# Patient Record
Sex: Female | Born: 1950
Health system: Southern US, Community
[De-identification: ages and names within clinical notes are randomized; demographics above are authoritative.]

## PROBLEM LIST (undated history)

## (undated) DIAGNOSIS — R32 Unspecified urinary incontinence: Secondary | ICD-10-CM

## (undated) DIAGNOSIS — J45909 Unspecified asthma, uncomplicated: Secondary | ICD-10-CM

## (undated) DIAGNOSIS — E559 Vitamin D deficiency, unspecified: Secondary | ICD-10-CM

## (undated) DIAGNOSIS — K219 Gastro-esophageal reflux disease without esophagitis: Secondary | ICD-10-CM

## (undated) DIAGNOSIS — K5790 Diverticulosis of intestine, part unspecified, without perforation or abscess without bleeding: Secondary | ICD-10-CM

## (undated) DIAGNOSIS — T7840XA Allergy, unspecified, initial encounter: Secondary | ICD-10-CM

## (undated) DIAGNOSIS — G459 Transient cerebral ischemic attack, unspecified: Secondary | ICD-10-CM

## (undated) DIAGNOSIS — I10 Essential (primary) hypertension: Secondary | ICD-10-CM

## (undated) DIAGNOSIS — K760 Fatty (change of) liver, not elsewhere classified: Secondary | ICD-10-CM

## (undated) DIAGNOSIS — I4891 Unspecified atrial fibrillation: Secondary | ICD-10-CM

## (undated) DIAGNOSIS — M1990 Unspecified osteoarthritis, unspecified site: Secondary | ICD-10-CM

## (undated) DIAGNOSIS — E079 Disorder of thyroid, unspecified: Secondary | ICD-10-CM

## (undated) DIAGNOSIS — M5416 Radiculopathy, lumbar region: Secondary | ICD-10-CM

## (undated) HISTORY — PX: RIGHT HEART CATH: SHX6075

## (undated) HISTORY — DX: Transient cerebral ischemic attack, unspecified: G45.9

## (undated) HISTORY — DX: Gastro-esophageal reflux disease without esophagitis: K21.9

## (undated) HISTORY — DX: Fatty (change of) liver, not elsewhere classified: K76.0

## (undated) HISTORY — PX: CHOLECYSTECTOMY: SHX55

## (undated) HISTORY — PX: CARPAL TUNNEL RELEASE: SHX101

## (undated) HISTORY — DX: Disorder of thyroid, unspecified: E07.9

## (undated) HISTORY — PX: ABLATION: SHX5711

## (undated) HISTORY — DX: Unspecified osteoarthritis, unspecified site: M19.90

## (undated) HISTORY — DX: Allergy, unspecified, initial encounter: T78.40XA

## (undated) HISTORY — PX: OTHER SURGICAL HISTORY: SHX169

## (undated) HISTORY — PX: TUBAL LIGATION: SHX77

## (undated) HISTORY — DX: Vitamin D deficiency, unspecified: E55.9

## (undated) HISTORY — DX: Diverticulosis of intestine, part unspecified, without perforation or abscess without bleeding: K57.90

## (undated) HISTORY — DX: Unspecified urinary incontinence: R32

## (undated) HISTORY — DX: Unspecified atrial fibrillation: I48.91

## (undated) HISTORY — PX: VAGINAL HYSTERECTOMY: SUR661

## (undated) HISTORY — DX: Unspecified asthma, uncomplicated: J45.909

## (undated) HISTORY — DX: Radiculopathy, lumbar region: M54.16

---

## 2000-06-30 ENCOUNTER — Emergency Department (HOSPITAL_COMMUNITY): Admission: EM | Admit: 2000-06-30 | Discharge: 2000-06-30 | Payer: Self-pay | Admitting: Emergency Medicine

## 2000-08-29 ENCOUNTER — Encounter (INDEPENDENT_AMBULATORY_CARE_PROVIDER_SITE_OTHER): Payer: Self-pay | Admitting: Family Medicine

## 2000-11-27 ENCOUNTER — Emergency Department (HOSPITAL_COMMUNITY): Admission: EM | Admit: 2000-11-27 | Discharge: 2000-11-27 | Payer: Self-pay | Admitting: *Deleted

## 2001-04-27 ENCOUNTER — Emergency Department (HOSPITAL_COMMUNITY): Admission: EM | Admit: 2001-04-27 | Discharge: 2001-04-27 | Payer: Self-pay

## 2002-03-28 ENCOUNTER — Ambulatory Visit (HOSPITAL_COMMUNITY): Admission: RE | Admit: 2002-03-28 | Discharge: 2002-03-28 | Payer: Self-pay | Admitting: Family Medicine

## 2002-03-28 ENCOUNTER — Encounter: Payer: Self-pay | Admitting: Family Medicine

## 2003-06-06 ENCOUNTER — Ambulatory Visit (HOSPITAL_COMMUNITY): Admission: RE | Admit: 2003-06-06 | Discharge: 2003-06-06 | Payer: Self-pay | Admitting: Family Medicine

## 2003-06-06 ENCOUNTER — Encounter: Payer: Self-pay | Admitting: Family Medicine

## 2003-08-05 ENCOUNTER — Emergency Department (HOSPITAL_COMMUNITY): Admission: EM | Admit: 2003-08-05 | Discharge: 2003-08-05 | Payer: Self-pay | Admitting: Emergency Medicine

## 2003-10-07 ENCOUNTER — Emergency Department (HOSPITAL_COMMUNITY): Admission: EM | Admit: 2003-10-07 | Discharge: 2003-10-07 | Payer: Self-pay | Admitting: Emergency Medicine

## 2003-11-08 ENCOUNTER — Inpatient Hospital Stay (HOSPITAL_COMMUNITY): Admission: EM | Admit: 2003-11-08 | Discharge: 2003-11-11 | Payer: Self-pay | Admitting: Emergency Medicine

## 2003-11-10 ENCOUNTER — Encounter: Payer: Self-pay | Admitting: Internal Medicine

## 2004-01-07 ENCOUNTER — Ambulatory Visit (HOSPITAL_COMMUNITY): Admission: RE | Admit: 2004-01-07 | Discharge: 2004-01-07 | Payer: Self-pay | Admitting: Family Medicine

## 2004-04-29 ENCOUNTER — Ambulatory Visit: Payer: Self-pay | Admitting: Internal Medicine

## 2004-05-20 ENCOUNTER — Ambulatory Visit: Payer: Self-pay | Admitting: Internal Medicine

## 2004-05-21 ENCOUNTER — Ambulatory Visit: Payer: Self-pay | Admitting: *Deleted

## 2004-06-25 ENCOUNTER — Ambulatory Visit: Payer: Self-pay | Admitting: Family Medicine

## 2004-09-04 ENCOUNTER — Ambulatory Visit: Payer: Self-pay | Admitting: Internal Medicine

## 2004-11-12 ENCOUNTER — Ambulatory Visit: Payer: Self-pay | Admitting: Internal Medicine

## 2004-11-18 ENCOUNTER — Ambulatory Visit: Payer: Self-pay | Admitting: Internal Medicine

## 2004-12-09 ENCOUNTER — Ambulatory Visit (HOSPITAL_COMMUNITY): Admission: RE | Admit: 2004-12-09 | Discharge: 2004-12-09 | Payer: Self-pay | Admitting: Family Medicine

## 2004-12-09 ENCOUNTER — Emergency Department (HOSPITAL_COMMUNITY): Admission: EM | Admit: 2004-12-09 | Discharge: 2004-12-09 | Payer: Self-pay | Admitting: Family Medicine

## 2006-03-01 ENCOUNTER — Emergency Department (HOSPITAL_COMMUNITY): Admission: EM | Admit: 2006-03-01 | Discharge: 2006-03-02 | Payer: Self-pay | Admitting: Internal Medicine

## 2006-12-15 ENCOUNTER — Ambulatory Visit: Payer: Self-pay | Admitting: Internal Medicine

## 2006-12-26 ENCOUNTER — Ambulatory Visit: Payer: Self-pay | Admitting: Internal Medicine

## 2007-02-01 ENCOUNTER — Ambulatory Visit: Payer: Self-pay | Admitting: Family Medicine

## 2007-02-08 ENCOUNTER — Emergency Department (HOSPITAL_COMMUNITY): Admission: EM | Admit: 2007-02-08 | Discharge: 2007-02-08 | Payer: Self-pay | Admitting: Emergency Medicine

## 2007-02-09 ENCOUNTER — Ambulatory Visit: Payer: Self-pay | Admitting: Family Medicine

## 2007-02-10 ENCOUNTER — Ambulatory Visit (HOSPITAL_COMMUNITY): Admission: RE | Admit: 2007-02-10 | Discharge: 2007-02-10 | Payer: Self-pay | Admitting: Family Medicine

## 2007-02-24 ENCOUNTER — Encounter: Admission: RE | Admit: 2007-02-24 | Discharge: 2007-02-24 | Payer: Self-pay | Admitting: Family Medicine

## 2007-05-03 ENCOUNTER — Encounter (INDEPENDENT_AMBULATORY_CARE_PROVIDER_SITE_OTHER): Payer: Self-pay | Admitting: *Deleted

## 2007-09-25 ENCOUNTER — Ambulatory Visit: Payer: Self-pay | Admitting: Nurse Practitioner

## 2007-09-25 DIAGNOSIS — I10 Essential (primary) hypertension: Secondary | ICD-10-CM | POA: Insufficient documentation

## 2007-09-25 DIAGNOSIS — IMO0002 Reserved for concepts with insufficient information to code with codable children: Secondary | ICD-10-CM | POA: Insufficient documentation

## 2007-09-25 DIAGNOSIS — R609 Edema, unspecified: Secondary | ICD-10-CM | POA: Insufficient documentation

## 2007-09-25 LAB — CONVERTED CEMR LAB
Albumin: 4.3 g/dL (ref 3.5–5.2)
CO2: 28 meq/L (ref 19–32)
Calcium: 9.3 mg/dL (ref 8.4–10.5)
Creatinine, Ser: 0.75 mg/dL (ref 0.40–1.20)
Glucose, Bld: 96 mg/dL (ref 70–99)
HCT: 38.1 % (ref 36.0–46.0)
Lymphocytes Relative: 28 % (ref 12–46)
Lymphs Abs: 1.8 10*3/uL (ref 0.7–4.0)
Monocytes Absolute: 0.4 10*3/uL (ref 0.1–1.0)
Monocytes Relative: 7 % (ref 3–12)
Neutro Abs: 4 10*3/uL (ref 1.7–7.7)
Neutrophils Relative %: 63 % (ref 43–77)
Platelets: 216 10*3/uL (ref 150–400)
Total Bilirubin: 1.1 mg/dL (ref 0.3–1.2)

## 2007-09-26 ENCOUNTER — Encounter (INDEPENDENT_AMBULATORY_CARE_PROVIDER_SITE_OTHER): Payer: Self-pay | Admitting: Nurse Practitioner

## 2007-10-10 ENCOUNTER — Ambulatory Visit: Payer: Self-pay | Admitting: Nurse Practitioner

## 2007-12-25 ENCOUNTER — Emergency Department (HOSPITAL_COMMUNITY): Admission: EM | Admit: 2007-12-25 | Discharge: 2007-12-25 | Payer: Self-pay | Admitting: Family Medicine

## 2008-04-16 ENCOUNTER — Ambulatory Visit: Payer: Self-pay | Admitting: Family Medicine

## 2008-04-16 DIAGNOSIS — N3946 Mixed incontinence: Secondary | ICD-10-CM | POA: Insufficient documentation

## 2008-04-16 LAB — CONVERTED CEMR LAB
HDL: 53 mg/dL (ref >39)
Ketones, urine, test strip: NEGATIVE
LDL Cholesterol: 74 mg/dL (ref 0–99)
Protein, U semiquant: NEGATIVE
Specific Gravity, Urine: 1.01
TSH: 1.381 microintl units/mL (ref 0.350–4.50)
Urobilinogen, UA: 0.2
VLDL: 21 mg/dL (ref 0–40)

## 2008-04-17 ENCOUNTER — Encounter: Admission: RE | Admit: 2008-04-17 | Discharge: 2008-04-17 | Payer: Self-pay | Admitting: Family Medicine

## 2008-04-17 ENCOUNTER — Encounter (INDEPENDENT_AMBULATORY_CARE_PROVIDER_SITE_OTHER): Payer: Self-pay | Admitting: Nurse Practitioner

## 2008-05-20 ENCOUNTER — Ambulatory Visit: Payer: Self-pay | Admitting: Family Medicine

## 2008-05-27 ENCOUNTER — Telehealth (INDEPENDENT_AMBULATORY_CARE_PROVIDER_SITE_OTHER): Payer: Self-pay | Admitting: Nurse Practitioner

## 2008-09-04 ENCOUNTER — Ambulatory Visit: Payer: Self-pay | Admitting: Family Medicine

## 2008-09-04 ENCOUNTER — Encounter (INDEPENDENT_AMBULATORY_CARE_PROVIDER_SITE_OTHER): Payer: Self-pay | Admitting: Family Medicine

## 2008-09-04 DIAGNOSIS — G5600 Carpal tunnel syndrome, unspecified upper limb: Secondary | ICD-10-CM | POA: Insufficient documentation

## 2008-09-04 LAB — CONVERTED CEMR LAB
Blood in Urine, dipstick: NEGATIVE
Glucose, Urine, Semiquant: NEGATIVE
Urobilinogen, UA: 1
WBC Urine, dipstick: NEGATIVE
pH: 6.5

## 2008-09-05 ENCOUNTER — Encounter (INDEPENDENT_AMBULATORY_CARE_PROVIDER_SITE_OTHER): Payer: Self-pay | Admitting: Family Medicine

## 2008-09-05 DIAGNOSIS — E039 Hypothyroidism, unspecified: Secondary | ICD-10-CM | POA: Insufficient documentation

## 2008-09-05 LAB — CONVERTED CEMR LAB
CO2: 25 meq/L (ref 19–32)
Chloride: 104 meq/L (ref 96–112)
Glucose, Bld: 106 mg/dL — ABNORMAL HIGH (ref 70–99)
Potassium: 4 meq/L (ref 3.5–5.3)
TSH: 2.196 microintl units/mL (ref 0.350–4.50)

## 2008-09-25 ENCOUNTER — Encounter (INDEPENDENT_AMBULATORY_CARE_PROVIDER_SITE_OTHER): Payer: Self-pay | Admitting: Family Medicine

## 2008-10-03 ENCOUNTER — Encounter (INDEPENDENT_AMBULATORY_CARE_PROVIDER_SITE_OTHER): Payer: Self-pay | Admitting: Family Medicine

## 2008-10-08 ENCOUNTER — Encounter (INDEPENDENT_AMBULATORY_CARE_PROVIDER_SITE_OTHER): Payer: Self-pay | Admitting: Family Medicine

## 2008-12-04 ENCOUNTER — Ambulatory Visit: Payer: Self-pay | Admitting: Family Medicine

## 2008-12-04 DIAGNOSIS — H1130 Conjunctival hemorrhage, unspecified eye: Secondary | ICD-10-CM | POA: Insufficient documentation

## 2009-02-05 ENCOUNTER — Emergency Department (HOSPITAL_COMMUNITY): Admission: EM | Admit: 2009-02-05 | Discharge: 2009-02-05 | Payer: Self-pay | Admitting: Family Medicine

## 2009-06-06 ENCOUNTER — Ambulatory Visit: Payer: Self-pay | Admitting: Nurse Practitioner

## 2009-06-06 DIAGNOSIS — J019 Acute sinusitis, unspecified: Secondary | ICD-10-CM | POA: Insufficient documentation

## 2009-07-07 ENCOUNTER — Ambulatory Visit: Payer: Self-pay | Admitting: Physician Assistant

## 2009-07-18 ENCOUNTER — Emergency Department (HOSPITAL_COMMUNITY): Admission: EM | Admit: 2009-07-18 | Discharge: 2009-07-18 | Payer: Self-pay | Admitting: Emergency Medicine

## 2009-07-23 ENCOUNTER — Ambulatory Visit: Payer: Self-pay | Admitting: Physician Assistant

## 2009-07-23 DIAGNOSIS — J45909 Unspecified asthma, uncomplicated: Secondary | ICD-10-CM | POA: Insufficient documentation

## 2009-07-23 DIAGNOSIS — J209 Acute bronchitis, unspecified: Secondary | ICD-10-CM | POA: Insufficient documentation

## 2009-09-23 ENCOUNTER — Ambulatory Visit: Payer: Self-pay | Admitting: Physician Assistant

## 2009-09-23 DIAGNOSIS — R079 Chest pain, unspecified: Secondary | ICD-10-CM | POA: Insufficient documentation

## 2009-09-23 DIAGNOSIS — A088 Other specified intestinal infections: Secondary | ICD-10-CM | POA: Insufficient documentation

## 2009-09-24 LAB — CONVERTED CEMR LAB
AST: 34 units/L (ref 0–37)
Albumin: 4.2 g/dL (ref 3.5–5.2)
Alkaline Phosphatase: 36 units/L — ABNORMAL LOW (ref 39–117)
BUN: 14 mg/dL (ref 6–23)
Basophils Absolute: 0 10*3/uL (ref 0.0–0.1)
Basophils Relative: 0 % (ref 0–1)
Creatinine, Ser: 0.87 mg/dL (ref 0.40–1.20)
Eosinophils Absolute: 0.1 10*3/uL (ref 0.0–0.7)
Eosinophils Relative: 2 % (ref 0–5)
HCT: 40.3 % (ref 36.0–46.0)
Hemoglobin: 13.2 g/dL (ref 12.0–15.0)
MCHC: 32.8 g/dL (ref 30.0–36.0)
MCV: 85.2 fL (ref 78.0–100.0)
Monocytes Relative: 11 % (ref 3–12)
Neutro Abs: 3 10*3/uL (ref 1.7–7.7)
RDW: 13.7 % (ref 11.5–15.5)
TSH: 2.371 microintl units/mL (ref 0.350–4.500)
Total Protein: 6.8 g/dL (ref 6.0–8.3)

## 2009-09-25 ENCOUNTER — Telehealth: Payer: Self-pay | Admitting: Physician Assistant

## 2009-10-01 ENCOUNTER — Ambulatory Visit (HOSPITAL_COMMUNITY): Admission: RE | Admit: 2009-10-01 | Discharge: 2009-10-01 | Payer: Self-pay | Admitting: Internal Medicine

## 2009-10-09 ENCOUNTER — Ambulatory Visit: Payer: Self-pay | Admitting: Physician Assistant

## 2009-10-10 LAB — CONVERTED CEMR LAB
ALT: 44 units/L — ABNORMAL HIGH (ref 0–35)
AST: 26 units/L (ref 0–37)
Albumin: 4.2 g/dL (ref 3.5–5.2)
Alkaline Phosphatase: 39 units/L (ref 39–117)
CO2: 29 meq/L (ref 19–32)
Calcium: 9.3 mg/dL (ref 8.4–10.5)
Sodium: 142 meq/L (ref 135–145)
Total Bilirubin: 1.7 mg/dL — ABNORMAL HIGH (ref 0.3–1.2)

## 2009-10-13 ENCOUNTER — Encounter: Payer: Self-pay | Admitting: Physician Assistant

## 2009-10-16 DIAGNOSIS — Z862 Personal history of diseases of the blood and blood-forming organs and certain disorders involving the immune mechanism: Secondary | ICD-10-CM | POA: Insufficient documentation

## 2009-10-16 DIAGNOSIS — Z8639 Personal history of other endocrine, nutritional and metabolic disease: Secondary | ICD-10-CM

## 2009-10-16 LAB — CONVERTED CEMR LAB: Indirect Bilirubin: 1.4 mg/dL — ABNORMAL HIGH (ref 0.0–0.9)

## 2009-10-17 ENCOUNTER — Ambulatory Visit: Payer: Self-pay | Admitting: Physician Assistant

## 2009-10-20 LAB — CONVERTED CEMR LAB
Eosinophils Relative: 3 % (ref 0–5)
Hemoglobin: 12.9 g/dL (ref 12.0–15.0)
Hep A Total Ab: POSITIVE — AB
Lymphocytes Relative: 28 % (ref 12–46)
MCHC: 33.9 g/dL (ref 30.0–36.0)
MCV: 83.5 fL (ref 78.0–100.0)
Monocytes Absolute: 0.4 10*3/uL (ref 0.1–1.0)
Monocytes Relative: 7 % (ref 3–12)
Neutrophils Relative %: 61 % (ref 43–77)
RBC: 4.55 M/uL (ref 3.87–5.11)
WBC: 5.4 10*3/uL (ref 4.0–10.5)

## 2009-10-21 ENCOUNTER — Ambulatory Visit: Payer: Self-pay | Admitting: Physician Assistant

## 2009-10-21 DIAGNOSIS — K219 Gastro-esophageal reflux disease without esophagitis: Secondary | ICD-10-CM | POA: Insufficient documentation

## 2009-10-21 DIAGNOSIS — J309 Allergic rhinitis, unspecified: Secondary | ICD-10-CM | POA: Insufficient documentation

## 2009-10-21 HISTORY — DX: Gastro-esophageal reflux disease without esophagitis: K21.9

## 2009-10-23 ENCOUNTER — Encounter: Payer: Self-pay | Admitting: Physician Assistant

## 2009-10-28 ENCOUNTER — Telehealth: Payer: Self-pay | Admitting: Physician Assistant

## 2009-10-29 ENCOUNTER — Ambulatory Visit (HOSPITAL_COMMUNITY): Admission: RE | Admit: 2009-10-29 | Discharge: 2009-10-29 | Payer: Self-pay | Admitting: Internal Medicine

## 2009-10-29 DIAGNOSIS — K760 Fatty (change of) liver, not elsewhere classified: Secondary | ICD-10-CM

## 2009-10-29 HISTORY — DX: Fatty (change of) liver, not elsewhere classified: K76.0

## 2009-10-31 DIAGNOSIS — K089 Disorder of teeth and supporting structures, unspecified: Secondary | ICD-10-CM | POA: Insufficient documentation

## 2009-11-05 ENCOUNTER — Telehealth: Payer: Self-pay | Admitting: Physician Assistant

## 2009-11-14 ENCOUNTER — Ambulatory Visit: Payer: Self-pay | Admitting: Physician Assistant

## 2009-11-14 ENCOUNTER — Telehealth: Payer: Self-pay | Admitting: Physician Assistant

## 2009-11-14 LAB — CONVERTED CEMR LAB
Blood Glucose, Fingerstick: 112
Cholesterol: 173 mg/dL (ref 0–200)
HDL: 56 mg/dL (ref >39)
LDL Cholesterol: 96 mg/dL (ref 0–99)
Total CHOL/HDL Ratio: 3.1

## 2009-11-17 ENCOUNTER — Encounter: Payer: Self-pay | Admitting: Physician Assistant

## 2009-12-22 ENCOUNTER — Ambulatory Visit: Payer: Self-pay | Admitting: Physician Assistant

## 2009-12-22 ENCOUNTER — Telehealth: Payer: Self-pay | Admitting: Physician Assistant

## 2009-12-22 LAB — CONVERTED CEMR LAB: KOH Prep: NEGATIVE

## 2009-12-23 ENCOUNTER — Telehealth: Payer: Self-pay | Admitting: Physician Assistant

## 2009-12-24 DIAGNOSIS — E559 Vitamin D deficiency, unspecified: Secondary | ICD-10-CM | POA: Insufficient documentation

## 2009-12-24 HISTORY — DX: Vitamin D deficiency, unspecified: E55.9

## 2009-12-24 LAB — CONVERTED CEMR LAB
ALT: 22 units/L (ref 0–35)
AST: 16 units/L (ref 0–37)
Albumin: 4.2 g/dL (ref 3.5–5.2)
Alkaline Phosphatase: 46 units/L (ref 39–117)
Calcium: 8.9 mg/dL (ref 8.4–10.5)
Chloride: 103 meq/L (ref 96–112)
Creatinine, Ser: 0.82 mg/dL (ref 0.40–1.20)
Potassium: 3.2 meq/L — ABNORMAL LOW (ref 3.5–5.3)
Sodium: 141 meq/L (ref 135–145)
Total Bilirubin: 1.5 mg/dL — ABNORMAL HIGH (ref 0.3–1.2)
Total Protein: 7 g/dL (ref 6.0–8.3)

## 2009-12-26 ENCOUNTER — Encounter: Payer: Self-pay | Admitting: Physician Assistant

## 2010-01-07 ENCOUNTER — Ambulatory Visit: Payer: Self-pay | Admitting: Physician Assistant

## 2010-01-19 LAB — CONVERTED CEMR LAB
CO2: 27 meq/L (ref 19–32)
Glucose, Bld: 100 mg/dL — ABNORMAL HIGH (ref 70–99)

## 2010-02-04 ENCOUNTER — Ambulatory Visit: Payer: Self-pay | Admitting: Physician Assistant

## 2010-02-04 LAB — CONVERTED CEMR LAB: Blood Glucose, AC Bkfst: 127 mg/dL

## 2010-02-06 LAB — CONVERTED CEMR LAB: TSH: 2.033 microintl units/mL (ref 0.350–4.500)

## 2010-02-09 ENCOUNTER — Telehealth: Payer: Self-pay | Admitting: Physician Assistant

## 2010-02-26 ENCOUNTER — Emergency Department (HOSPITAL_COMMUNITY): Admission: EM | Admit: 2010-02-26 | Discharge: 2010-02-26 | Payer: Self-pay | Admitting: Family Medicine

## 2010-03-03 ENCOUNTER — Ambulatory Visit: Payer: Self-pay | Admitting: Physician Assistant

## 2010-03-03 ENCOUNTER — Encounter: Payer: Self-pay | Admitting: Physician Assistant

## 2010-03-04 LAB — CONVERTED CEMR LAB
BUN: 15 mg/dL (ref 6–23)
CO2: 28 meq/L (ref 19–32)
Calcium: 8.7 mg/dL (ref 8.4–10.5)
Chloride: 103 meq/L (ref 96–112)
Potassium: 3.9 meq/L (ref 3.5–5.3)
Sodium: 141 meq/L (ref 135–145)

## 2010-05-13 ENCOUNTER — Telehealth: Payer: Self-pay | Admitting: Physician Assistant

## 2010-05-18 ENCOUNTER — Emergency Department (HOSPITAL_COMMUNITY): Admission: EM | Admit: 2010-05-18 | Discharge: 2010-05-18 | Payer: Self-pay | Admitting: Family Medicine

## 2010-06-09 ENCOUNTER — Telehealth (INDEPENDENT_AMBULATORY_CARE_PROVIDER_SITE_OTHER): Payer: Self-pay | Admitting: Nurse Practitioner

## 2010-09-06 ENCOUNTER — Encounter: Payer: Self-pay | Admitting: Family Medicine

## 2010-09-17 NOTE — Progress Notes (Signed)
Summary: Dental Referral  Phone Note Outgoing Call   Summary of Call: Madison Jefferson Patient would like to see if she can go to [facility] for her tooth. See my OV note from today. Thanks Initial call taken by: [name] PA-C,  November 14, 2009 11:25 AM  Follow-up for Phone Call        I will call and give the Pt the # to [facility] and explain to her the lottery process. Follow-up by: [name],  November 14, 2009 11:59 AM

## 2010-09-17 NOTE — Progress Notes (Signed)
Summary: BP meds causing headache  Phone Note Call from Patient   Summary of Call: The pt wants the provider not to prescribe her the generic medication for bp because is causing her headache and nausea. [facility] [address] [phone]) Alben Spittle PA-c Initial call taken by: Manon Hilding,  February 09, 2010 8:53 AM  Follow-up for Phone Call        Maxide started in May. Left message on answering machine to return call.  Dutch Quint RN  February 11, 2010 3:22 PM  Spoke with pt.- she states that she was given generic meds and thinks that's when her symptoms started -- was told she couldn't get brand names of her meds, but that she's not supposed to get generic meds at all.  Dutch Quint RN  February 12, 2010 9:18 AM     Additional Follow-up for Phone Call Additional follow up Details #1::        I need to know which medicine. Additional Follow-up by: [name] PA-C,  February 12, 2010 2:04 PM    Additional Follow-up for Phone Call Additional follow up Details #2::    d/c maxzide change back to HCTZ check bmet 2 weeks after taking HCTZ again Follow-up by: Brynda Rim,  February 12, 2010 2:12 PM  Additional Follow-up for Phone Call Additional follow up Details #3:: Details for Additional Follow-up Action Taken: Left message on answering machine to return call.  Dutch Quint RN  February 12, 2010 2:28 PM Left message on answering machine to return call.  Dutch Quint RN  February 13, 2010 10:36 AM  Spoke with pt. and advised of new Rx available.  Repeat BMET 03/03/10.  Dutch Quint RN  February 17, 2010 9:21 AM   New/Updated Medications: HYDROCHLOROTHIAZIDE 25 MG TABS (HYDROCHLOROTHIAZIDE) Take 1 tablet by mouth once a day for blood pressure Prescriptions: HYDROCHLOROTHIAZIDE 25 MG TABS (HYDROCHLOROTHIAZIDE) Take 1 tablet by mouth once a day for blood pressure  #30 x 5   Entered and Authorized by:   [name] PA-C   Signed by:   [name] PA-C on 02/12/2010   Method used:   Electronically to      [facility] [phone]* (retail)       [address]       [place], [place]  [address]       Ph: [phone]       Fax: [phone]   RxID:   [id number]

## 2010-09-17 NOTE — Assessment & Plan Note (Signed)
Summary: fu with Alben Spittle Pa//gk   Vital Signs:  Patient profile:   60 year old female Height:      60.25 inches Weight:      184 pounds BMI:     35.77 Temp:     97.9 degrees F oral Pulse rate:   54 / minute Pulse rhythm:   regular Resp:     18 per minute BP sitting:   137 / 85  (left arm) Cuff size:   regular  Vitals Entered By: [name] (November 14, 2009 10:55 AM) CC: pt is here for tooth pain, Hypertension Management Is Patient Diabetic? Yes CBG Result 112  Does patient need assistance? Functional Status Self care Ambulation Normal   Primary Care Provider:  [name] PA-C  CC:  pt is here for tooth pain and Hypertension Management.  History of Present Illness: Here in lab for scheduled visit. Added on to my schedule due to problems with dental pain. Can't get into clinic unless she is on antibx's. She denies fever.  Tooth feels loose.  Has to chew on opposite side at times.  No pain meds now.  Discomfort comes and goes.  No facial swelling or pain.  Has not seen a dentist in many years.  She is not taking anything for it right now.   Hypertension History:      Positive major cardiovascular risk factors include female age 42 years old or older and hypertension.  Negative major cardiovascular risk factors include negative family history for ischemic heart disease and non-tobacco-user status.    Problems Prior to Update: 1)  Dental Pain  (ICD-525.9) 2)  Fatty Liver Disease  (ICD-571.8) 3)  Gerd  (ICD-530.81) 4)  Allergic Rhinitis  (ICD-477.9) 5)  Liver Function Tests, Abnormal, Hx of  (ICD-V12.2) 6)  Gastroenteritis, Viral  (ICD-008.8) 7)  Chest Pain Unspecified  (ICD-786.50) 8)  Acute Bronchitis  (ICD-466.0) 9)  Asthma  (ICD-493.90) 10)  Acute Sinusitis, Unspecified  (ICD-461.9) 11)  Conjunctival Hemorrhage  (ICD-372.72) 12)  Hypothyroidism, Postablation  (ICD-244.8) 13)  Carpal Tunnel Syndrome, Right  (ICD-354.0) 14)  Examination, Routine Medical   (ICD-V70.0) 15)  Mixed Incontinence Urge and Stress  (ICD-788.33) 16)  Back Pain With Radiculopathy  (ICD-729.2) 17)  Leg Edema, Bilateral  (ICD-782.3) 18)  Hypertension, Benign  (ICD-401.1)  Current Medications (verified): 1)  Cozaar 100 Mg Tabs (Losartan Potassium) .... Take One (1) Tablet Each Day   Gene Applied 100mg  12/16/06 2)  Hydrochlorothiazide 25 Mg Tabs (Hydrochlorothiazide) .... Take One (1) Tablet Each Day 3)  Levothroid 88 Mcg Tabs (Levothyroxine Sodium) .... Take One (1) Tablet Each Day 4)  Toprol Xl 100 Mg Xr24h-Tab (Metoprolol Succinate) .... Take One Tablet By Mouth Daily 5)  Detrol La 4 Mg Xr24h-Cap (Tolterodine Tartrate) .... Take 1 Tablet By Mouth Once A Day For Bladder 6)  Tessalon Perles 100 Mg Caps (Benzonatate) .... One Capsule By Mouth Two Times A Day As Needed For Cough 7)  Proventil Hfa 108 (90 Base) Mcg/act Aers (Albuterol Sulfate) .... Take 2 Puffs Q2h As Needed 8)  Pepcid 20 Mg Tabs (Famotidine) .... Take 1 Tablet By Mouth Two Times A Day For Your Stomach 9)  Loratadine 10 Mg Tabs (Loratadine) .... Take 1 Tablet By Mouth Once A Day As Needed For Runny Nose  Allergies (verified): No Known Drug Allergies  Past History:  Past Medical History: Last updated: 10/29/2009 Current Problems:  HYPOTHYROIDISM s/p radioacticve iodine ablation(h/o isolated afib associated with hypothyroidism) BACK PAIN WITH  RADICULOPATHY (ICD-729.2) LEG EDEMA, BILATERAL (ICD-782.3) HYPERTENSION, BENIGN (ICD-401.1) Asthma Heart cath 2005:  minimal insig. plaque; normal LVF abd u/s 10/2009: absent gallbladder, no ductal dilatation; fatty infiltration  Physical Exam  General:  alert, well-developed, and well-nourished.   Head:  normocephalic and atraumatic.   Mouth:  pharynx pink and moist, no erythema, and fair dentition.   back left bottom molar with decay noted and tooth is easily moved back and forth in socket Neck:  supple.   Lungs:  normal breath sounds.   Heart:  normal rate  and regular rhythm.   Neurologic:  alert & oriented X3 and cranial nerves II-XII intact.   Psych:  normally interactive.     Impression & Recommendations:  Problem # 1:  FATTY LIVER DISEASE (ICD-571.8)  FLP checked today A1C 5.5 Fasting sugar a little elevated will need to keep an eye on her sugars (check every 6-12 mos)  Orders: T-Lipid Profile (0011001100) Hgb A1C (83036QW) Fingerstick ([address])  Problem # 2:  DENTAL PAIN (ICD-525.9) she has a loose tooth that is bothering her no need for pain meds or antibxs at this point dental clinic is not taking patients unless they are on antibx and pain meds she should f/u sooner if needed  she can keep calling us to see if the clinic has opened up she wants to check into going to [facility]  Problem # 3:  HYPERTENSION, BENIGN (ICD-401.1) seems controlled  Her updated medication list for this problem includes:    Cozaar 100 Mg Tabs (Losartan potassium) .[name]... Take one (1) tablet each day   gene applied 100mg  12/16/06    Hydrochlorothiazide 25 Mg Tabs (Hydrochlorothiazide) .[name]... Take one (1) tablet each day    Toprol Xl 100 Mg Xr24h-tab (Metoprolol succinate) .[name]... Take one tablet by mouth daily  Problem # 4:  Preventive Health Care (ICD-V70.0) to have CPE in May  Complete Medication List: 1)  Cozaar 100 Mg Tabs (Losartan potassium) .... Take one (1) tablet each day   gene applied 100mg  12/16/06 2)  Hydrochlorothiazide 25 Mg Tabs (Hydrochlorothiazide) .... Take one (1) tablet each day 3)  Levothroid 88 Mcg Tabs (Levothyroxine sodium) .... Take one (1) tablet each day 4)  Toprol Xl 100 Mg Xr24h-tab (Metoprolol succinate) .... Take one tablet by mouth daily 5)  Detrol La 4 Mg Xr24h-cap (Tolterodine tartrate) .... Take 1 tablet by mouth once a day for bladder 6)  Tessalon Perles 100 Mg Caps (Benzonatate) .... One capsule by mouth two times a day as needed for cough 7)  Proventil Hfa 108 (90 Base) Mcg/act Aers (Albuterol sulfate) .... Take  2 puffs q2h as needed 8)  Pepcid 20 Mg Tabs (Famotidine) .... Take 1 tablet by mouth two times a day for your stomach 9)  Loratadine 10 Mg Tabs (Loratadine) .... Take 1 tablet by mouth once a day as needed for runny nose  Hypertension Assessment/Plan:      The patient's hypertensive risk group is category B: At least one risk factor (excluding diabetes) with no target organ damage.  Her calculated 10 year risk of coronary heart disease is 7 %.  Today's blood pressure is 137/85.     Patient Instructions: 1)  We cannot get you into the dental clinic unless you have an infection.  You do not have an infection now.  If the dental clinic calls [place] and opens up appointments, we can get you in.  So, please call us periodically to see if things change.  If  you develop any swelling in your face, increased pain, fever, etc., call for a sooner follow up so I can recheck your tooth. 2)  Follow up in May as scheduled.

## 2010-09-17 NOTE — Progress Notes (Signed)
Summary: Cardiology Referral . . . appt scheduled 5.18.2011  Phone Note Outgoing Call   Summary of Call: Needs cardiology referral for chest pain. Had heart cath with Dr. Algie Coffer in 2005. Referral letter in system.  Initial call taken by: Brynda Rim,  Dec 22, 2009 5:05 PM  Follow-up for Phone Call        Pt has been sched for 12-31-09 @ 4:30pm.Pt aware of appt Follow-up by: [name],  Dec 24, 2009 3:51 PM

## 2010-09-17 NOTE — Progress Notes (Signed)
Summary: DIDNT GET REFILL ON LEVOTHROID  Phone Note Call from Patient Call back at Home Phone [phone]   Reason for Call: Refill Medication Summary of Call: WEAVER PT. MS Corella CAME BY AND SAID SHE WAS TOLD BY [facility] THAT SHE NEEDS A REFILL ON HER LEVOTHROID AND SHE HAS ONE PILL LEFT. SHE GOT ALL HER OTHER MEDS BUT THIS ONE DIDN'T  Initial call taken by: [name],  September 25, 2009 4:24 PM  Follow-up for Phone Call        forward to provider, last filled on 03-13-09 # 30 x 6 Follow-up by: [name],  September 25, 2009 4:32 PM    Prescriptions: LEVOTHROID 88 MCG TABS (LEVOTHYROXINE SODIUM) TAKE ONE (1) TABLET EACH DAY  #30 x 6   Entered and Authorized by:   [name] PA-C   Signed by:   [name] PA-C on 09/25/2009   Method used:   Faxed to ...       [facility] (retail)       [address] [place], [place]  [address]       Ph: [phone]       Fax: [phone]   RxID:   [id number]

## 2010-09-17 NOTE — Letter (Signed)
Summary: *Referral Letter  [facility]  [address] [place], [place] [address]   Phone: [phone]  Fax: [phone]    12/22/2009  Thank you in advance for agreeing to see my patient:  Madison Jefferson [address] [place], [place]  [address]  Phone: [phone]  Reason for Referral: 60 yo female with h/o HTN, FHx of CAD and cath in 2005 with minimal plaque with exertional chest pain at times.  Procedures Requested:   Current Medical Problems: 1)  DENTAL PAIN (ICD-525.9) 2)  FATTY LIVER DISEASE (ICD-571.8) 3)  GERD (ICD-530.81) 4)  ALLERGIC RHINITIS (ICD-477.9) 5)  LIVER FUNCTION TESTS, ABNORMAL, HX OF (ICD-V12.2) 6)  GASTROENTERITIS, VIRAL (ICD-008.8) 7)  CHEST PAIN UNSPECIFIED (ICD-786.50) 8)  ACUTE BRONCHITIS (ICD-466.0) 9)  ASTHMA (ICD-493.90) 10)  ACUTE SINUSITIS, UNSPECIFIED (ICD-461.9) 11)  CONJUNCTIVAL HEMORRHAGE (ICD-372.72) 12)  HYPOTHYROIDISM, POSTABLATION (ICD-244.8) 13)  CARPAL TUNNEL SYNDROME, RIGHT (ICD-354.0) 14)  EXAMINATION, ROUTINE MEDICAL (ICD-V70.0) 15)  MIXED INCONTINENCE URGE AND STRESS (ICD-788.33) 16)  BACK PAIN WITH RADICULOPATHY (ICD-729.2) 17)  LEG EDEMA, BILATERAL (ICD-782.3) 18)  HYPERTENSION, BENIGN (ICD-401.1)   Current Medications: 1)  COZAAR 100 MG TABS (LOSARTAN POTASSIUM) TAKE ONE (1) TABLET EACH DAY   GENE APPLIED 100MG  12/16/06 2)  HYDROCHLOROTHIAZIDE 25 MG TABS (HYDROCHLOROTHIAZIDE) TAKE ONE (1) TABLET EACH DAY 3)  LEVOTHROID 88 MCG TABS (LEVOTHYROXINE SODIUM) TAKE ONE (1) TABLET EACH DAY 4)  TOPROL XL 100 MG XR24H-TAB (METOPROLOL SUCCINATE) take one tablet by mouth daily 5)  DETROL LA 4 MG XR24H-CAP (TOLTERODINE TARTRATE) Take 1 tablet by mouth once a day for bladder 6)  TESSALON PERLES 100 MG CAPS (BENZONATATE) One capsule by mouth two times a day as needed for cough 7)  PROVENTIL HFA 108 (90 BASE) MCG/ACT AERS (ALBUTEROL SULFATE) take 2 puffs q2h as needed 8)  PEPCID 20 MG TABS (FAMOTIDINE) Take 1 tablet by  mouth two times a day for your stomach 9)  LORATADINE 10 MG TABS (LORATADINE) Take 1 tablet by mouth once a day as needed for runny nose   Past Medical History: 1)  Current Problems:  2)  HYPOTHYROIDISM s/p radioacticve iodine ablation(h/o isolated afib associated with hypothyroidism) 3)  BACK PAIN WITH RADICULOPATHY (ICD-729.2) 4)  LEG EDEMA, BILATERAL (ICD-782.3) 5)  HYPERTENSION, BENIGN (ICD-401.1) 6)  Asthma 7)  Heart cath 2005:  minimal insig. plaque; normal LVF 8)  abd u/s 10/2009: absent gallbladder, no ductal dilatation; fatty infiltration   Prior History of Blood Transfusions:   Pertinent Labs:    Thank you again for agreeing to see our patient; please contact us if you have any further questions or need additional information.  Sincerely,  [name] PA-C

## 2010-09-17 NOTE — Assessment & Plan Note (Signed)
Summary: bp///cns   Vital Signs:  Patient profile:   60 year old female Height:      60.25 inches Weight:      180 pounds BMI:     34.99 Temp:     97.3 degrees F oral Pulse rate:   76 / minute Pulse rhythm:   regular Resp:     18 per minute BP sitting:   126 / 82  (left arm) Cuff size:   regular  Vitals Entered By: [name] (September 23, 2009 9:37 AM) CC: bp.... pt would like to get thyroid checked today.. Is Patient Diabetic? No Pain Assessment Patient in pain? no      CBG Result 114  Does patient need assistance? Functional Status Self care Ambulation Normal   CC:  bp.... pt would like to get thyroid checked today...  History of Present Illness: Here for follow up.  Chest pain:  The patient complaint of chest pain for the last month.  She actually notes some chest discomfort with exertion back in the springtime.  This is when it was high pollen count.  She's noticed this before with her allergies.  She used to take Claritin.  Of note, she had a heart catheterization in 2005.  This was in the setting of hyper thyroidism.  She is undergone ablation of her thyroid and is now on thyroid replacement therapy.  According to the notes, it appears that she had atrial fibrillation at that time as well.  She only notes chest discomfort now at night.  It seems to wake her up.  She describes as an aching.  It only occurs with eating certain types of meals.  She does note a lot of belching and water brash type symptoms.  She has tried vinegar herself which seems to have helped.  Belching seems to help.  She has not tried any TUMS or antacids.  She currently denies any exertional chest discomfort.  She denies any radiation to her arm or jaw.  She denies any associated shortness of breath.  She denies orthopnea, paroxysmal nocturnal dyspnea or pedal edema.  She denies syncope.  She does have occasional palpitations.  Asthma History    Initial Asthma Severity Rating:    Age range: 12+  years    Symptoms: 0-2 days/week    Nighttime Awakenings: 0-2/month    Interferes w/ normal activity: no limitations    SABA use (not for EIB): 0-2 days/week    Asthma Severity Assessment: Intermittent   Current Medications (verified): 1)  Cozaar 100 Mg Tabs (Losartan Potassium) .... Take One (1) Tablet Each Day   Gene Applied 100mg  12/16/06 2)  Hydrochlorothiazide 25 Mg Tabs (Hydrochlorothiazide) .... Take One (1) Tablet Each Day 3)  Levothroid 88 Mcg Tabs (Levothyroxine Sodium) .... Take One (1) Tablet Each Day 4)  Toprol Xl 100 Mg Xr24h-Tab (Metoprolol Succinate) .... Take One Tablet By Mouth Daily 5)  Detrol La 4 Mg Xr24h-Cap (Tolterodine Tartrate) .... Take 1 Tablet By Mouth Once A Day For Bladder 6)  Tessalon Perles 100 Mg Caps (Benzonatate) .... One Capsule By Mouth Two Times A Day As Needed For Cough 7)  Proventil Hfa 108 (90 Base) Mcg/act Aers (Albuterol Sulfate) .... Take 2 Puffs Q2h As Needed  Allergies (verified): No Known Drug Allergies  Past History:  Past Surgical History: Last updated: 09/04/2008 Cholecystectomy 1994 Hysterectomy;partial 1983...vaginal bleeding. Tubal ligation 1975 s/p CTS left wrist   Past Medical History: Current Problems:  HYPOTHYROIDISM s/p radioacticve iodine ablation(h/o isolated  afib associated with hypothyroidism) BACK PAIN WITH RADICULOPATHY (ICD-729.2) LEG EDEMA, BILATERAL (ICD-782.3) HYPERTENSION, BENIGN (ICD-401.1) Asthma Heart cath 2005:  minimal insig. plaque; normal LVF  Physical Exam  General:  alert, well-developed, and well-nourished.   Head:  normocephalic and atraumatic.   Neck:  supple and no carotid bruits.   Lungs:  normal breath sounds, no crackles, and no wheezes.   Heart:  normal rate, regular rhythm, and no murmur.   Abdomen:  soft, non-tender, normal bowel sounds, and no hepatomegaly.   Neurologic:  alert & oriented X3 and cranial nerves II-XII intact.   Psych:  normally interactive.     Impression &  Recommendations:  Problem # 1:  HYPERTENSION, BENIGN (ICD-401.1) controlled Continue current meds Check labs Her updated medication list for this problem includes:    Cozaar 100 Mg Tabs (Losartan potassium) .[name]... Take one (1) tablet each day   gene applied 100mg  12/16/06    Hydrochlorothiazide 25 Mg Tabs (Hydrochlorothiazide) .[name]... Take one (1) tablet each day    Toprol Xl 100 Mg Xr24h-tab (Metoprolol succinate) .[name]... Take one tablet by mouth daily  Orders: T-Comprehensive Metabolic Panel ([id number])  Problem # 2:  HYPOTHYROIDISM, POSTABLATION (ICD-244.8) check labs  Her updated medication list for this problem includes:    Levothroid 88 Mcg Tabs (Levothyroxine sodium) .[name]... Take one (1) tablet each day  Orders: T-TSH ([id number])  Problem # 3:  EXAMINATION, ROUTINE MEDICAL (ICD-V70.0)  had pap last year . . . s/p TAH (will be due again in 3-5 years) schedule mammo schedule CPE  Orders: Mammogram (Screening) (Mammo)  Problem # 4:  CHEST PAIN UNSPECIFIED (ICD-786.50)  atypical neg heart cath in 2005 suspect 2/2 GERD add pepcid check ECG f/u in one month  if no improvement,We'll need to consider referral back to cardiology versus other workup She does describe some palpitations.  May need to consider setting her up for a Holter monitor Will assess her response to antacid therapy and her followup in one month before ordering any new tests or referrals  Orders: EKG w/ Interpretation ([address])  Problem # 5:  GASTROENTERITIS, VIRAL (ICD-008.8) resolving clear liquids, BRAT diet, etc. follow up as needed  Complete Medication List: 1)  Cozaar 100 Mg Tabs (Losartan potassium) .... Take one (1) tablet each day   gene applied 100mg  12/16/06 2)  Hydrochlorothiazide 25 Mg Tabs (Hydrochlorothiazide) .... Take one (1) tablet each day 3)  Levothroid 88 Mcg Tabs (Levothyroxine sodium) .... Take one (1) tablet each day 4)  Toprol Xl 100 Mg Xr24h-tab (Metoprolol succinate) ....  Take one tablet by mouth daily 5)  Detrol La 4 Mg Xr24h-cap (Tolterodine tartrate) .... Take 1 tablet by mouth once a day for bladder 6)  Tessalon Perles 100 Mg Caps (Benzonatate) .... One capsule by mouth two times a day as needed for cough 7)  Proventil Hfa 108 (90 Base) Mcg/act Aers (Albuterol sulfate) .... Take 2 puffs q2h as needed 8)  Pepcid 20 Mg Tabs (Famotidine) .... Take 1 tablet by mouth two times a day for your stomach  Other Orders: T-CBC w/Diff ([id number])  Patient Instructions: 1)  Schedule CPE with Jessly Lebeck in 3 months. 2)  Schedule follow up with Naziyah Tieszen in one month for chest pain. 3)  Take pepcid every day, even if you feel like your symptoms are gone. 4)  Avoid foods high in acid(tomatoes, citrus juices,spicy foods).Avoid eating within two hours of lying down or before exercising. Do not over eat: try smaller more frequent meals. Elevate head of  bed twelve inches when sleeping.  5)  The main problem with gastroentereritis is dehydration. Drink plenty of fluids and take solids as you feel better. If you are unable to keep anything down and/or you show signs of dehydration( dry cracked lips, lack of tears, not urinating, very sleepy) , call our office.  Remember BRAT diet (bananas, rice, applesauce, toast).  Eat this at first, then increase as tolerated. Prescriptions: PEPCID 20 MG TABS (FAMOTIDINE) Take 1 tablet by mouth two times a day for your stomach  #60 x 3   Entered and Authorized by:   [name] PA-C   Signed by:   [name] PA-C on 09/23/2009   Method used:   Faxed to ...       [facility] (retail)       [address] [place], [place]  [address]       Ph: [phone]       Fax: [phone]   RxID:   [id number] PROVENTIL HFA 108 (90 BASE) MCG/ACT AERS (ALBUTEROL SULFATE) take 2 puffs q2h as needed  #1 x 5   Entered and Authorized by:   [name] PA-C   Signed by:   [name] PA-C on 09/23/2009    Method used:   Faxed to ...       [facility] (retail)       [address] [place], [place]  [address]       Ph: [phone]       Fax: [phone]   RxID:   [id number]    Cardiac Cath  Procedure date:  11/11/2003  Findings:       LEFT VENTRICULOGRAM:  The left ventriculogram showed normal left ventricular  systolic function with ejection fraction of 65%.   CORONARY ANATOMY:  The left main coronary artery was unremarkable.   Left anterior descending coronary artery:  The left anterior descending  coronary artery was essentially unremarkable and it wrapped around the apex  of the heart.  The diagonal one vessel was unremarkable.  Diagonal two and  three were very small vessels.   Left circumflex coronary artery:  The left circumflex coronary artery was  dominant vessel with apparent 10-15% proximal lesion.  The rest of the  vessel was unremarkable.  Its ramus branch was normal and obtuse marginal  branch was also normal and posterior descending coronary artery was also  normal.   Right coronary artery:  The right coronary artery was small and  unremarkable.   IMPRESSION:  1. Minimal coronary artery disease.  2. Normal left ventricular systolic function.  3. Noncardiac chest pain.   RECOMMENDATIONS:  This patient may undergo noncardiac chest pain evaluation  on outpatient basis.  She will be treated medically with additional  medication for her blood pressure control.                                              Ricki Rodriguez, M.D.  EKG  Procedure date:  09/23/2009  Findings:      NSR HR 62 Normal axis PRWP nonspecific st-tw changes

## 2010-09-17 NOTE — Assessment & Plan Note (Signed)
Summary: 3 MONTH FU COR CPP///KT   Vital Signs:  Patient profile:   60 year old female Height:      60.25 inches Weight:      181 pounds BMI:     35.18 O2 Sat:      99 % on Room air Temp:     98.0 degrees F oral Pulse rate:   60 / minute Pulse rhythm:   regular Resp:     18 per minute BP sitting:   114 / 68  (left arm) Cuff size:   large  Vitals Entered By: [name] (Dec 22, 2009 9:19 AM)  O2 Flow:  Room air CC: CPP, Hypertension Management Is Patient Diabetic? No Pain Assessment Patient in pain? no       Does patient need assistance? Functional Status Self care Ambulation Normal Comments PF  1.  250    2.    270   3.   290   Primary Care Provider:  [name] PA-C  CC:  CPP and Hypertension Management.  History of Present Illness: Here for CPE.  Health Maint: No abnormal paps. No vaginal bleeding.  No dishcarge.  But, has had an odor.  Has noted for a couple years.  Thinks related to thyroid. Mammo is up to date. Td up to date. Colo due in 2012 (done in 2002 and had only fair prep). No calcium. No FHx of breast, ovarian or colon cancer. PHQ9=0 today.  Chest pain:  Had tx her for dyspepsia in the past with some relief.  She now notes other times when she has had chest pain.  She is somewhat vague about hx.  Of note, she was in the mall this past weekend and had chest pain while walking.  She describes substernal tightness.  No radiation.  She denies assoc dyspnea.  No nausea or diaphoresis.  She thought it was because she was hot.  She went downstairs to the bathroom.  It was cooler down there.  She sat on the commode for several minutes.  Resting seemed to make it better.  She also describes episode about a year ago where she was walking in the park and had chest discomfort that caused her to sit down.  She chalked it up to being out of shape and has not done anything that exertional since.  Notes times when she awakens short of breath.  Has to get up and  walk around.  Drinks a glass of water or takes an ASA.   I placed her on Pepcid in February with relief in some of her symptoms.  She has not noticed an increase in symptoms since then.  No PND or orthopnea or edema.  No syncope.    Asthma History    Asthma Control Assessment:    Age range: 12+ years    Symptoms: 0-2 days/week    Nighttime Awakenings: 0-2/month    Interferes w/ normal activity: no limitations    SABA use (not for EIB): 0-2 days/week    Asthma Control Assessment: Well Controlled  Hypertension History:      She complains of chest pain, but denies headache, dyspnea with exertion, and syncope.        Positive major cardiovascular risk factors include female age 3 years old or older and hypertension.  Negative major cardiovascular risk factors include negative family history for ischemic heart disease and non-tobacco-user status.      Problems Prior to Update: 1)  Dental Pain  (ICD-525.9)  2)  Fatty Liver Disease  (ICD-571.8) 3)  Gerd  (ICD-530.81) 4)  Allergic Rhinitis  (ICD-477.9) 5)  Liver Function Tests, Abnormal, Hx of  (ICD-V12.2) 6)  Gastroenteritis, Viral  (ICD-008.8) 7)  Chest Pain Unspecified  (ICD-786.50) 8)  Acute Bronchitis  (ICD-466.0) 9)  Asthma  (ICD-493.90) 10)  Acute Sinusitis, Unspecified  (ICD-461.9) 11)  Conjunctival Hemorrhage  (ICD-372.72) 12)  Hypothyroidism, Postablation  (ICD-244.8) 13)  Carpal Tunnel Syndrome, Right  (ICD-354.0) 14)  Examination, Routine Medical  (ICD-V70.0) 15)  Mixed Incontinence Urge and Stress  (ICD-788.33) 16)  Back Pain With Radiculopathy  (ICD-729.2) 17)  Leg Edema, Bilateral  (ICD-782.3) 18)  Hypertension, Benign  (ICD-401.1)  Current Medications (verified): 1)  Cozaar 100 Mg Tabs (Losartan Potassium) .... Take One (1) Tablet Each Day   Gene Applied 100mg  12/16/06 2)  Hydrochlorothiazide 25 Mg Tabs (Hydrochlorothiazide) .... Take One (1) Tablet Each Day 3)  Levothroid 88 Mcg Tabs (Levothyroxine Sodium) .... Take  One (1) Tablet Each Day 4)  Toprol Xl 100 Mg Xr24h-Tab (Metoprolol Succinate) .... Take One Tablet By Mouth Daily 5)  Detrol La 4 Mg Xr24h-Cap (Tolterodine Tartrate) .... Take 1 Tablet By Mouth Once A Day For Bladder 6)  Tessalon Perles 100 Mg Caps (Benzonatate) .... One Capsule By Mouth Two Times A Day As Needed For Cough 7)  Proventil Hfa 108 (90 Base) Mcg/act Aers (Albuterol Sulfate) .... Take 2 Puffs Q2h As Needed 8)  Pepcid 20 Mg Tabs (Famotidine) .... Take 1 Tablet By Mouth Two Times A Day For Your Stomach 9)  Loratadine 10 Mg Tabs (Loratadine) .... Take 1 Tablet By Mouth Once A Day As Needed For Runny Nose  Allergies (verified): No Known Drug Allergies  Past History:  Past Medical History: Last updated: 10/29/2009 Current Problems:  HYPOTHYROIDISM s/p radioacticve iodine ablation(h/o isolated afib associated with hypothyroidism) BACK PAIN WITH RADICULOPATHY (ICD-729.2) LEG EDEMA, BILATERAL (ICD-782.3) HYPERTENSION, BENIGN (ICD-401.1) Asthma Heart cath 2005:  minimal insig. plaque; normal LVF abd u/s 10/2009: absent gallbladder, no ductal dilatation; fatty infiltration  Past Surgical History: Last updated: 09/04/2008 Cholecystectomy 1994 Hysterectomy;partial 1983...vaginal bleeding. Tubal ligation 1975 s/p CTS left wrist   Family History: Mother died age 43 DM,stroke Father died age 29 DM,HTN,MI,stroke (not sure about 1st MI; died in his 58s) Sister living has DM  Social History: Reviewed history from 09/04/2008 and no changes required. Occupation:unemployed. Divorced Never Smoked Alcohol use-no Drug use-no  Review of Systems      See HPI General:  Denies chills and fever. ENT:  + bleeding gums. CV:  Complains of chest pain or discomfort; denies fainting and shortness of breath with exertion. Resp:  Denies cough. GI:  Denies bloody stools and dark tarry stools. GU:  Denies dysuria. Derm:  Denies rash. Psych:  Denies suicidal thoughts/plans. Endo:  Denies  cold intolerance and heat intolerance.  Physical Exam  General:  alert, well-developed, and well-nourished.   Head:  normocephalic and atraumatic.   Eyes:  pupils equal, pupils round, pupils reactive to light, and no optic disk abnormalities.   Ears:  R ear normal and L ear normal.   Nose:  no external deformity.   Mouth:  pharynx pink and moist, no erythema, and no exudates.   Neck:  supple, no thyromegaly, no JVD, no carotid bruits, and no cervical lymphadenopathy.   Breasts:  skin/areolae normal, no masses, no abnormal thickening, no nipple discharge, no tenderness, and no adenopathy.   Lungs:  normal breath sounds, no crackles, and no wheezes.  Heart:  normal rate, regular rhythm, and no murmur.   Abdomen:  soft, non-tender, normal bowel sounds, and no hepatomegaly.   Rectal:  deferred Genitalia:  deferred. . . s/p TAH Msk:  normal ROM.   Pulses:  R posterior tibial normal, R dorsalis pedis normal, L posterior tibial normal, and L dorsalis pedis normal.   Extremities:  no edema Neurologic:  alert & oriented X3, cranial nerves II-XII intact, strength normal in all extremities, and DTRs symmetrical and normal.   Skin:  turgor normal.   Psych:  normally interactive and good eye contact.     Impression & Recommendations:  Problem # 1:  EXAMINATION, ROUTINE MEDICAL (ICD-V70.0) discussed getting colo done now with fair prep in 2002 she prefers to wait until next year no need for pap (S/p TAH) mammo up to date Td up to date   Orders: T-HIV Antibody  (Reflex) [id number]) T-Urinalysis ([id number]) T-Vitamin D (25-Hydroxy) [phone]) KOH/ WET Mount [phone]) Hemoccult Cards -3 specimans (take home) ([address])  Problem # 2:  DENTAL PAIN (ICD-525.9)  will try to get her in to the dental clinic  Orders: Dental Referral (Dentist)  Problem # 3:  CHEST PAIN UNSPECIFIED (ICD-786.50) still suspect she has more dyspepsia than anything no cardiac eval since 2005 (minimal plaque  at that time at cath) refer to card . . . for possible stress test take ASA once daily cont pepcid  Orders: Cardiology Referral (Cardiology)  Problem # 4:  FATTY LIVER DISEASE (ICD-571.8) f/u on LFTS  Orders: T-Comprehensive Metabolic Panel ([id number])  Problem # 5:  GERD (ICD-530.81) refill pepcid  Her updated medication list for this problem includes:    Pepcid 20 Mg Tabs (Famotidine) .[name]... Take 1 tablet by mouth two times a day for your stomach  Problem # 6:  ASTHMA (ICD-493.90) controlled  Her updated medication list for this problem includes:    Proventil Hfa 108 (90 Base) Mcg/act Aers (Albuterol sulfate) .[name]... Take 2 puffs q2h as needed  Problem # 7:  HYPOTHYROIDISM, POSTABLATION (ICD-244.8) TFTs optimal in Feb 2011  Her updated medication list for this problem includes:    Levothroid 88 Mcg Tabs (Levothyroxine sodium) .[name]... Take one (1) tablet each day  Problem # 8:  HYPERTENSION, BENIGN (ICD-401.1)  controlled  Her updated medication list for this problem includes:    Cozaar 100 Mg Tabs (Losartan potassium) .[name]... Take one (1) tablet each day   gene applied 100mg  12/16/06    Hydrochlorothiazide 25 Mg Tabs (Hydrochlorothiazide) .[name]... Take one (1) tablet each day    Toprol Xl 100 Mg Xr24h-tab (Metoprolol succinate) .[name]... Take one tablet by mouth daily  Orders: T-Comprehensive Metabolic Panel ([id number]) T-Urinalysis ([id number])  Complete Medication List: 1)  Cozaar 100 Mg Tabs (Losartan potassium) .... Take one (1) tablet each day   gene applied 100mg  12/16/06 2)  Hydrochlorothiazide 25 Mg Tabs (Hydrochlorothiazide) .... Take one (1) tablet each day 3)  Levothroid 88 Mcg Tabs (Levothyroxine sodium) .... Take one (1) tablet each day 4)  Toprol Xl 100 Mg Xr24h-tab (Metoprolol succinate) .... Take one tablet by mouth daily 5)  Detrol La 4 Mg Xr24h-cap (Tolterodine tartrate) .... Take 1 tablet by mouth once a day for bladder 6)  Tessalon Perles 100 Mg Caps  (Benzonatate) .... One capsule by mouth two times a day as needed for cough 7)  Proventil Hfa 108 (90 Base) Mcg/act Aers (Albuterol sulfate) .... Take 2 puffs q2h as needed 8)  Pepcid 20 Mg Tabs (Famotidine) .... Take 1 tablet  by mouth two times a day for your stomach 9)  Loratadine 10 Mg Tabs (Loratadine) .... Take 1 tablet by mouth once a day as needed for runny nose 10)  Aspirin 81 Mg Tabs (Aspirin) .... Take 1 tablet by mouth once a day  Hypertension Assessment/Plan:      The patient's hypertensive risk group is category B: At least one risk factor (excluding diabetes) with no target organ damage.  Her calculated 10 year risk of coronary heart disease is 4 %.  Today's blood pressure is 114/68.    Patient Instructions: 1)  Take Aspirin 81 mg once daily. 2)  We will try to get you into the dental clinic. 3)  Someone will call you about the referral to the heart doctor. 4)  Please schedule a follow-up appointment in 3 months with Mory Herrman for blood pressure.  Prescriptions: PEPCID 20 MG TABS (FAMOTIDINE) Take 1 tablet by mouth two times a day for your stomach  #60 x 5   Entered and Authorized by:   [name] PA-C   Signed by:   [name] PA-C on 12/22/2009   Method used:   Faxed to ...       [facility] (retail)       [address] [place], [place]  [address]       Ph: [phone]       Fax: [phone]   RxID:   [id number]   Laboratory Results  Date/Time Received: Dec 22, 2009 11:30 AM   [employer] Source: vaginal WBC/hpf: 1-5 Bacteria/hpf: rare Clue cells/hpf: none  Negative whiff Yeast/hpf: none Wet Mount KOH: Negative Trichomonas/hpf: none  Other Tests  Rapid HIV: negative    Appended Document: 3 MONTH FU COR CPP///KT/hemmoccult results     Allergies: No Known Drug Allergies   Complete Medication List: 1)  Cozaar 100 Mg Tabs (Losartan potassium) .... Take one (1) tablet each day   gene applied 100mg   12/16/06 2)  Levothyroxine Sodium 88 Mcg Tabs (Levothyroxine sodium) .... Take 1 tablet by mouth once a day 3)  Toprol Xl 100 Mg Xr24h-tab (Metoprolol succinate) .... Take one tablet by mouth daily 4)  Detrol La 4 Mg Xr24h-cap (Tolterodine tartrate) .... Take 1 tablet by mouth once a day for bladder 5)  Tessalon Perles 100 Mg Caps (Benzonatate) .... One capsule by mouth two times a day as needed for cough 6)  Proventil Hfa 108 (90 Base) Mcg/act Aers (Albuterol sulfate) .... Take 2 puffs q2h as needed 7)  Pepcid 20 Mg Tabs (Famotidine) .... Take 1 tablet by mouth two times a day for your stomach 8)  Loratadine 10 Mg Tabs (Loratadine) .... Take 1 tablet by mouth once a day as needed for runny nose 9)  Aspirin 81 Mg Tabs (Aspirin) .... Take 1 tablet by mouth once a day 10)  Maxzide-25 37.5-25 Mg Tabs (Triamterene-hctz) .... Take 1 tablet by mouth once a day for blood pressure (pharmacy note that hctz has been changed to maxzide) 11)  Ergocalciferol 50000 Unit Caps (Ergocalciferol) .... Take one cap once per week for 12 weeks   Laboratory Results  Date/Time Received: Dec 26, 2009 4:08 PM   Stool - Occult Blood Hemmoccult #1: negative Date: 12/26/2009 Hemoccult #2: negative Date: 12/26/2009 Hemoccult #3: negative Date: 12/26/2009

## 2010-09-17 NOTE — Letter (Signed)
Summary: NUTRITION SUMMARY  NUTRITION SUMMARY   Imported By: Arta Bruce 03/30/2010 14:59:00  _____________________________________________________________________  External Attachment:    Type:   Image     Comment:   External Document

## 2010-09-17 NOTE — Letter (Signed)
Summary: TEST ORDER FORM//ULTRASOUND/APPT DATE & TIME  TEST ORDER FORM//ULTRASOUND/APPT DATE & TIME   Imported By: Arta Bruce 12/23/2009 15:03:08  _____________________________________________________________________  External Attachment:    Type:   Image     Comment:   External Document

## 2010-09-17 NOTE — Progress Notes (Signed)
Summary: Dental referrral  Update  Phone Note From Other Clinic Call back at Home Phone [phone]   Reason for Call: Acute Illness Summary of Call: Madison Jefferson pt. MS Harpole STOPPED BY AND WANTS TO KOW IF SHE CAN GET A DENTAL REFERRAL. SHE HAS A TOOTH ON THE BACK BOTTOM RIGHT THAT THE FILLING HAS FELL OUT AND THE WISDOM TOOTHE IS PUSHING UP ON IT AND SHE IS HAVING A LOT OF PAIN. Initial call taken by: [name],  October 28, 2009 8:26 AM Caller: Referral Coordinator Summary of Call: Pt can not be referred to the Adult Dental Clinic if she is not on Pain Meds & Antibotics.They WILL NOT accept the referral if those two requirements are not met.If Pt still wants to go to the Dentist she would need to shop around because we have no other resources for dental. Initial call taken by: [name],  November 04, 2009 9:06 AM  Follow-up for Phone Call        Left message on answering machine for pt to call back.Marland KitchenMarland KitchenArmenia Jefferson  October 29, 2009 4:19 PM   spoke with pt and she says she can not eat on the right side and she says if anything gets in the tooth it hurts.. pt says her tooth is loose and very tender....  pt is aware of the dental referral has a hold on waiting list so pt says she will go anywhere...  Madison, Please notify patient. If she is having more pain or swelling or fever, set up appt with me. Follow-up by: [name] PA-C,  November 04, 2009 11:48 AM  Additional Follow-up for Phone Call Additional follow up Details #1::        Is she taking anything for pain? If so, put this on referral and see if it will get her in to dental clinic any sooner. Additional Follow-up by: [name] PA-C,  October 30, 2009 12:43 PM  New Problems: DENTAL PAIN (ICD-525.9)   Additional Follow-up for Phone Call Additional follow up Details #2::    spoke with pt and she says she is not in any pain... she has been taking aspirin and just not eating on that side..Madison Jefferson  October 31, 2009 11:40  AM   Additional Follow-up for Phone Call Additional follow up Details #3:: Details for Additional Follow-up Action Taken: [facility]. Referral in system.  ok will send debra phone note.. Madison Jefferson  November 05, 2009 10:53 AM  Additional Follow-up by: [name] PA-C,  October 31, 2009 1:24 PM  New Problems: DENTAL PAIN (ICD-525.9)    Impression & Recommendations:  Problem # 1:  DENTAL PAIN (ICD-525.9)  Orders: Dental Referral (Dentist)  Complete Medication List: 1)  Cozaar 100 Mg Tabs (Losartan potassium) .... Take one (1) tablet each day   gene applied 100mg  12/16/06 2)  Hydrochlorothiazide 25 Mg Tabs (Hydrochlorothiazide) .... Take one (1) tablet each day 3)  Levothroid 88 Mcg Tabs (Levothyroxine sodium) .... Take one (1) tablet each day 4)  Toprol Xl 100 Mg Xr24h-tab (Metoprolol succinate) .... Take one tablet by mouth daily 5)  Detrol La 4 Mg Xr24h-cap (Tolterodine tartrate) .... Take 1 tablet by mouth once a day for bladder 6)  Tessalon Perles 100 Mg Caps (Benzonatate) .... One capsule by mouth two times a day as needed for cough 7)  Proventil Hfa 108 (90 Base) Mcg/act Aers (Albuterol sulfate) .... Take 2 puffs q2h as needed 8)  Pepcid 20 Mg Tabs (Famotidine) .... Take 1 tablet by  mouth two times a day for your stomach 9)  Loratadine 10 Mg Tabs (Loratadine) .... Take 1 tablet by mouth once a day as needed for runny nose

## 2010-09-17 NOTE — Assessment & Plan Note (Signed)
Summary: 1 MONTH FU ON CHEST PAINS//KT   Vital Signs:  Patient profile:   60 year old female Height:      60.25 inches Weight:      185 pounds BMI:     35.96 Temp:     97.9 degrees F oral Pulse rate:   70 / minute Pulse rhythm:   regular Resp:     18 per minute BP sitting:   158 / 80  (left arm) Cuff size:   regular  Vitals Entered By: [name] (October 21, 2009 10:10 AM)  Serial Vital Signs/Assessments:  Time      Position  BP       Pulse  Resp  Temp     By 10:40 AM            138/80                         [name] PA-C  CC: one month f/u on chest pains.... pt says she is doing a lot better....  Is Patient Diabetic? No Pain Assessment Patient in pain? no       Does patient need assistance? Functional Status Self care Ambulation Normal   CC:  one month f/u on chest pains.... pt says she is doing a lot better.... .  History of Present Illness: Here for f/u on chest pain.  Chest pain:  Started on pepcid last visit.  Has not had any CP since starting med.  No dysphagia.  NO melena or hematochezia.  No water brash.  No hematemesis.  Elevated bilirubin:  Probably Gilbert's.  All lab work has been ok.  Ultrasound is still pending.  URI:  Recent.  No fevers.  Feels like she is getting better.  Slight cough.  Nonproductive.  Still having a lot of nasal drainage.  Current Medications (verified): 1)  Cozaar 100 Mg Tabs (Losartan Potassium) .... Take One (1) Tablet Each Day   Gene Applied 100mg  12/16/06 2)  Hydrochlorothiazide 25 Mg Tabs (Hydrochlorothiazide) .... Take One (1) Tablet Each Day 3)  Levothroid 88 Mcg Tabs (Levothyroxine Sodium) .... Take One (1) Tablet Each Day 4)  Toprol Xl 100 Mg Xr24h-Tab (Metoprolol Succinate) .... Take One Tablet By Mouth Daily 5)  Detrol La 4 Mg Xr24h-Cap (Tolterodine Tartrate) .... Take 1 Tablet By Mouth Once A Day For Bladder 6)  Tessalon Perles 100 Mg Caps (Benzonatate) .... One Capsule By Mouth Two Times A Day As Needed For  Cough 7)  Proventil Hfa 108 (90 Base) Mcg/act Aers (Albuterol Sulfate) .... Take 2 Puffs Q2h As Needed 8)  Pepcid 20 Mg Tabs (Famotidine) .... Take 1 Tablet By Mouth Two Times A Day For Your Stomach  Allergies (verified): No Known Drug Allergies  Physical Exam  General:  alert, well-developed, and well-nourished.   Head:  normocephalic and atraumatic.   Eyes:  no injection.   Ears:  R ear normal and L ear normal.   Nose:  no external erythema and no nasal discharge.   Mouth:  pharynx pink and moist.   Neck:  supple and no cervical lymphadenopathy.   Lungs:  normal breath sounds, no crackles, and no wheezes.   Heart:  normal rate and regular rhythm.   Abdomen:  soft, non-tender, normal bowel sounds, and no hepatomegaly.   Neurologic:  alert & oriented X3 and cranial nerves II-XII intact.   Psych:  normally interactive.     Impression & Recommendations:  Problem #  1:  ALLERGIC RHINITIS (ICD-477.9)  trial of claritin as needed  Her updated medication list for this problem includes:    Loratadine 10 Mg Tabs (Loratadine) .[name]... Take 1 tablet by mouth once a day as needed for runny nose  Problem # 2:  CHEST PAIN UNSPECIFIED (ICD-786.50) related to GERD resolved on H2RA continue pepcid  Problem # 3:  HYPERTENSION, BENIGN (ICD-401.1) repeat bp better pt just took meds  Her updated medication list for this problem includes:    Cozaar 100 Mg Tabs (Losartan potassium) .[name]... Take one (1) tablet each day   gene applied 100mg  12/16/06    Hydrochlorothiazide 25 Mg Tabs (Hydrochlorothiazide) .[name]... Take one (1) tablet each day    Toprol Xl 100 Mg Xr24h-tab (Metoprolol succinate) .[name]... Take one tablet by mouth daily  Complete Medication List: 1)  Cozaar 100 Mg Tabs (Losartan potassium) .... Take one (1) tablet each day   gene applied 100mg  12/16/06 2)  Hydrochlorothiazide 25 Mg Tabs (Hydrochlorothiazide) .... Take one (1) tablet each day 3)  Levothroid 88 Mcg Tabs (Levothyroxine sodium)  .... Take one (1) tablet each day 4)  Toprol Xl 100 Mg Xr24h-tab (Metoprolol succinate) .... Take one tablet by mouth daily 5)  Detrol La 4 Mg Xr24h-cap (Tolterodine tartrate) .... Take 1 tablet by mouth once a day for bladder 6)  Tessalon Perles 100 Mg Caps (Benzonatate) .... One capsule by mouth two times a day as needed for cough 7)  Proventil Hfa 108 (90 Base) Mcg/act Aers (Albuterol sulfate) .... Take 2 puffs q2h as needed 8)  Pepcid 20 Mg Tabs (Famotidine) .... Take 1 tablet by mouth two times a day for your stomach 9)  Loratadine 10 Mg Tabs (Loratadine) .... Take 1 tablet by mouth once a day as needed for runny nose  Patient Instructions: 1)  Follow up in May as scheduled for CPE with Vontrell Pullman. 2)  Claritin was sent to the [facility]. Pharmacy as well as your Losartan, Metoprolol, HCTZ. Prescriptions: TOPROL XL 100 MG XR24H-TAB (METOPROLOL SUCCINATE) take one tablet by mouth daily  #30 x 6   Entered and Authorized by:   [name] PA-C   Signed by:   [name] PA-C on 10/21/2009   Method used:   Faxed to ...       [facility] - [facility] (retail)       [address] [place], [place]  [address]       Ph: [phone]       Fax: [phone]   RxID:   [id number] COZAAR 100 MG TABS (LOSARTAN POTASSIUM) TAKE ONE (1) TABLET EACH DAY   GENE APPLIED 100MG  12/16/06  #30 x 6   Entered and Authorized by:   [name] PA-C   Signed by:   [name] PA-C on 10/21/2009   Method used:   Faxed to ...       [facility] - Pharmac (retail)       [address] [place], [place]  [address]       Ph: [phone]       Fax: [phone]   RxID:   [id number] HYDROCHLOROTHIAZIDE 25 MG TABS (HYDROCHLOROTHIAZIDE) TAKE ONE (1) TABLET EACH DAY  #30 x 6   Entered and Authorized by:   [name] PA-C   Signed by:   [name] PA-C on 10/21/2009   Method used:   Faxed to .Marland KitchenMarland Kitchen  [facility] -  Pharmac (retail)       [address] [place], [place]  [address]       Ph: [phone]       Fax: [phone]   RxID:   [id number] LORATADINE 10 MG TABS (LORATADINE) Take 1 tablet by mouth once a day as needed for runny nose  #30 x 2   Entered and Authorized by:   [name] PA-C   Signed by:   [name] PA-C on 10/21/2009   Method used:   Faxed to ...       [facility] - [facility] (retail)       [address] [place], [place]  [address]       Ph: [phone]       Fax: [phone]   RxID:   [id number]

## 2010-09-17 NOTE — Progress Notes (Signed)
Summary: Office Visit//DEPRRESSION SCREENING  Office Visit//DEPRRESSION SCREENING   Imported By: Arta Bruce 02/17/2010 15:21:06  _____________________________________________________________________  External Attachment:    Type:   Image     Comment:   External Document

## 2010-09-17 NOTE — Progress Notes (Signed)
Summary: Query:  Refill Losartan?  Phone Note Outgoing Call   Summary of Call: Do you want to refill her Losartan?  Last seen 12/2009. Initial call taken by: Dutch Quint RN,  June 09, 2010 2:06 PM  Follow-up for Phone Call        ok to refill Follow-up by: Lehman Prom FNP,  June 10, 2010 8:28 AM  Additional Follow-up for Phone Call Additional follow up Details #1::        Rx refilled.  Dutch Quint RN  June 10, 2010 11:36 AM     Prescriptions: COZAAR 100 MG TABS (LOSARTAN POTASSIUM) TAKE ONE (1) TABLET EACH DAY   GENE APPLIED 100MG  12/16/06  #30 x 3   Entered by:   Dutch Quint RN   Authorized by:   Lehman Prom FNP   Signed by:   Dutch Quint RN on 06/10/2010   Method used:   Electronically to        [facility] [phone]* (retail)       [address]       [place], [place]  [address]       Ph: [phone]       Fax: [phone]   RxID:   [id number]

## 2010-09-17 NOTE — Letter (Signed)
Summary: *HSN Results Follow up  [facility]  [address] [place], [place] [address]   Phone: [phone]  Fax: [phone]      11/17/2009   SHETARA LAUNER [address] [place], [place]  [address]   Dear  Ms. Antionetta Mayabb,                            ____S.Drinkard,FNP   ____D. Gore,FNP       ____B. McPherson,MD   ____V. Rankins,MD    ____E. Mulberry,MD    ____N. Daphine Deutscher, FNP  ____D. Reche [place], MD    ____K. Philipp Deputy, MD    __x__S.Alben Spittle, PA-C   This letter is to inform you that your recent test(s):  _______Pap Smear    ____x___Lab Test     _______X-ray    ____x___ is within acceptable limits  _______ requires a medication change  _______ requires a follow-up lab visit  _______ requires a follow-up visit with your provider   Comments: Cholesterol numbers look good.       _________________________________________________________ If you have any questions, please contact our office                     Sincerely,  [name] PA-C [facility]

## 2010-09-17 NOTE — Progress Notes (Signed)
  Phone Note Outgoing Call   Summary of Call: Patient needs appt with lab in 4-6 weeks for TSH. Initial call taken by: Brynda Rim,  Dec 23, 2009 5:36 PM  Follow-up for Phone Call        Left message on answering machine for pt to call back.Marland KitchenMarland KitchenMarland KitchenArmenia Shannon  Dec 24, 2009 8:41 AM  pt is aware Follow-up by: [name],  Dec 24, 2009 11:19 AM    New/Updated Medications: LEVOTHYROXINE SODIUM 88 MCG TABS (LEVOTHYROXINE SODIUM) Take 1 tablet by mouth once a day

## 2010-09-17 NOTE — Letter (Signed)
Summary: *HSN Results Follow up  [facility]  [address] [place], [place] [address]   Phone: [phone]  Fax: [phone]      12/26/2009   Caly WYOMA GENSON [address] [place], [place]  [address]   Dear  Ms. Karrissa Barretta,                            ____S.Drinkard,FNP   ____D. Gore,FNP       ____B. McPherson,MD   ____V. Rankins,MD    ____E. Mulberry,MD    ____N. Daphine Deutscher, FNP  ____D. Reche [place], MD    ____K. Philipp Deputy, MD    __x__S. Alben Spittle, PA-C     This letter is to inform you that your recent test(s):  _______Pap Smear    _______Lab Test     _______X-ray    _______ is within acceptable limits  _______ requires a medication change  _______ requires a follow-up lab visit  _______ requires a follow-up visit with your provider   Comments: Stool cards negative for blood.       _________________________________________________________ If you have any questions, please contact our office                     Sincerely,  [name] PA-C [facility]

## 2010-09-17 NOTE — Letter (Signed)
Summary: DENTAL REFERRAL  DENTAL REFERRAL   Imported By: Arta Bruce 12/23/2009 09:34:02  _____________________________________________________________________  External Attachment:    Type:   Image     Comment:   External Document

## 2010-09-17 NOTE — Progress Notes (Signed)
Summary: Dental update  Phone Note Call from Patient   Summary of Call: Madison Jefferson this pt has a dental referral in the system... pt has been taking aspirin and can not chew but on one side Initial call taken by: [name],  November 05, 2009 10:58 AM  Follow-up for Phone Call        Pt  needs to be on pain meds & antibotic in order to be referred.Is this the case? Follow-up by: [name],  November 05, 2009 1:01 PM  Additional Follow-up for Phone Call Additional follow up Details #1::        No. See 3/15 phone note. [place],  If she is having more pain or facial swelling, set up appt with me to see her. Notify her about the dental situation. Additional Follow-up by: [name] PA-C,  November 05, 2009 4:30 PM    Additional Follow-up for Phone Call Additional follow up Details #2::    pt is aware and has lab appt on march 31 Follow-up by: [name],  November 06, 2009 2:41 PM   Appended Document: Dental update If the Pt is not on an Antibotic and Pain meds the referral will not be accepted.Is the Pt on BOTH?   the pt is not on both... so the pt can not be referred...[name] pt is coming in for a lab appt on March 31... [name]  November 10, 2009 11:21 AM

## 2010-09-17 NOTE — Progress Notes (Signed)
Summary: Query:  Refill levothyroxine?  Phone Note Refill Request Message from:  Patient  Refills Requested: Medication #1:  LEVOTHYROXINE SODIUM 88 MCG TABS Take 1 tablet by mouth once a day please send rx to [facility] [address]  Initial call taken by: Oscar La,  May 13, 2010 2:02 PM  Follow-up for Phone Call        Do you want to refill her levothyroxine?  Last seen 12/22/09. Follow-up by: Dutch Quint RN,  May 13, 2010 3:40 PM  Additional Follow-up for Phone Call Additional follow up Details #1::        Rx noted, pt. notified.  Dutch Quint RN  May 14, 2010 9:09 AM     Prescriptions: LEVOTHYROXINE SODIUM 88 MCG TABS (LEVOTHYROXINE SODIUM) Take 1 tablet by mouth once a day  #30 x 11   Entered and Authorized by:   [name] PA-C   Signed by:   [name] PA-C on 05/13/2010   Method used:   Electronically to        [facility] [phone]* (retail)       [address]       [place], [place]  [address]       Ph: [phone]       Fax: [phone]   RxID:   [id number]

## 2010-10-20 ENCOUNTER — Telehealth (INDEPENDENT_AMBULATORY_CARE_PROVIDER_SITE_OTHER): Payer: Self-pay | Admitting: Nurse Practitioner

## 2010-10-27 NOTE — Progress Notes (Signed)
Summary: Query:  Refill losartan?  Phone Note Outgoing Call   Summary of Call: Last seen 12/2009.  No f/u scheduled.  Refill losartan per protocol? Initial call taken by: Dutch Quint RN,  October 20, 2010 5:40 PM  Follow-up for Phone Call        yes, ok to fill notify pt that she needs to schedule an appt in may 2012 for routine f/u Follow-up by: Lehman Prom FNP,  October 21, 2010 8:36 AM  Additional Follow-up for Phone Call Additional follow up Details #1::        Noted.  Refill completed.  Pt. notified, appt. scheduled 12/14/10. Additional Follow-up by: Dutch Quint RN,  October 21, 2010 9:19 AM

## 2010-12-21 ENCOUNTER — Other Ambulatory Visit: Payer: Self-pay | Admitting: Physician Assistant

## 2011-01-01 NOTE — Discharge Summary (Signed)
NAME:  Madison Jefferson, Madison Jefferson                            ACCOUNT NO.:  [id number]   MEDICAL RECORD NO.:  [id number]                   PATIENT TYPE:  INP   LOCATION:  3734                                 FACILITY:  [facility]   PHYSICIAN:  Ricki Rodriguez, M.D.               DATE OF BIRTH:  May 28, 1951   DATE OF ADMISSION:  11/10/2003  DATE OF DISCHARGE:  11/11/2003                                 DISCHARGE SUMMARY   The patient was transferred from [facility].   PRINCIPAL/FINAL DIAGNOSIS:  1. Coronary atherosclerosis of native coronary vessels.  2. Chest pain.  3. Hypertension.   DISCHARGE MEDICATIONS:  1. 30 mg 1 daily.  2. Lisinopril-HCTZ 20/25 one daily.  3. Cozaar 100 mg one daily.  4. Toprol XL 100 mg one daily.  5. Methimazole 10 mg 1.5 tablets daily.   DISCHARGE ACTIVITIES:  No lifting, pulling or pushing x2 days.   DIET:  Low fat, low salt diet.   WOUND CARE INSTRUCTIONS:  The patient is to notify of pain, swelling or  discharge.   FOLLOW UP:  Dr. Algie Coffer in 2 weeks.  The patient to call for an appointment,  and call Dr. Tresa Endo in 1 month.   HISTORY:  This is a 60 year old African-American female who was a  consultation for retrosternal chest heaviness increased with activity,  occasionally sharp pain lasting for a few seconds.  The patient reported  switching medication from Cozaar to Avapro giving her the chest pain.  She  has a long-standing history of hypertension for 29 years and has a positive  family history of premature coronary artery disease in her father who died  with an MI at age 20.   PHYSICAL EXAMINATION:  VITAL SIGNS:  Temperature 98. pulse 77, respiration  16, blood pressure 174/81.  Height 5 feet, 1 inch.  Weight 148 pounds.  GENERAL:  The patient was alert and oriented x3.  HEENT:  Head was normocephalic, atraumatic.  Eyes brown.  Pupils are equal,  round and reactive to light.  Extraocular movements are intact. Nose and  throat unremarkable.  Mucous membranes pink and moist.  NECK:  Supple, full range of motion.  LUNGS:  Clear with mild right sided tenderness.  HEART:  Normal S1, S2.  ABDOMEN:  Soft.  EXTREMITIES:  No clubbing, cyanosis or edema.  CNS:  Bilaterally equal grips.   LABORATORY DATA:  Normal hemoglobin, hematocrit, WBC count and platelet  count.  Normal electrolytes, BUN, creatinine and a potassium of 3.2.  CK-MB  normal.  EKG:  Normal sinus rhythm with atrial enlargement and possible  anterior infarction.   Cardiac catheterization showed minimal coronary artery disease in left  circumflex coronary artery, otherwise unremarkable with a normal left  ventricular systolic function.  Echocardiogram showed overall normal left  systolic function.  Chest x-ray did not reveal any evidence of acute  disease.  Thyroid function  test was suggestive of mild  hyperthyroidism.  Serum aldosterone was normal.   HOSPITAL COURSE:  The patient was admitted to a telemetry unit where  myocardial infarction was ruled out.  She had normal ultrasound of the  heart.  The patient was given the option to try medical therapy and go for a  cardiac catheterization on inpatient versus outpatient basis.  Due to strong  family history of coronary artery disease and typical chest pain the patient  agreed to a cardiac catheterization which showed minimal coronary artery  disease hence the patient was discharged home in satisfactory condition with  a non cardiac chest pain evaluation on an inpatient basis.                                                Ricki Rodriguez, M.D.    ASK/MEDQ  D:  12/25/2003  T:  12/26/2003  Job:  [id number]

## 2011-01-01 NOTE — Cardiovascular Report (Signed)
NAME:  Madison Jefferson, Madison Jefferson                            ACCOUNT NO.:  [id number]   MEDICAL RECORD NO.:  [id number]                   PATIENT TYPE:  INP   LOCATION:  3734                                 FACILITY:  [facility]   PHYSICIAN:  Ricki Rodriguez, M.D.               DATE OF BIRTH:  [birth year]   DATE OF PROCEDURE:  11/11/2003  DATE OF DISCHARGE:                              CARDIAC CATHETERIZATION   PROCEDURE:  1. Left heart catheterization.  2. Selective coronary angiography.  3. Left ventricular function study.   INDICATIONS:  This 60 year old black female had typical chest pain like  someone sitting on her along with abnormal EKG and family history of  coronary artery disease.   APPROACH:  Right femoral artery using 5 French sheath and catheters.   COMPLICATIONS:  None.   HEMODYNAMIC DATA:  The left ventricular pressure was 160/13/30 and aortic  pressure was 161/75/112.   LEFT VENTRICULOGRAM:  The left ventriculogram showed normal left ventricular  systolic function with ejection fraction of 65%.   CORONARY ANATOMY:  The left main coronary artery was unremarkable.   Left anterior descending coronary artery:  The left anterior descending  coronary artery was essentially unremarkable and it wrapped around the apex  of the heart.  The diagonal one vessel was unremarkable.  Diagonal two and  three were very small vessels.   Left circumflex coronary artery:  The left circumflex coronary artery was  dominant vessel with apparent 10-15% proximal lesion.  The rest of the  vessel was unremarkable.  Its ramus branch was normal and obtuse marginal  branch was also normal and posterior descending coronary artery was also  normal.   Right coronary artery:  The right coronary artery was small and  unremarkable.   IMPRESSION:  1. Minimal coronary artery disease.  2. Normal left ventricular systolic function.  3. Noncardiac chest pain.   RECOMMENDATIONS:  This patient may undergo  noncardiac chest pain evaluation  on outpatient basis.  She will be treated medically with additional  medication for her blood pressure control.                                              Ricki Rodriguez, M.D.   ASK/MEDQ  D:  11/11/2003  T:  11/11/2003  Job:  [id number]

## 2011-01-01 NOTE — H&P (Signed)
NAME:  Madison Jefferson, Madison Jefferson                            ACCOUNT NO.:  [id number]   MEDICAL RECORD NO.:  [id number]                   PATIENT TYPE:  INP   LOCATION:  3734                                 FACILITY:  [facility]   PHYSICIAN:  Melissa L. Ladona Ridgel, MD               DATE OF BIRTH:  [birth year]   DATE OF ADMISSION:  11/10/2003  DATE OF DISCHARGE:  11/11/2003                                HISTORY & PHYSICAL   PRIMARY CARE PHYSICIAN:  Dr. Tresa Endo at [facility].   CHIEF COMPLAINT:  Chest pain.   HISTORY OF PRESENT ILLNESS:  This is a 60 year old female with known  hypertension, thyroid disease which is hyper in nature.  Two nights prior to  admission, the patient developed chest pain while lying in bed.  It was 8 to  9 out of 10 located in the midsternum with the sensation of someone stepping  on her chest.  There was no radiation, no diaphoresis, no shortness of  breath.  The patient has been having chest pain associated with shortness of  breath on and off for some time.  She states that sitting up helps and  drinking water helps.  The pain was off and on up until the time she arrived  in the ED.  When the pain was not relieved at home, she came to the  emergency room and she did find some relief with a nitroglycerin drip.  She  denies nausea, vomiting, reflux, fever, chills, or diarrhea.   PAST MEDICAL HISTORY:  She had a stress test about 10 years prior with Dr.  Shana Chute.  She has had back problems and she is seeing Dr. Alto Denver for those.  She has hypertension and an overactive thyroid.   PAST SURGICAL HISTORY:  She has had a hysterectomy, carpal tunnel.   SOCIAL HISTORY:  She denies tobacco, ethanol, or illicit drug use.  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   ALLERGIES:  No known drug allergies.   MEDICATIONS:  1. Lisinopril/hydrochlorothiazide combination 10/25 mg daily.  2. Methimazole 1.5 tablets of a  10 mg tablet daily.  3. Toprol-XL 100 mg daily.  4. Cozaar 100 mg daily.   FAMILY HISTORY:  Mom is living with diabetes.  Dad is deceased secondary to  an MI at 54.  She has 3 kids and is single.   REVIEW OF SYSTEMS:  As per the HPI.  She has had some weight gain secondary  to planned decreased p.o. intake.  All other review of systems are negative.   In the emergency room, the patient received aspirin, nitroglycerin  sublingually multiple times, potassium repletion, Toprol 25 mg p.o., and  placement of nitroglycerin drip.   PHYSICAL EXAMINATION:  VITAL SIGNS:  Temperature is 98.6, blood pressure  132/61, pulse 67, respiratory rate of 12.  Saturation 100%.  GENERAL:  She is in no acute distress.  HEENT:  Her pupils equal and round, reactive to light.  Extraocular muscles  are intact.  Mucous membranes are moist.  NECK:  Supple.  There is no JVD or lymph nodes.  CHEST:  Clear to auscultation.  CARDIOVASCULAR:  Regular rate and rhythm.  Positive S1 & S2.  No S3 or S4.  ABDOMEN:  Soft, nontender, nondistended, with positive bowel sounds.  EXTREMITIES:  Show no edema.  NEUROLOGICAL:  She is nonfocal.   LABORATORY DATA ON ADMISSION:  A white count of 7.4, hemoglobin of 14,  hematocrit of 41.5, platelets 360,000.  Sodium 137 with a potassium of 3.2  which was repleted, chloride is 104, CO2 27, BUN 2, creatinine 0.6 and  glucose 126.  Her first and second set of enzymes were negative.  Her EKG  showed normal sinus rhythm, no acute EKG changes but there is a question of  possible lateral ischemia.   ASSESSMENT:  This is a 60 year old female with uncontrolled hypertension,  chest pain which is controlled with a nitroglycerin drip.   PLAN:  1. Cardiovascular--we will resume her Lopressor, ACE, hydrochlorothiazide     and hold her Cozaar.  We will continue her aspirin and follow up her     cardiac enzymes as well as check a TSH, replete her potassium p.r.n. and     consult Dr. Magda Kiel  service for a possible stress test versus cath.  2. Pulmonary--there is no active disease or symptoms.  3. GI--Protonix 40 mg daily.  4. GU--there are no current complaints.  5. Endocrine--we will check her TSH and continue her methimazole.  6. We will consult Dr. Magda Kiel office as stated.  7. She will be started on Lovenox for DVT prophylaxis which can be increased     to ACS protocol should she redevelop chest discomfort.                                               Melissa L. Ladona Ridgel, MD    MLT/MEDQ  D:  11/11/2003  T:  11/11/2003  Job:  [id number]

## 2011-02-16 ENCOUNTER — Other Ambulatory Visit (HOSPITAL_COMMUNITY): Payer: Self-pay | Admitting: Family Medicine

## 2011-02-16 DIAGNOSIS — Z1231 Encounter for screening mammogram for malignant neoplasm of breast: Secondary | ICD-10-CM

## 2011-02-23 ENCOUNTER — Ambulatory Visit (HOSPITAL_COMMUNITY)
Admission: RE | Admit: 2011-02-23 | Discharge: 2011-02-23 | Disposition: A | Discharge status: Home / Self Care | Payer: Self-pay | Source: Ambulatory Visit | Attending: Family Medicine | Admitting: Family Medicine

## 2011-02-23 DIAGNOSIS — Z1231 Encounter for screening mammogram for malignant neoplasm of breast: Secondary | ICD-10-CM | POA: Insufficient documentation

## 2011-03-18 ENCOUNTER — Inpatient Hospital Stay (INDEPENDENT_AMBULATORY_CARE_PROVIDER_SITE_OTHER)
Admission: RE | Admit: 2011-03-18 | Discharge: 2011-03-18 | Disposition: A | Discharge status: Home / Self Care | Payer: Self-pay | Source: Ambulatory Visit | Attending: Family Medicine | Admitting: Family Medicine

## 2011-03-18 DIAGNOSIS — M79609 Pain in unspecified limb: Secondary | ICD-10-CM

## 2011-03-18 DIAGNOSIS — R05 Cough: Secondary | ICD-10-CM

## 2011-05-20 ENCOUNTER — Inpatient Hospital Stay (INDEPENDENT_AMBULATORY_CARE_PROVIDER_SITE_OTHER)
Admission: RE | Admit: 2011-05-20 | Discharge: 2011-05-20 | Disposition: A | Discharge status: Home / Self Care | Payer: Self-pay | Source: Ambulatory Visit | Attending: Family Medicine | Admitting: Family Medicine

## 2011-05-20 DIAGNOSIS — T148XXA Other injury of unspecified body region, initial encounter: Secondary | ICD-10-CM

## 2011-05-20 DIAGNOSIS — W19XXXA Unspecified fall, initial encounter: Secondary | ICD-10-CM

## 2011-06-02 LAB — CBC
Hemoglobin: 14.1
RBC: 5.01
WBC: 7.1

## 2011-06-02 LAB — I-STAT 8, (EC8 V) (CONVERTED LAB)
Acid-Base Excess: 5 — ABNORMAL HIGH
Bicarbonate: 31.8 — ABNORMAL HIGH
Operator id: 282201
Sodium: 138
TCO2: 33
pCO2, Ven: 51.8 — ABNORMAL HIGH

## 2011-06-02 LAB — URINE MICROSCOPIC-ADD ON

## 2011-06-02 LAB — DIFFERENTIAL
Basophils Relative: 0
Eosinophils Absolute: 0.1
Neutrophils Relative %: 62

## 2011-06-02 LAB — POCT I-STAT CREATININE
Creatinine, Ser: 0.9
Operator id: 282201

## 2011-06-02 LAB — URINALYSIS, ROUTINE W REFLEX MICROSCOPIC
Bilirubin Urine: NEGATIVE
Hgb urine dipstick: NEGATIVE
Ketones, ur: NEGATIVE
Specific Gravity, Urine: 1.012
Urobilinogen, UA: 2 — ABNORMAL HIGH
pH: 7.5

## 2011-12-26 ENCOUNTER — Emergency Department (INDEPENDENT_AMBULATORY_CARE_PROVIDER_SITE_OTHER): Payer: Self-pay

## 2011-12-26 ENCOUNTER — Encounter (HOSPITAL_COMMUNITY): Payer: Self-pay

## 2011-12-26 ENCOUNTER — Emergency Department (HOSPITAL_COMMUNITY)
Admission: EM | Admit: 2011-12-26 | Discharge: 2011-12-26 | Disposition: A | Discharge status: Home / Self Care | Payer: Self-pay | Source: Home / Self Care | Attending: Family Medicine | Admitting: Family Medicine

## 2011-12-26 DIAGNOSIS — S20229A Contusion of unspecified back wall of thorax, initial encounter: Secondary | ICD-10-CM

## 2011-12-26 DIAGNOSIS — W108XXA Fall (on) (from) other stairs and steps, initial encounter: Secondary | ICD-10-CM

## 2011-12-26 HISTORY — DX: Essential (primary) hypertension: I10

## 2011-12-26 MED ORDER — NAPROXEN 375 MG PO TABS: 375.0000 mg | ORAL_TABLET | Freq: Two times a day (BID) | ORAL | Status: DC

## 2011-12-26 NOTE — ED Provider Notes (Signed)
History     CSN: [id number]  Arrival date & time 12/26/11  1001   None     Chief Complaint  Patient presents with  . Fall    (Consider location/radiation/quality/duration/timing/severity/associated sxs/prior treatment) HPI Comments: Pt was walking down stairs in the dark last night around midnight, stepped on a shoe that was on the stairs, slipped and fell backward, landing on lower back on stairs and sliding down stairs.  Also c/o pain to knees and wrists/forearms, but pain mostly in her lower back. Denies hitting head, denies LOC.  Feels a little tingling in feet.  Denies numbness or weakness  Patient is a 61 y.o. female presenting with fall. The history is provided by the patient.  Fall The accident occurred 12 to 24 hours ago. The fall occurred while walking (while walking down stairs). There was no blood loss. Point of impact: lower back, knees and wrist/forearms. Pain location: lower back, knees, forearm/wrist. The pain is at a severity of 6/10. The pain is moderate. She was ambulatory at the scene. Associated symptoms include tingling. Pertinent negatives include no numbness, no vomiting, no headaches and no loss of consciousness. She has tried nothing for the symptoms.    Past Medical History  Diagnosis Date  . Hypertension     Past Surgical History  Procedure Date  . Vaginal hysterectomy   . Cholecystectomy     History reviewed. No pertinent family history.  History  Substance Use Topics  . Smoking status: Never Smoker   . Smokeless tobacco: Not on file  . Alcohol Use: No    OB History    Grav Para Term Preterm Abortions TAB SAB Ect Mult Living                  Review of Systems  Gastrointestinal: Negative for vomiting.  Musculoskeletal: Positive for back pain.       Knee and wrist/forearm pain  Skin: Positive for color change. Negative for wound.  Neurological: Positive for tingling. Negative for dizziness, loss of consciousness, weakness, numbness and  headaches.  Hematological: Does not bruise/bleed easily.    Allergies  Review of patient's allergies indicates no known allergies.  Home Medications   Current Outpatient Rx  Name Route Sig Dispense Refill  . ALBUTEROL SULFATE HFA 108 (90 BASE) MCG/ACT IN AERS Inhalation Inhale 2 puffs into the lungs every 6 (six) hours as needed.    [name] HYDROCHLOROTHIAZIDE 25 MG PO TABS Oral Take 25 mg by mouth daily.    [name] LEVOTHYROXINE SODIUM 100 MCG PO TABS Oral Take 100 mcg by mouth daily.    [name] LORATADINE 10 MG PO TABS Oral Take 10 mg by mouth daily.    [name] LOSARTAN POTASSIUM 100 MG PO TABS Oral Take 100 mg by mouth daily.    [name] METOPROLOL SUCCINATE ER 100 MG PO TB24 Oral Take 100 mg by mouth daily. Take with or immediately following a meal.    . NAPROXEN 375 MG PO TABS Oral Take 1 tablet (375 mg total) by mouth 2 (two) times daily. 20 tablet 0    BP 160/80  Pulse 61  Temp(Src) 98.6 F (37 C) (Oral)  Resp 16  SpO2 97%  Physical Exam  Constitutional: She is oriented to person, place, and time. She appears well-developed and well-nourished. No distress.  HENT:  Head: Normocephalic and atraumatic.  Pulmonary/Chest: Effort normal.  Musculoskeletal:       Right wrist: She exhibits normal range of motion.  Left wrist: She exhibits normal range of motion.       Right hip: She exhibits normal range of motion.       Left hip: She exhibits normal range of motion.       Right knee: She exhibits normal range of motion, no swelling, no effusion, no deformity and no erythema. no tenderness found.       Left knee: She exhibits normal range of motion, no swelling, no effusion, no deformity and no erythema. no tenderness found.       Lumbar back: She exhibits tenderness and bony tenderness. She exhibits no swelling, no edema and no deformity.  Neurological: She is alert and oriented to person, place, and time. No sensory deficit. Gait normal.       Sensation in B feet grossly intact    ED Course    Procedures (including critical care time)  Labs Reviewed - No data to display Dg Lumbar Spine Complete  12/26/2011  *RADIOLOGY REPORT*  Clinical Data: Fall with low back pain.  LUMBAR SPINE - COMPLETE 4+ VIEW  Comparison: MRI of the lumbar spine dated 01/07/2004  Findings: No acute fracture or subluxation identified. Moderate degree of facet hypertrophy noted at L4-5 and L5-S1.  Disc space heights are well preserved without significant narrowing.  No bony lesions are identified.  IMPRESSION: No acute fracture.  Facet hypertrophy at L4-5 and L5-S1.  Original Report Authenticated By: Reola Calkins, M.D.     1. Contusion of back   2. Fall down stairs       MDM  No acute findings from fall on xray.  Discussed xray finding with pt; pt does report frequent back pain and sometimes tingling/numbness in LE, radiating pain.  Pt to talk with pcp about this and report if sx worsen.  Reviewed fall prevention.         Cathlyn Parsons, NP 12/26/11 1221

## 2011-12-26 NOTE — ED Provider Notes (Signed)
Medical screening examination/treatment/procedure(s) were performed by resident physician or non-physician practitioner and as supervising physician I was immediately available for consultation/collaboration.   Barkley Bruns MD.    Linna Hoff, MD 12/26/11 1726

## 2011-12-26 NOTE — Discharge Instructions (Signed)
If you are able, soaking in warm bath with epsom salt in it at least once a day.  This will help relieve the muscle aches and injury from your fall.  If your back pain continues to bother you or you feel it is getting worse, talk with your doctor about it.   Fall Prevention  Falls cause injuries and can affect all age groups. It is possible to prevent falls.  HOME CARE  Wear shoes with non-slip soles (not slippers).   Have your home and outside area well lit.   Use night lights throughout your home.   Remove clutter.   Clean up floor spills.   Remove throw rugs or fasten them to the floor with carpet tape.   Do not place electrical cords across pathways.   Put grab bars by your tub, shower, and toilet. Do not use towel bars as grab bars.   Put handrails on both sides of the stairway.   Do not climb on stools or stepladders.   Do not wax your floors.   Repair uneven or unsafe sidewalks, walkways, or stairs.   Keep items you use a lot within reach.   Be aware of pets.   Change positions slowly.   Sit up on the side of your bed for a few minutes before standing. This prevents dizziness.   Keep a bedside commode by your bed at night.   If you have a cane or walker, be sure to use it whenever you are walking.   Have your eyes and hearing checked every year.  Ask your doctor what other things you can do to prevent falls. GET HELP RIGHT AWAY IF:  You feel dizzy, weak, or unsteady on your feet.   You feel confused.   You fall and hurt yourself.  Document Released: 05/29/2009 Document Revised: 07/22/2011 Document Reviewed: 05/29/2009 [facility] Patient Information 2012 [place], [place].

## 2011-12-26 NOTE — ED Notes (Signed)
Pt fell last pm when going down stairs, she has bilateral knee soreness and low back pain.

## 2012-01-27 ENCOUNTER — Encounter (HOSPITAL_COMMUNITY): Payer: Self-pay | Admitting: Respiratory Therapy

## 2012-01-28 ENCOUNTER — Encounter (HOSPITAL_COMMUNITY)
Admission: AD | Disposition: A | Discharge status: Home / Self Care | Payer: Self-pay | Source: Ambulatory Visit | Attending: Cardiovascular Disease

## 2012-01-28 ENCOUNTER — Ambulatory Visit (HOSPITAL_COMMUNITY)
Admission: AD | Admit: 2012-01-28 | Discharge: 2012-01-28 | Disposition: A | Discharge status: Home / Self Care | Payer: Self-pay | Source: Ambulatory Visit | Attending: Cardiovascular Disease | Admitting: Cardiovascular Disease

## 2012-01-28 DIAGNOSIS — R079 Chest pain, unspecified: Secondary | ICD-10-CM | POA: Insufficient documentation

## 2012-01-28 DIAGNOSIS — I10 Essential (primary) hypertension: Secondary | ICD-10-CM | POA: Insufficient documentation

## 2012-01-28 DIAGNOSIS — R9439 Abnormal result of other cardiovascular function study: Secondary | ICD-10-CM | POA: Insufficient documentation

## 2012-01-28 DIAGNOSIS — R0602 Shortness of breath: Secondary | ICD-10-CM | POA: Insufficient documentation

## 2012-01-28 HISTORY — PX: LEFT HEART CATHETERIZATION WITH CORONARY ANGIOGRAM: SHX5451

## 2012-01-28 SURGERY — LEFT HEART CATHETERIZATION WITH CORONARY ANGIOGRAM
Anesthesia: LOCAL

## 2012-01-28 MED ORDER — SODIUM CHLORIDE 0.9 % IJ SOLN: 3.0000 mL | INTRAMUSCULAR | Status: DC | PRN

## 2012-01-28 MED ORDER — SODIUM CHLORIDE 0.9 % IV SOLN: 1.0000 mL/kg/h | INTRAVENOUS | Status: DC

## 2012-01-28 MED ORDER — DIAZEPAM 5 MG PO TABS: 5.0000 mg | ORAL_TABLET | ORAL | Status: DC | PRN

## 2012-01-28 MED ORDER — SODIUM CHLORIDE 0.9 % IV SOLN: 250.0000 mL | INTRAVENOUS | Status: DC | PRN

## 2012-01-28 MED ORDER — ACETAMINOPHEN 325 MG PO TABS
ORAL_TABLET | ORAL | Status: AC
  Administered 2012-01-28: 650 mg via ORAL
  Filled 2012-01-28: qty 2

## 2012-01-28 MED ORDER — NITROGLYCERIN 0.2 MG/ML ON CALL CATH LAB
INTRAVENOUS | Status: AC
  Filled 2012-01-28: qty 1

## 2012-01-28 MED ORDER — LIDOCAINE HCL (PF) 1 % IJ SOLN
INTRAMUSCULAR | Status: AC
  Filled 2012-01-28: qty 30

## 2012-01-28 MED ORDER — SODIUM CHLORIDE 0.9 % IJ SOLN: 3.0000 mL | Freq: Two times a day (BID) | INTRAMUSCULAR | Status: DC

## 2012-01-28 MED ORDER — METHYLPREDNISOLONE SODIUM SUCC 125 MG IJ SOLR
INTRAMUSCULAR | Status: AC
  Filled 2012-01-28: qty 2

## 2012-01-28 MED ORDER — DIPHENHYDRAMINE HCL 50 MG/ML IJ SOLN
INTRAMUSCULAR | Status: AC
  Filled 2012-01-28: qty 1

## 2012-01-28 MED ORDER — OXYCODONE-ACETAMINOPHEN 5-325 MG PO TABS: 1.0000 | ORAL_TABLET | ORAL | Status: DC | PRN

## 2012-01-28 MED ORDER — SODIUM CHLORIDE 0.9 % IV SOLN
INTRAVENOUS | Status: DC
  Administered 2012-01-28: 07:00:00 via INTRAVENOUS

## 2012-01-28 MED ORDER — DIAZEPAM 5 MG PO TABS
5.0000 mg | ORAL_TABLET | ORAL | Status: AC
  Administered 2012-01-28: 5 mg via ORAL
  Filled 2012-01-28: qty 1

## 2012-01-28 MED ORDER — ASPIRIN 81 MG PO CHEW
324.0000 mg | CHEWABLE_TABLET | ORAL | Status: AC
  Administered 2012-01-28: 324 mg via ORAL
  Filled 2012-01-28: qty 4

## 2012-01-28 MED ORDER — ACETAMINOPHEN 325 MG PO TABS
650.0000 mg | ORAL_TABLET | ORAL | Status: DC | PRN
  Administered 2012-01-28: 650 mg via ORAL

## 2012-01-28 MED ORDER — ONDANSETRON HCL 4 MG/2ML IJ SOLN: 4.0000 mg | Freq: Four times a day (QID) | INTRAMUSCULAR | Status: DC | PRN

## 2012-01-28 MED ORDER — ONDANSETRON HCL 4 MG/2ML IJ SOLN
INTRAMUSCULAR | Status: AC
  Filled 2012-01-28: qty 2

## 2012-01-28 MED ORDER — FENTANYL CITRATE 0.05 MG/ML IJ SOLN
INTRAMUSCULAR | Status: AC
  Filled 2012-01-28: qty 2

## 2012-01-28 MED ORDER — HEPARIN (PORCINE) IN NACL 2-0.9 UNIT/ML-% IJ SOLN
INTRAMUSCULAR | Status: AC
  Filled 2012-01-28: qty 2000

## 2012-01-28 MED ORDER — MIDAZOLAM HCL 2 MG/2ML IJ SOLN
INTRAMUSCULAR | Status: AC
  Filled 2012-01-28: qty 2

## 2012-01-28 NOTE — CV Procedure (Signed)
PROCEDURE:  Left heart catheterization with selective coronary angiography, left ventriculogram.  CLINICAL HISTORY:  This is a 61 years old black female with shortness of breath and EKG changes of ischemia on stress test..  The risks, benefits, and details of the procedure were explained to the patient.  The patient verbalized understanding and wanted to proceed.  Informed written consent was obtained.  PROCEDURE TECHNIQUE:  The patient was approached from the right femoral artery using a 5 French short sheath.  Left coronary angiography was done using a Judkins L4 guide catheter.  Right coronary angiography was done using a Judkins R4 guide catheter.  Left ventriculography was done using a pigtail catheter.    CONTRAST:  Total of 35 cc.  COMPLICATIONS:  None.  At the end of the procedure a manual device was used for hemostasis.    HEMODYNAMICS:  Aortic pressure was 144/77 ; LV pressure was 147/9; LVEDP 17.  There was no gradient between the left ventricle and aorta.  Fluid bolus was used for initial low bp of 71/46.  ANGIOGRAM/CORONARY ARTERIOGRAM:   The left main coronary artery is normal.  The left anterior descending artery and diagonal artery are unremarkable.  The left circumflex artery is dominant and unremarkable. Ramus and OM branch are also unremarkable.  The right coronary artery is small and unremarkable.  LEFT VENTRICULOGRAM:  Left ventricular angiogram was done in the 30 RAO projection and revealed normal left ventricular wall motion and systolic function with an estimated ejection fraction of 70%.  LVEDP was 17 mmHg.  IMPRESSION OF HEART CATHETERIZATION:   1. Normal left main coronary artery. 2. Normal left anterior descending artery and its branches. 3. Normal left circumflex artery and its branches. 4. Normal right coronary artery. 5. Normal left ventricular systolic function.  LVEDP 17 mmHg.  Ejection fraction 70 %.  RECOMMENDATION:   Medical treatment.[name]

## 2012-01-28 NOTE — Discharge Instructions (Signed)

## 2012-01-28 NOTE — H&P (Signed)
AVALIE OCONNOR is an 61 y.o. female.   Chief Complaint: Occasional chest pain.  HPI: 51 years ols black female with occasional chest pain had abnormal stress test with inferior ischemia  Past Medical History  Diagnosis Date  . Hypertension       Past Surgical History  Procedure Date  . Vaginal hysterectomy   . Cholecystectomy     No family history on file. Social History:  reports that she has never smoked. She does not have any smokeless tobacco history on file. She reports that she does not drink alcohol or use illicit drugs.  Allergies:  Allergies  Allergen Reactions  . Oysters (Shellfish Allergy) Rash    Medications Prior to Admission  Medication Sig Dispense Refill  . aspirin 325 MG tablet Take 325 mg by mouth daily.      . hydrochlorothiazide (HYDRODIURIL) 25 MG tablet Take 25 mg by mouth daily.      [name] levothyroxine (SYNTHROID, LEVOTHROID) 100 MCG tablet Take 100 mcg by mouth daily.      [name] loratadine (CLARITIN) 10 MG tablet Take 10 mg by mouth daily.      [name] losartan (COZAAR) 100 MG tablet Take 100 mg by mouth daily.      . metoprolol succinate (TOPROL-XL) 100 MG 24 hr tablet Take 100 mg by mouth daily. Take with or immediately following a meal.      . naproxen (NAPROSYN) 375 MG tablet Take 1 tablet (375 mg total) by mouth 2 (two) times daily.  20 tablet  0  . Vitamin D, Ergocalciferol, (DRISDOL) 50000 UNITS CAPS Take 50,000 Units by mouth every 7 (seven) days. Take on tuesday      . albuterol (PROVENTIL HFA;VENTOLIN HFA) 108 (90 BASE) MCG/ACT inhaler Inhale 2 puffs into the lungs every 6 (six) hours as needed.        H/H-12.1/36.7, WBC-4.9, Plt-197. Na/K-141/3.8, BUN/Cr-12/0.93.  EKG-NSR, LAE.  @ROS @ + Weight gain, Chest pain, Shortness of breath, palpitations, mini-stroke, Joint pains. No hepatitis, kidney stone, seizures or psych admissions.  Blood pressure 177/80, pulse 56, temperature 97.8 F (36.6 C), temperature source Oral, resp. rate 18, height 5' (1.524 m),  weight 84.823 kg (187 lb), SpO2 97.00%. General appearance: alert, cooperative and appears stated age Head: Normocephalic, without obvious abnormality, atraumatic Eyes: Brown eyes, conjunctivae/corneas clear. PERRL, EOM's intact. Wears glasses. Neck: no adenopathy, no carotid bruit, no JVD, supple, symmetrical, trachea midline and thyroid not enlarged, symmetric. Resp: clear to auscultation bilaterally Cardio: regular rate and rhythm, S1, S2 normal, no murmur, click, rub or gallop Extremities: extremities normal, atraumatic, no cyanosis or edema Neurology: AxOx3. Moves all 4 ext.  Assessment/Plan Chest pain. Inferior wall ischemia/Stress test.  Patient understood risk, benefits and alternatives. C. Cath today.  Jailine Lieder S 01/28/2012, 6:43 AM

## 2012-07-03 ENCOUNTER — Encounter: Payer: Self-pay | Admitting: Internal Medicine

## 2012-07-03 ENCOUNTER — Ambulatory Visit (INDEPENDENT_AMBULATORY_CARE_PROVIDER_SITE_OTHER): Payer: Self-pay | Admitting: Internal Medicine

## 2012-07-03 VITALS — BP 192/80 | HR 59 | Temp 97.8°F | Ht 61.0 in | Wt 197.8 lb

## 2012-07-03 DIAGNOSIS — E559 Vitamin D deficiency, unspecified: Secondary | ICD-10-CM

## 2012-07-03 DIAGNOSIS — G5600 Carpal tunnel syndrome, unspecified upper limb: Secondary | ICD-10-CM

## 2012-07-03 DIAGNOSIS — K219 Gastro-esophageal reflux disease without esophagitis: Secondary | ICD-10-CM

## 2012-07-03 DIAGNOSIS — E039 Hypothyroidism, unspecified: Secondary | ICD-10-CM

## 2012-07-03 DIAGNOSIS — J309 Allergic rhinitis, unspecified: Secondary | ICD-10-CM

## 2012-07-03 DIAGNOSIS — R079 Chest pain, unspecified: Secondary | ICD-10-CM

## 2012-07-03 DIAGNOSIS — I10 Essential (primary) hypertension: Secondary | ICD-10-CM

## 2012-07-03 DIAGNOSIS — Z1211 Encounter for screening for malignant neoplasm of colon: Secondary | ICD-10-CM | POA: Insufficient documentation

## 2012-07-03 DIAGNOSIS — Z23 Encounter for immunization: Secondary | ICD-10-CM

## 2012-07-03 DIAGNOSIS — E890 Postprocedural hypothyroidism: Secondary | ICD-10-CM

## 2012-07-03 DIAGNOSIS — E038 Other specified hypothyroidism: Secondary | ICD-10-CM

## 2012-07-03 DIAGNOSIS — J45909 Unspecified asthma, uncomplicated: Secondary | ICD-10-CM

## 2012-07-03 DIAGNOSIS — Z0000 Encounter for general adult medical examination without abnormal findings: Secondary | ICD-10-CM

## 2012-07-03 LAB — GLUCOSE, CAPILLARY: Glucose-Capillary: 103 mg/dL — ABNORMAL HIGH (ref 70–99)

## 2012-07-03 MED ORDER — LOSARTAN POTASSIUM 100 MG PO TABS: 100.0000 mg | ORAL_TABLET | Freq: Every day | ORAL | Status: DC

## 2012-07-03 MED ORDER — VITAMIN D (ERGOCALCIFEROL) 1.25 MG (50000 UNIT) PO CAPS: 50000.0000 [IU] | ORAL_CAPSULE | ORAL | Status: DC

## 2012-07-03 MED ORDER — HYDROCHLOROTHIAZIDE 25 MG PO TABS: 25.0000 mg | ORAL_TABLET | Freq: Every day | ORAL | Status: DC

## 2012-07-03 MED ORDER — LORATADINE 10 MG PO TABS: 10.0000 mg | ORAL_TABLET | Freq: Every day | ORAL | Status: DC

## 2012-07-03 MED ORDER — ALBUTEROL SULFATE HFA 108 (90 BASE) MCG/ACT IN AERS: 2.0000 | INHALATION_SPRAY | Freq: Four times a day (QID) | RESPIRATORY_TRACT | Status: DC | PRN

## 2012-07-03 MED ORDER — METOPROLOL SUCCINATE ER 100 MG PO TB24: 100.0000 mg | ORAL_TABLET | Freq: Every day | ORAL | Status: DC

## 2012-07-03 MED ORDER — NITROGLYCERIN 0.4 MG SL SUBL: 0.4000 mg | SUBLINGUAL_TABLET | SUBLINGUAL | Status: DC | PRN

## 2012-07-03 MED ORDER — ASPIRIN 325 MG PO TABS: 325.0000 mg | ORAL_TABLET | Freq: Every day | ORAL | Status: DC

## 2012-07-03 MED ORDER — LEVOTHYROXINE SODIUM 100 MCG PO TABS: 100.0000 ug | ORAL_TABLET | Freq: Every day | ORAL | Status: DC

## 2012-07-03 NOTE — Assessment & Plan Note (Signed)
Rx refill albuterol inhaler

## 2012-07-03 NOTE — Assessment & Plan Note (Signed)
S/p surgery not problematic

## 2012-07-03 NOTE — Assessment & Plan Note (Addendum)
Uncontrolled BP today 151/71 with elevations of sbp upon repeat 170s-190s Patient is asymptomatic, no neurological deficits Cont Cozaar 100 mg qd, HCTZ 25 mg daily, Toprol XL 100 mg qd-Rx refills of all  Pending BMET F/u 1 month for HTN

## 2012-07-03 NOTE — Assessment & Plan Note (Signed)
Resolved currently  Left heart cath 01/2012 neg for CAD with EF 70% Rx refilled NTG prn Follow with Dr. Algie Coffer

## 2012-07-03 NOTE — Assessment & Plan Note (Signed)
Rx refill 50K units q week

## 2012-07-03 NOTE — Assessment & Plan Note (Signed)
Rx refill Nasonex and Loratidine  Min ttp maxillary and ethmoid sinuses but patient is afebrile

## 2012-07-03 NOTE — Patient Instructions (Addendum)
Follow up in 1 month Please take your prescriptions to the pharmacy Once you get the orange card approved you can go to [facility]. Pharmacy Please proceed with [facility]  Vitamin D Deficiency  Not having enough vitamin D is called a deficiency. Your body needs this vitamin to keep your bones strong and healthy. Having too little of it can make your bones soft or can cause other health problems.  HOME CARE  Take all vitamins, herbs, or nutrition drinks (supplements) as told by your doctor.  Have your blood tested 2 months after taking vitamins, herbs, or nutrition drinks.  Eat foods that have vitamin D. This includes:  Dairy products, cereals, or juices with added vitamin D. Check the label.  Fatty fish like salmon or trout.  Eggs.  Oysters.  Go outside for 10 to 15 minutes when the sun is shining. Do this 3 times a week. Do not do this if you have skin cancer.  Do not use tanning beds.  Stay at a healthy weight. Lose weight if needed.  Keep all doctor visits as told. GET HELP IF:  You have questions.  You continue to have problems.  You feel sick to your stomach (nauseous) or throw up (vomit).  You cannot go poop (constipated).  You feel confused.  You have severe belly (abdominal) or back pain. MAKE SURE YOU:  Understand these instructions.  Will watch your condition.  Will get help right away if you are not doing well or get worse. Document Released: 07/22/2011 Document Revised: 10/25/2011 Document Reviewed: 07/22/2011 [facility] Patient Information 2013 [place], [place].  Hypothyroidism Hypothyroidism is a condition due to under activity of the thyroid gland. CAUSES  Hypothyroidism may be due to thyroid surgery or disease.  SYMPTOMS  Symptoms are often vague. They may include:  Fatigue.  Mental dullness.  Weakness.  Hoarseness.  Constipation.  Swelling in the face, hands or feet.  Dry skin.  Hair loss.  Sensitivity to  cold.  Menstrual problems.  Patients with hypothyroidism are also more likely to have problems with infections. DIAGNOSIS  The diagnosis is confirmed by lab tests for levels of thyroid hormones (TSH, T3, and T4). These tests are also used to monitor treatment. TREATMENT  Treatment includes the gradual replacement of thyroid hormones. It is very important that you take your thyroid medicine as prescribed daily, even after you begin to feel better. You will probably require thyroid medicine for the rest of your life. To reduce constipation, eat a diet high in fruits and vegetables. Try to get some exercise every day. Please see your doctor for follow up care as recommended so your treatment can be adjusted as your condition improves.  SEEK IMMEDIATE MEDICAL CARE IF:  You have chest pain, palpitations, shortness of breath, or any other serious symptoms. Document Released: 09/09/2004 Document Revised: 10/25/2011 Document Reviewed: 08/02/2005 [facility] Patient Information 2013 [place], [place].  Fall Prevention and Home Safety Falls cause injuries and can affect all age groups. It is possible to prevent falls.  HOW TO PREVENT FALLS  Wear shoes with rubber soles that do not have an opening for your toes.  Keep the inside and outside of your house well lit.  Use night lights throughout your home.  Remove clutter from floors.  Clean up floor spills.  Remove throw rugs or fasten them to the floor with carpet tape.  Do not place electrical cords across pathways.  Put grab bars by your tub, shower, and toilet. Do not use  towel bars as grab bars.  Put handrails on both sides of the stairway. Fix loose handrails.  Do not climb on stools or stepladders, if possible.  Do not wax your floors.  Repair uneven or unsafe sidewalks, walkways, or stairs.  Keep items you use a lot within reach.  Be aware of pets.  Keep emergency numbers next to the telephone.  Put smoke detectors in your home  and near bedrooms. Ask your doctor what other things you can do to prevent falls. Document Released: 05/29/2009 Document Revised: 02/01/2012 Document Reviewed: 11/02/2011 [facility] Patient Information 2013 [place], [place].  Hypertension As your heart beats, it forces blood through your arteries. This force is your blood pressure. If the pressure is too high, it is called hypertension (HTN) or high blood pressure. HTN is dangerous because you may have it and not know it. High blood pressure may mean that your heart has to work harder to pump blood. Your arteries may be narrow or stiff. The extra work puts you at risk for heart disease, stroke, and other problems.  Blood pressure consists of two numbers, a higher number over a lower, 110/72, for example. It is stated as "110 over 72." The ideal is below 120 for the top number (systolic) and under 80 for the bottom (diastolic). Write down your blood pressure today. You should pay close attention to your blood pressure if you have certain conditions such as:  Heart failure.  Prior heart attack.  Diabetes  Chronic kidney disease.  Prior stroke.  Multiple risk factors for heart disease. To see if you have HTN, your blood pressure should be measured while you are seated with your arm held at the level of the heart. It should be measured at least twice. A one-time elevated blood pressure reading (especially in the Emergency Department) does not mean that you need treatment. There may be conditions in which the blood pressure is different between your right and left arms. It is important to see your caregiver soon for a recheck. Most people have essential hypertension which means that there is not a specific cause. This type of high blood pressure may be lowered by changing lifestyle factors such as:  Stress.  Smoking.  Lack of exercise.  Excessive weight.  Drug/tobacco/alcohol use.  Eating less salt. Most people do not have symptoms from  high blood pressure until it has caused damage to the body. Effective treatment can often prevent, delay or reduce that damage. TREATMENT  When a cause has been identified, treatment for high blood pressure is directed at the cause. There are a large number of medications to treat HTN. These fall into several categories, and your caregiver will help you select the medicines that are best for you. Medications may have side effects. You should review side effects with your caregiver. If your blood pressure stays high after you have made lifestyle changes or started on medicines,   Your medication(s) may need to be changed.  Other problems may need to be addressed.  Be certain you understand your prescriptions, and know how and when to take your medicine.  Be sure to follow up with your caregiver within the time frame advised (usually within two weeks) to have your blood pressure rechecked and to review your medications.  If you are taking more than one medicine to lower your blood pressure, make sure you know how and at what times they should be taken. Taking two medicines at the same time can result in blood pressure  that is too low. SEEK IMMEDIATE MEDICAL CARE IF:  You develop a severe headache, blurred or changing vision, or confusion.  You have unusual weakness or numbness, or a faint feeling.  You have severe chest or abdominal pain, vomiting, or breathing problems. MAKE SURE YOU:   Understand these instructions.  Will watch your condition.  Will get help right away if you are not doing well or get worse. Document Released: 08/02/2005 Document Revised: 10/25/2011 Document Reviewed: 03/22/2008 [facility] Patient Information 2013 [place], [place].

## 2012-07-03 NOTE — Progress Notes (Addendum)
Subjective:    Patient ID: Madison Jefferson, female    DOB: 1951/08/11, 61 y.o.   MRN: [id number]  HPI Comments: 61 y.o woman PMH HTN (BP 151/71), unspecified asthma, sinus problems, allergic rhinitis, carpel tunnel syndrome s/p surgery, GERD, hypothyroidism, poor dentition, vitamin D deficiency, and history of chest pain.  She presents to establish care as she used to go to health serv but she did not bring any records today.   She reports a fall last Saturday when trying to prevent her grandson from falling down the escalator at the mall.  She fell on her right knee and back and her right knee was swollen.  Currently pain on right knee and back is 5-6/10.  She tries Aspirin for relief.  The knee pain is worse when the patient is asleep and movement makes her pain better in her right knee.  She is unsure what makes her back pain worse or better but she thinks she needs a new mattress.  She denies h/o recurrent falls except 12/2011  She has a history of chest pain and has seen Dr. Algie Coffer 01/2012 with left heart cath with normal arteries and EF 70%.    She has a history of hypothyroidism and has been without her levothyroxine since Sunday prior to visit.    SH: 3 kids, divorced, lives with grand-daughter     Review of Systems  HENT: Positive for congestion and dental problem.        Denies sore throat or dysphagia   Eyes: Negative for visual disturbance.  Respiratory: Negative for shortness of breath.   Cardiovascular: Negative for chest pain and leg swelling.  Gastrointestinal: Positive for constipation. Negative for abdominal pain, diarrhea and blood in stool.  Genitourinary: Negative for dysuria and hematuria.  Neurological:       History of recent falls        Objective:   Physical Exam  Nursing note and vitals reviewed. Constitutional: She is oriented to person, place, and time. She appears well-developed and well-nourished. She is cooperative. No distress.       pleasant  HENT:    Head: Normocephalic and atraumatic.  Nose: Right sinus exhibits maxillary sinus tenderness. Left sinus exhibits maxillary sinus tenderness.  Mouth/Throat: Oropharynx is clear and moist and mucous membranes are normal. Abnormal dentition. No oropharyngeal exudate.       Mild ttp maxillary and ethmoid regions  Eyes: Conjunctivae normal are normal. Pupils are equal, round, and reactive to light. Right eye exhibits no discharge. Left eye exhibits no discharge. No scleral icterus.  Cardiovascular: Normal rate, regular rhythm, S1 normal, S2 normal and normal heart sounds.  Exam reveals no gallop and no friction rub.   No murmur heard. Pulmonary/Chest: Effort normal and breath sounds normal. No respiratory distress. She has no wheezes.  Abdominal: Soft. Bowel sounds are normal. She exhibits no distension. There is no tenderness.       Obese ab  Musculoskeletal:       Right knee: She exhibits normal range of motion, no swelling and no deformity. tenderness found. Medial joint line tenderness noted. No lateral joint line and no patellar tendon tenderness noted.       Arms: Neurological: She is alert and oriented to person, place, and time. Coordination and gait normal.  Skin: Skin is warm, dry and intact. No rash noted. She is not diaphoretic.  Psychiatric: She has a normal mood and affect. Her speech is normal and behavior is normal. Judgment and thought content  normal. Cognition and memory are normal.          Assessment & Plan:  Follow up  1 month HTN  INTERNAL MEDICINE TEACHING ATTENDING ADDENDUM - Lars Mage, MD: I personally saw and evaluated Ms Dell in this clinic visit in conjunction with the resident, Dr. Shirlee Latch. I have discussed the patient's plan of care with Dr. Shirlee Latch during this visit. I have confirmed the physical exam findings and have read and agree with the clinic note including the plan.

## 2012-07-03 NOTE — Assessment & Plan Note (Signed)
Pt will need referral for colonoscopy and mammogram when orange card approved Flu vx given today

## 2012-07-03 NOTE — Assessment & Plan Note (Signed)
Will repeat T4 and TSH Pt out of Levothyroxine 100 mcg since yesterday Rx refill

## 2012-07-04 ENCOUNTER — Ambulatory Visit: Payer: Self-pay | Admitting: Internal Medicine

## 2012-07-04 LAB — TSH: TSH: 0.794 u[IU]/mL (ref 0.350–4.500)

## 2012-07-04 LAB — BASIC METABOLIC PANEL
CO2: 28 mEq/L (ref 19–32)
Calcium: 9.6 mg/dL (ref 8.4–10.5)
Creat: 0.87 mg/dL (ref 0.50–1.10)
Sodium: 142 mEq/L (ref 135–145)

## 2012-07-04 LAB — T4, FREE: Free T4: 1.13 ng/dL (ref 0.80–1.80)

## 2012-07-05 ENCOUNTER — Telehealth: Payer: Self-pay | Admitting: Licensed Clinical Social Worker

## 2012-07-05 NOTE — Telephone Encounter (Signed)
Madison Jefferson was referred to CSW for prescription assistance.  Madison Jefferson is currently uninsured and has recently been approved for the [facility], but has yet to receive the card.  Pt's EMR states pt was approved for [facility] card as of 06/2012, CSW informed pt.  CSW provided Madison Jefferson with the contact information to [facility] and their walk-in hours.  CSW discussed generally Orange card and [facility] are the best options but can also proceed with application to [facility] and/or [facility] as well.  Pt will contact CSW if there is a need to proceed with the later two programs. Pt is aware of CSW contact information and denies add'l needs at this time.

## 2012-08-02 ENCOUNTER — Other Ambulatory Visit: Payer: Self-pay | Admitting: *Deleted

## 2012-08-02 DIAGNOSIS — I10 Essential (primary) hypertension: Secondary | ICD-10-CM

## 2012-08-02 MED ORDER — HYDROCHLOROTHIAZIDE 25 MG PO TABS: 25.0000 mg | ORAL_TABLET | Freq: Every day | ORAL | Status: DC

## 2012-08-03 NOTE — Telephone Encounter (Signed)
Rx called in to pharmacy. 

## 2012-09-28 ENCOUNTER — Encounter: Payer: No Typology Code available for payment source | Admitting: Internal Medicine

## 2012-10-12 ENCOUNTER — Encounter: Payer: Self-pay | Admitting: Internal Medicine

## 2012-10-12 ENCOUNTER — Ambulatory Visit (INDEPENDENT_AMBULATORY_CARE_PROVIDER_SITE_OTHER): Payer: No Typology Code available for payment source | Admitting: Internal Medicine

## 2012-10-12 ENCOUNTER — Other Ambulatory Visit: Payer: Self-pay | Admitting: Internal Medicine

## 2012-10-12 VITALS — BP 168/74 | HR 79 | Temp 97.7°F | Ht 61.0 in | Wt 191.6 lb

## 2012-10-12 DIAGNOSIS — E038 Other specified hypothyroidism: Secondary | ICD-10-CM

## 2012-10-12 DIAGNOSIS — E890 Postprocedural hypothyroidism: Secondary | ICD-10-CM

## 2012-10-12 DIAGNOSIS — I10 Essential (primary) hypertension: Secondary | ICD-10-CM

## 2012-10-12 DIAGNOSIS — E559 Vitamin D deficiency, unspecified: Secondary | ICD-10-CM

## 2012-10-12 DIAGNOSIS — J309 Allergic rhinitis, unspecified: Secondary | ICD-10-CM

## 2012-10-12 DIAGNOSIS — Z1231 Encounter for screening mammogram for malignant neoplasm of breast: Secondary | ICD-10-CM

## 2012-10-12 DIAGNOSIS — Z0000 Encounter for general adult medical examination without abnormal findings: Secondary | ICD-10-CM

## 2012-10-12 DIAGNOSIS — E669 Obesity, unspecified: Secondary | ICD-10-CM | POA: Insufficient documentation

## 2012-10-12 DIAGNOSIS — R079 Chest pain, unspecified: Secondary | ICD-10-CM

## 2012-10-12 LAB — COMPREHENSIVE METABOLIC PANEL
ALT: 22 U/L (ref 0–35)
AST: 15 U/L (ref 0–37)
Albumin: 4.3 g/dL (ref 3.5–5.2)
Alkaline Phosphatase: 41 U/L (ref 39–117)
BUN: 13 mg/dL (ref 6–23)
Calcium: 9.3 mg/dL (ref 8.4–10.5)
Chloride: 103 mEq/L (ref 96–112)
Potassium: 3.5 mEq/L (ref 3.5–5.3)
Sodium: 141 mEq/L (ref 135–145)
Total Protein: 6.7 g/dL (ref 6.0–8.3)

## 2012-10-12 LAB — CBC WITH DIFFERENTIAL/PLATELET
Basophils Absolute: 0 10*3/uL (ref 0.0–0.1)
Eosinophils Relative: 1 % (ref 0–5)
HCT: 36.8 % (ref 36.0–46.0)
Hemoglobin: 12.6 g/dL (ref 12.0–15.0)
Lymphocytes Relative: 32 % (ref 12–46)
Lymphs Abs: 2.1 10*3/uL (ref 0.7–4.0)
MCV: 79 fL (ref 78.0–100.0)
Monocytes Absolute: 0.5 10*3/uL (ref 0.1–1.0)
Neutro Abs: 3.9 10*3/uL (ref 1.7–7.7)
RBC: 4.66 MIL/uL (ref 3.87–5.11)
RDW: 14.7 % (ref 11.5–15.5)
WBC: 6.6 10*3/uL (ref 4.0–10.5)

## 2012-10-12 LAB — LIPID PANEL: LDL Cholesterol: 83 mg/dL (ref 0–99)

## 2012-10-12 MED ORDER — LOSARTAN POTASSIUM 100 MG PO TABS: 100.0000 mg | ORAL_TABLET | Freq: Every day | ORAL | Status: DC

## 2012-10-12 NOTE — Assessment & Plan Note (Signed)
Will check vitamin d level today.
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
## 2012-10-12 NOTE — Assessment & Plan Note (Addendum)
Elevated today. Patient not taking Cozaar 100 mg qd and does not have bottle with other medication.  Will resume BP medications she has been on since being seen at [facility] and tolerating.  Will Continue HCTZ 25 mg qd, Cozaar 100 mg qd, Metoprolol XL 100 mg qd  If BP not controlled consider d/c BB and adding CCB in the future if needed  CMET

## 2012-10-12 NOTE — Assessment & Plan Note (Addendum)
Will refer for colonoscopy and mammogram today  No need for pap smear due to partial hysterectomy.  Will need to get pathology records of previous hysterectomy to see which female organs were removed.  Cbc, lipid panel

## 2012-10-12 NOTE — Assessment & Plan Note (Signed)
Continue synthroid.

## 2012-10-12 NOTE — Assessment & Plan Note (Signed)
Discussed goal weight is 130-140 lbs  Encourages wt loss via diet and exercise

## 2012-10-12 NOTE — Assessment & Plan Note (Signed)
No active chest pain today Will d/c NTG sublingual for medication list

## 2012-10-12 NOTE — Patient Instructions (Addendum)
General Instructions: Please pick up your blood pressure medication from your pharmacy  No need to take Nitroglycerin Please follow up with mammogram and colonoscopy Please go to the lab for lab work Goal weight is 130-140 pounds  Follow up 6 months, sooner if needed     Treatment Goals:  Goals (1 Years of Data) as of 10/12/12         As of Today As of Today As of Today 07/03/12 07/03/12     Blood Pressure    . Blood Pressure < 140/90  168/74 168/71 155/75 192/80 151/71     Weight    . Weight < 150 lb (68.04 kg)  191 lb 9.6 oz (86.909 kg)   197 lb 12.8 oz (89.721 kg)       Progress Toward Treatment Goals:  Treatment Goal 10/12/2012  Blood pressure unable to assess    Self Care Goals & Plans:  Self Care Goal 10/12/2012  Manage my medications take my medicines as prescribed; bring my medications to every visit; refill my medications on time  Monitor my health keep track of my blood pressure  Eat healthy foods drink diet soda or water instead of juice or soda; eat more vegetables; eat foods that are low in salt; eat baked foods instead of fried foods; eat fruit for snacks and desserts; eat smaller portions  Be physically active find an activity I enjoy; park at the far end of the parking lot       Care Management & Community Referrals:  Referral 10/12/2012  Referrals made for care management support none needed  Referrals made to community resources weight management      Exercise to Lose Weight Exercise and a healthy diet may help you lose weight. Your doctor may suggest specific exercises. EXERCISE IDEAS AND TIPS  Choose low-cost things you enjoy doing, such as walking, bicycling, or exercising to workout videos.  Take stairs instead of the elevator.  Walk during your lunch break.  Park your car further away from work or school.  Go to a gym or an exercise class.  Start with 5 to 10 minutes of exercise each day. Build up to 30 minutes of exercise 4 to 6 days  a week.  Wear shoes with good support and comfortable clothes.  Stretch before and after working out.  Work out until you breathe harder and your heart beats faster.  Drink extra water when you exercise.  Do not do so much that you hurt yourself, feel dizzy, or get very short of breath. Exercises that burn about 150 calories:  Running 1  miles in 15 minutes.  Playing volleyball for 45 to 60 minutes.  Washing and waxing a car for 45 to 60 minutes.  Playing touch football for 45 minutes.  Walking 1  miles in 35 minutes.  Pushing a stroller 1  miles in 30 minutes.  Playing basketball for 30 minutes.  Raking leaves for 30 minutes.  Bicycling 5 miles in 30 minutes.  Walking 2 miles in 30 minutes.  Dancing for 30 minutes.  Shoveling snow for 15 minutes.  Swimming laps for 20 minutes.  Walking up stairs for 15 minutes.  Bicycling 4 miles in 15 minutes.  Gardening for 30 to 45 minutes.  Jumping rope for 15 minutes.  Washing windows or floors for 45 to 60 minutes. Document Released: 09/04/2010 Document Revised: 10/25/2011 Document Reviewed: 09/04/2010 [facility] Patient Information 2013 [place], [place].  Obesity Obesity is defined as having too much total  body fat and a body mass index (BMI) of 30 or more. BMI is an estimate of body fat and is calculated from your height and weight. Obesity happens when you consume more calories than you can burn by exercising or performing daily physical tasks. Prolonged obesity can cause major illnesses or emergencies, such as:   A stroke.  Heart disease.  Diabetes.  Cancer.  Arthritis.  High blood pressure (hypertension).  High cholesterol.  Sleep apnea.  Erectile dysfunction.  Infertility problems. CAUSES   Regularly eating unhealthy foods.  Physical inactivity.  Certain disorders, such as an underactive thyroid (hypothyroidism), Cushing's syndrome, and polycystic ovarian syndrome.  Certain medicines, such  as steroids, some depression medicines, and antipsychotics.  Genetics.  Lack of sleep. DIAGNOSIS  A caregiver can diagnose obesity after calculating your BMI. Obesity will be diagnosed if your BMI is 30 or higher.  There are other methods of measuring obesity levels. Some other methods include measuring your skin fold thickness, your waist circumference, and comparing your hip circumference to your waist circumference. TREATMENT  A healthy treatment program includes some or all of the following:  Long-term dietary changes.  Exercise and physical activity.  Behavioral and lifestyle changes.  Medicine only under the supervision of your caregiver. Medicines may help, but only if they are used with diet and exercise programs. An unhealthy treatment program includes:  Fasting.  Fad diets.  Supplements and drugs. These choices do not succeed in long-term weight control.  HOME CARE INSTRUCTIONS   Exercise and perform physical activity as directed by your caregiver. To increase physical activity, try the following:  Use stairs instead of elevators.  Park farther away from store entrances.  Garden, bike, or walk instead of watching television or using the computer.  Eat healthy, low-calorie foods and drinks on a regular basis. Eat more fruits and vegetables. Use low-calorie cookbooks or take healthy cooking classes.  Limit fast food, sweets, and processed snack foods.  Eat smaller portions.  Keep a daily journal of everything you eat. There are many free websites to help you with this. It may be helpful to measure your foods so you can determine if you are eating the correct portion sizes.  Avoid drinking alcohol. Drink more water and drinks without calories.  Take vitamins and supplements only as recommended by your caregiver.  Weight-loss support groups, Optometrist, counselors, and stress reduction education can also be very helpful. SEEK IMMEDIATE MEDICAL CARE  IF:  You have chest pain or tightness.  You have trouble breathing or feel short of breath.  You have weakness or leg numbness.  You feel confused or have trouble talking.  You have sudden changes in your vision. MAKE SURE YOU:  Understand these instructions.  Will watch your condition.  Will get help right away if you are not doing well or get worse. Document Released: 09/09/2004 Document Revised: 02/01/2012 Document Reviewed: 09/08/2011 [facility] Patient Information 2013 [place], [place].  Hypertension Hypertension is another name for high blood pressure. High blood pressure may mean that your heart needs to work harder to pump blood. Blood pressure consists of two numbers, which includes a higher number over a lower number (example: 110/72). HOME CARE   Make lifestyle changes as told by your doctor. This may include weight loss and exercise.  Take your blood pressure medicine every day.  Limit how much salt you use.  Stop smoking if you smoke.  Do not use drugs.  Talk to your doctor if you are using  decongestants or birth control pills. These medicines might make blood pressure higher.  Females should not drink more than 1 alcoholic drink per day. Males should not drink more than 2 alcoholic drinks per day.  See your doctor as told. GET HELP RIGHT AWAY IF:   You have a blood pressure reading with a top number of 180 or higher.  You get a very bad headache.  You get blurred or changing vision.  You feel confused.  You feel weak, numb, or faint.  You get chest or belly (abdominal) pain.  You throw up (vomit).  You cannot breathe very well. MAKE SURE YOU:   Understand these instructions.  Will watch your condition.  Will get help right away if you are not doing well or get worse. Document Released: 01/19/2008 Document Revised: 10/25/2011 Document Reviewed: 01/19/2008 [facility] Patient Information 2013 [place], [place].

## 2012-10-13 NOTE — Progress Notes (Signed)
agree with notes and plans.

## 2012-10-14 ENCOUNTER — Encounter: Payer: Self-pay | Admitting: Internal Medicine

## 2012-10-17 ENCOUNTER — Ambulatory Visit (HOSPITAL_COMMUNITY): Payer: No Typology Code available for payment source

## 2012-10-19 ENCOUNTER — Encounter: Payer: Self-pay | Admitting: Gastroenterology

## 2012-10-26 ENCOUNTER — Ambulatory Visit (HOSPITAL_COMMUNITY)
Admission: RE | Admit: 2012-10-26 | Discharge: 2012-10-26 | Disposition: A | Discharge status: Home / Self Care | Payer: No Typology Code available for payment source | Source: Ambulatory Visit | Attending: Internal Medicine | Admitting: Internal Medicine

## 2012-10-26 DIAGNOSIS — Z1231 Encounter for screening mammogram for malignant neoplasm of breast: Secondary | ICD-10-CM

## 2012-11-01 ENCOUNTER — Encounter: Payer: Self-pay | Admitting: Internal Medicine

## 2012-11-01 ENCOUNTER — Other Ambulatory Visit: Payer: Self-pay | Admitting: *Deleted

## 2012-11-01 DIAGNOSIS — E038 Other specified hypothyroidism: Secondary | ICD-10-CM

## 2012-11-01 DIAGNOSIS — J45909 Unspecified asthma, uncomplicated: Secondary | ICD-10-CM

## 2012-11-01 MED ORDER — LEVOTHYROXINE SODIUM 100 MCG PO TABS: 100.0000 ug | ORAL_TABLET | Freq: Every day | ORAL | Status: DC

## 2012-11-01 NOTE — Telephone Encounter (Signed)
Pharmacy is changing brand for levothyroxine from mylan brand to sandoz brand.  Will this be okay with you?  Must get you approval for this change.

## 2012-11-01 NOTE — Telephone Encounter (Signed)
Rx called in  Okay to change brand per Dr Shirlee Latch

## 2012-11-02 ENCOUNTER — Ambulatory Visit (AMBULATORY_SURGERY_CENTER): Payer: No Typology Code available for payment source | Admitting: *Deleted

## 2012-11-02 VITALS — Ht 61.0 in | Wt 189.8 lb

## 2012-11-02 DIAGNOSIS — Z1211 Encounter for screening for malignant neoplasm of colon: Secondary | ICD-10-CM

## 2012-11-02 MED ORDER — ALBUTEROL SULFATE HFA 108 (90 BASE) MCG/ACT IN AERS: 2.0000 | INHALATION_SPRAY | Freq: Four times a day (QID) | RESPIRATORY_TRACT | Status: DC | PRN

## 2012-11-02 NOTE — Progress Notes (Signed)
No egg or soy allergies.  She had a colonoscopy with Dr. Elpidio Anis at [facility] 10 years ago.  Release of info form filled out and given to R. Stallings CMA

## 2012-11-02 NOTE — Telephone Encounter (Signed)
Called to pharm 

## 2012-11-14 ENCOUNTER — Telehealth: Payer: Self-pay | Admitting: Gastroenterology

## 2012-11-14 NOTE — Telephone Encounter (Signed)
no

## 2012-11-16 ENCOUNTER — Encounter: Payer: No Typology Code available for payment source | Admitting: Gastroenterology

## 2012-11-17 ENCOUNTER — Ambulatory Visit (INDEPENDENT_AMBULATORY_CARE_PROVIDER_SITE_OTHER): Payer: No Typology Code available for payment source | Admitting: Internal Medicine

## 2012-11-17 ENCOUNTER — Encounter: Payer: Self-pay | Admitting: Internal Medicine

## 2012-11-17 VITALS — BP 160/72 | HR 56 | Temp 98.1°F | Wt 186.9 lb

## 2012-11-17 DIAGNOSIS — J309 Allergic rhinitis, unspecified: Secondary | ICD-10-CM

## 2012-11-17 DIAGNOSIS — I10 Essential (primary) hypertension: Secondary | ICD-10-CM

## 2012-11-17 MED ORDER — AMLODIPINE BESYLATE 5 MG PO TABS: 5.0000 mg | ORAL_TABLET | Freq: Every day | ORAL | Status: DC

## 2012-11-17 MED ORDER — LORATADINE 10 MG PO TABS: 10.0000 mg | ORAL_TABLET | Freq: Every day | ORAL | Status: DC

## 2012-11-17 MED ORDER — SALINE NASAL SPRAY 0.65 % NA SOLN: 2.0000 | NASAL | Status: DC | PRN

## 2012-11-17 NOTE — Patient Instructions (Signed)
Take the Claritin daily.  Begin using Ocean saline nasal spray or any other brand of saline nasal spray. Spray one to 2 sprays in each nostril, then below your nose really well. Afterwards then use the Nasonex one spray in each nostril up to twice a day. The saline nasal spray can be used as many times a day as needed. This will help clean out your nose and help lubricate you nasal passages.  I do to your elevated blood pressure, I'm adding a new medication called amlodipine or Norvasc. This will be a once a day medication.   I would like for you to followup in clinic in 3 months, but please call if your allergy symptoms do not improve over the next week.    Allergic Rhinitis Allergic rhinitis is when the mucous membranes in the nose respond to allergens. Allergens are particles in the air that cause your body to have an allergic reaction. This causes you to release allergic antibodies. Through a chain of events, these eventually cause you to release histamine into the blood stream (hence the use of antihistamines). Although meant to be protective to the body, it is this release that causes your discomfort, such as frequent sneezing, congestion and an itchy runny nose.  CAUSES  The pollen allergens may come from grasses, trees, and weeds. This is seasonal allergic rhinitis, or "hay fever." Other allergens cause year-round allergic rhinitis (perennial allergic rhinitis) such as house dust mite allergen, pet dander and mold spores.  SYMPTOMS   Nasal stuffiness (congestion).  Runny, itchy nose with sneezing and tearing of the eyes.  There is often an itching of the mouth, eyes and ears. It cannot be cured, but it can be controlled with medications. DIAGNOSIS  If you are unable to determine the offending allergen, skin or blood testing may find it. TREATMENT   Avoid the allergen.  Medications and allergy shots (immunotherapy) can help.  Hay fever may often be treated with antihistamines in  pill or nasal spray forms. Antihistamines block the effects of histamine. There are over-the-counter medicines that may help with nasal congestion and swelling around the eyes. Check with your caregiver before taking or giving this medicine. If the treatment above does not work, there are many new medications your caregiver can prescribe. Stronger medications may be used if initial measures are ineffective. Desensitizing injections can be used if medications and avoidance fails. Desensitization is when a patient is given ongoing shots until the body becomes less sensitive to the allergen. Make sure you follow up with your caregiver if problems continue. SEEK MEDICAL CARE IF:   You develop fever (more than 100.5 F (38.1 C).  You develop a cough that does not stop easily (persistent).  You have shortness of breath.  You start wheezing.  Symptoms interfere with normal daily activities. Document Released: 04/27/2001 Document Revised: 10/25/2011 Document Reviewed: 11/06/2008 [facility] Patient Information 2013 [place], [place].

## 2012-11-17 NOTE — Progress Notes (Signed)
Patient ID: Madison Jefferson, female   DOB: [birth year], 62 y.o.   MRN: [id number]  Subjective:   [name]   DOB: [birth year] 62 y.o.   MRN: [id number]  HPI: [name] is a 62 y.o. F with PMH hypothyroidism s/p ablation, HTN, asthma, and allergic rhinitis who presents to the ED with complaints of sinusitis.  Since Sunday she has been experiencing post nasal drainage, nasal congestion, scratchy throat, watery and itchy eyes, pressure in her ears and in the bridge of her nose, and a productive cough with green mucus that has turned clear over teh past few days. She denies any fevers or chills. She has been using Nasonex at night without relief. She was previously on Claritin but ran out of her medicine 2 months ago.   She endorses compliance with her medications.  Past Medical History  Diagnosis Date  . Hypertension   . Allergy   . Asthma   . GERD (gastroesophageal reflux disease)   . Vitamin D deficiency   . Fatty liver   . Thyroid disease     hypothyroid  . Urinary incontinence     mixed urge and stress   . Lumbar radiculopathy   . Atrial fibrillation     h/o of isolated AF associated with hypothyroidism   . Diverticulosis     cecum  . Arthritis   . TIA (transient ischemic attack)     20 06   Current Outpatient Prescriptions  Medication Sig Dispense Refill  . albuterol (PROVENTIL HFA;VENTOLIN HFA) 108 (90 BASE) MCG/ACT inhaler Inhale 2 puffs into the lungs every 6 (six) hours as needed.  1 Inhaler  2  . aspirin 325 MG tablet Take 1 tablet (325 mg total) by mouth daily.  30 tablet  11  . hydrochlorothiazide (HYDRODIURIL) 25 MG tablet Take 1 tablet (25 mg total) by mouth daily.  30 tablet  5  . levothyroxine (SYNTHROID, LEVOTHROID) 100 MCG tablet Take 1 tablet (100 mcg total) by mouth daily.  30 tablet  5  . loratadine (CLARITIN) 10 MG tablet Take 1 tablet (10 mg total) by mouth daily.  30 tablet  5  . losartan (COZAAR) 100 MG tablet Take 1 tablet (100 mg  total) by mouth daily.  30 tablet  5  . metoprolol succinate (TOPROL-XL) 100 MG 24 hr tablet Take 1 tablet (100 mg total) by mouth daily. Take with or immediately following a meal.  30 tablet  5  . Vitamin D, Ergocalciferol, (DRISDOL) 50000 UNITS CAPS Take 1 capsule (50,000 Units total) by mouth every 7 (seven) days. Take on tuesday  4 capsule  5   No current facility-administered medications for this visit.   Family History  Problem Relation Age of Onset  . Diabetes Mother   . Stroke Mother   . Cancer Mother     mother <50 at diagnosis (breast); h/o oral cancer  . Diabetes Father   . Heart disease Father   . Stroke Father   . Hypertension Father   . Diabetes Sister   . Diabetes Brother   . Colon cancer Brother   . Diabetes Paternal Uncle   . Cancer Brother     lung  . Cancer Brother     throat  . Esophageal cancer Neg Hx   . Rectal cancer Neg Hx   . Stomach cancer Neg Hx    History   Social History  . Marital Status: Divorced    Spouse Name: N/A  Number of Children: N/A  . Years of Education: N/A   Social History Main Topics  . Smoking status: Never Smoker   . Smokeless tobacco: Never Used  . Alcohol Use: No  . Drug Use: No  . Sexually Active: Not on file   Other Topics Concern  . Not on file   Social History Narrative  . No narrative on file   Review of Systems: A 10 point ROS was performed; pertinent positives and negatives were noted in the HPI   Objective:  Physical Exam: There were no vitals filed for this visit. Constitutional: Vital signs reviewed.  Patient is a well-developed and well-nourished female in no acute distress and cooperative with exam.  Head: Normocephalic and atraumatic Eyes: PERRL, EOMI, conjunctivae normal, No scleral icterus.  Neck: Supple, Trachea midline  Cardiovascular: RRR, S1 normal, S2 normal, no MRG, pulses symmetric and intact bilaterally Pulmonary/Chest: CTAB, no wheezes, rales, or rhonchi Abdominal: Soft. Non-tender,  non-distended Musculoskeletal: No joint deformities, erythema, or stiffness, ROM full, no edema Neurological: A&O x3, nonfocal Skin: Warm, dry and intact. No rash, cyanosis, or clubbing.  Psychiatric: Normal mood and affect.   Assessment & Plan:   Please refer to Problem List based Assessment and Plan

## 2012-11-17 NOTE — Assessment & Plan Note (Addendum)
BP Readings from Last 3 Encounters:  11/17/12 160/72  10/12/12 168/74  07/03/12 192/80    Lab Results  Component Value Date   NA 141 10/12/2012   K 3.5 10/12/2012   CREATININE 1.03 10/12/2012    Assessment: Blood pressure control:  not at goal Progress toward BP goal:   unchanged SLOV  Plan: Medications:  continue current medications, and adding CCB   Her blood pressure is still elevated. Will add CCB and recheck BP in 3 months - Amlodipine 5mg  po daily

## 2012-11-17 NOTE — Assessment & Plan Note (Signed)
Significant seasonal allergy symptoms present since Sunday. Pt out of Claritin x 50mo, and nasal steroid is not preventing her symptoms.  - Restarting Claritin 10mg  daily - Ocean nasal saline spray, 2 sp each nostril BID and PRN prior to using the Nasonx - Continuing Nasonex, using 1sp ea nostril BID - Pt to call the clinic in the next week or 2 if her symptoms have not improved or have worsened.

## 2012-11-20 NOTE — Progress Notes (Signed)
Case discussed with Dr. Glenn at time of visit.  We reviewed the resident's history and exam and pertinent patient test results.  I agree with the assessment, diagnosis, and plan of care documented in the resident's note. 

## 2013-01-01 ENCOUNTER — Ambulatory Visit: Payer: Self-pay

## 2013-01-16 NOTE — Addendum Note (Signed)
Addended by: Neomia Dear on: 01/16/2013 06:46 PM   Modules accepted: Orders

## 2013-03-01 ENCOUNTER — Encounter: Payer: Self-pay | Admitting: Internal Medicine

## 2013-03-01 ENCOUNTER — Ambulatory Visit (INDEPENDENT_AMBULATORY_CARE_PROVIDER_SITE_OTHER): Payer: No Typology Code available for payment source | Admitting: Internal Medicine

## 2013-03-01 VITALS — BP 142/67 | HR 55 | Temp 97.4°F | Ht 62.0 in | Wt 185.2 lb

## 2013-03-01 DIAGNOSIS — K5900 Constipation, unspecified: Secondary | ICD-10-CM

## 2013-03-01 DIAGNOSIS — Z0000 Encounter for general adult medical examination without abnormal findings: Secondary | ICD-10-CM

## 2013-03-01 DIAGNOSIS — K5730 Diverticulosis of large intestine without perforation or abscess without bleeding: Secondary | ICD-10-CM

## 2013-03-01 DIAGNOSIS — E559 Vitamin D deficiency, unspecified: Secondary | ICD-10-CM

## 2013-03-01 DIAGNOSIS — I10 Essential (primary) hypertension: Secondary | ICD-10-CM

## 2013-03-01 DIAGNOSIS — E038 Other specified hypothyroidism: Secondary | ICD-10-CM

## 2013-03-01 DIAGNOSIS — J309 Allergic rhinitis, unspecified: Secondary | ICD-10-CM

## 2013-03-01 MED ORDER — LORATADINE 10 MG PO TABS: 10.0000 mg | ORAL_TABLET | Freq: Every day | ORAL | Status: DC

## 2013-03-01 MED ORDER — VITAMIN D (ERGOCALCIFEROL) 1.25 MG (50000 UNIT) PO CAPS: 50000.0000 [IU] | ORAL_CAPSULE | ORAL | Status: DC

## 2013-03-01 NOTE — Patient Instructions (Addendum)
General Instructions: You are on the waiting list to get an eye appointment Please keep your colonoscopy appointment I will send you a letter with lab results  Follow up in 4-6 months regular appointment.  Log your blood pressure    Treatment Goals:  Goals (1 Years of Data) as of 03/01/13         03/01/13 11/17/12 11/02/12 10/12/12 10/12/12     Blood Pressure    . Blood Pressure < 140/90  142/67 160/72  168/74 168/71     Weight    . Weight < 150 lb (68.04 kg)  185 lb 3.2 oz (84.006 kg) 186 lb 14.4 oz (84.777 kg) 189 lb 12.8 oz (86.093 kg) 191 lb 9.6 oz (86.909 kg)       Progress Toward Treatment Goals:  Treatment Goal 03/01/2013  Blood pressure improved    Self Care Goals & Plans:  Self Care Goal 03/01/2013  Manage my medications take my medicines as prescribed; bring my medications to every visit; refill my medications on time  Monitor my health keep track of my blood pressure  Eat healthy foods drink diet soda or water instead of juice or soda; eat more vegetables; eat foods that are low in salt; eat baked foods instead of fried foods; eat fruit for snacks and desserts; eat smaller portions  Be physically active -  Meeting treatment goals maintain the current self-care plan       Care Management & Community Referrals:  Referral 03/01/2013  Referrals made for care management support none needed  Referrals made to community resources none       Hypothyroidism The thyroid is a large gland located in the lower front of your neck. The thyroid gland helps control metabolism. Metabolism is how your body handles food. It controls metabolism with the hormone thyroxine. When this gland is underactive (hypothyroid), it produces too little hormone.  CAUSES These include:   Absence or destruction of thyroid tissue.  Goiter due to iodine deficiency.  Goiter due to medications.  Congenital defects (since birth).  Problems with the pituitary. This causes a lack of TSH (thyroid  stimulating hormone). This hormone tells the thyroid to turn out more hormone. SYMPTOMS  Lethargy (feeling as though you have no energy)  Cold intolerance  Weight gain (in spite of normal food intake)  Dry skin  Coarse hair  Menstrual irregularity (if severe, may lead to infertility)  Slowing of thought processes Cardiac problems are also caused by insufficient amounts of thyroid hormone. Hypothyroidism in the newborn is cretinism, and is an extreme form. It is important that this form be treated adequately and immediately or it will lead rapidly to retarded physical and mental development. DIAGNOSIS  To prove hypothyroidism, your caregiver may do blood tests and ultrasound tests. Sometimes the signs are hidden. It may be necessary for your caregiver to watch this illness with blood tests either before or after diagnosis and treatment. TREATMENT  Low levels of thyroid hormone are increased by using synthetic thyroid hormone. This is a safe, effective treatment. It usually takes about four weeks to gain the full effects of the medication. After you have the full effect of the medication, it will generally take another four weeks for problems to leave. Your caregiver may start you on low doses. If you have had heart problems the dose may be gradually increased. It is generally not an emergency to get rapidly to normal. HOME CARE INSTRUCTIONS   Take your medications as your caregiver suggests.  Let your caregiver know of any medications you are taking or start taking. Your caregiver will help you with dosage schedules.  As your condition improves, your dosage needs may increase. It will be necessary to have continuing blood tests as suggested by your caregiver.  Report all suspected medication side effects to your caregiver. SEEK MEDICAL CARE IF: Seek medical care if you develop:  Sweating.  Tremulousness (tremors).  Anxiety.  Rapid weight loss.  Heat intolerance.  Emotional  swings.  Diarrhea.  Weakness. SEEK IMMEDIATE MEDICAL CARE IF:  You develop chest pain, an irregular heart beat (palpitations), or a rapid heart beat. MAKE SURE YOU:   Understand these instructions.  Will watch your condition.  Will get help right away if you are not doing well or get worse. Document Released: 08/02/2005 Document Revised: 10/25/2011 Document Reviewed: 03/22/2008 [facility] Patient Information 2014 [place], [place].  Vitamin D Deficiency Vitamin D is an important vitamin that your body needs. Having too little of it in your body is called a deficiency. A very bad deficiency can make your bones soft and can cause a condition called rickets.  Vitamin D is important to your body for different reasons, such as:   It helps your body absorb 2 minerals called calcium and phosphorus.  It helps make your bones healthy.  It may prevent some diseases, such as diabetes and multiple sclerosis.  It helps your muscles and heart. You can get vitamin D in several ways. It is a natural part of some foods. The vitamin is also added to some dairy products and cereals. Some people take vitamin D supplements. Also, your body makes vitamin D when you are in the sun. It changes the sun's rays into a form of the vitamin that your body can use. CAUSES   Not eating enough foods that contain vitamin D.  Not getting enough sunlight.  Having certain digestive system diseases that make it hard to absorb vitamin D. These diseases include Crohn's disease, chronic pancreatitis, and cystic fibrosis.  Having a surgery in which part of the stomach or small intestine is removed.  Being obese. Fat cells pull vitamin D out of your blood. That means that obese people may not have enough vitamin D left in their blood and in other body tissues.  Having chronic kidney or liver disease. RISK FACTORS Risk factors are things that make you more likely to develop a vitamin D deficiency. They include:  Being  older.  Not being able to get outside very much.  Living in a nursing home.  Having had broken bones.  Having weak or thin bones (osteoporosis).  Having a disease or condition that changes how your body absorbs vitamin D.  Having dark skin.  Some medicines such as seizure medicines or steroids.  Being overweight or obese. SYMPTOMS Mild cases of vitamin D deficiency may not have any symptoms. If you have a very bad case, symptoms may include:  Bone pain.  Muscle pain.  Falling often.  Broken bones caused by a minor injury, due to osteoporosis. DIAGNOSIS A blood test is the best way to tell if you have a vitamin D deficiency. TREATMENT Vitamin D deficiency can be treated in different ways. Treatment for vitamin D deficiency depends on what is causing it. Options include:  Taking vitamin D supplements.  Taking a calcium supplement. Your caregiver will suggest what dose is best for you. HOME CARE INSTRUCTIONS  Take any supplements that your caregiver prescribes. Follow the directions carefully. Take only  the suggested amount.  Have your blood tested 2 months after you start taking supplements.  Eat foods that contain vitamin D. Healthy choices include:  Fortified dairy products, cereals, or juices. Fortified means vitamin D has been added to the food. Check the label on the package to be sure.  Fatty fish like salmon or trout.  Eggs.  Oysters.  Spend some time in the sun. Most people should get out in the sun without sunblock for 10 to 15 minutes at a time. Do this 3 times a week. People who have had skin cancer should not do this. Ask your caregiver how long you should be in the sun. Do not use a tanning bed.  Keep your weight at a healthy level. Lose weight if you need to.  Keep all follow-up appointments. Your caregiver will need to perform blood tests to make sure your vitamin D deficiency is going away. SEEK MEDICAL CARE IF:  You have any questions about  your treatment.  You continue to have symptoms of vitamin D deficiency.  You have nausea or vomiting.  You are constipated.  You feel confused.  You have severe abdominal or back pain. MAKE SURE YOU:  Understand these instructions.  Will watch your condition.  Will get help right away if you are not doing well or get worse. Document Released: 10/25/2011 Document Reviewed: 10/25/2011 [facility] Patient Information 2014 [place], [place].  Hypertension As your heart beats, it forces blood through your arteries. This force is your blood pressure. If the pressure is too high, it is called hypertension (HTN) or high blood pressure. HTN is dangerous because you may have it and not know it. High blood pressure may mean that your heart has to work harder to pump blood. Your arteries may be narrow or stiff. The extra work puts you at risk for heart disease, stroke, and other problems.  Blood pressure consists of two numbers, a higher number over a lower, 110/72, for example. It is stated as "110 over 72." The ideal is below 120 for the top number (systolic) and under 80 for the bottom (diastolic). Write down your blood pressure today. You should pay close attention to your blood pressure if you have certain conditions such as:  Heart failure.  Prior heart attack.  Diabetes  Chronic kidney disease.  Prior stroke.  Multiple risk factors for heart disease. To see if you have HTN, your blood pressure should be measured while you are seated with your arm held at the level of the heart. It should be measured at least twice. A one-time elevated blood pressure reading (especially in the Emergency Department) does not mean that you need treatment. There may be conditions in which the blood pressure is different between your right and left arms. It is important to see your caregiver soon for a recheck. Most people have essential hypertension which means that there is not a specific cause. This type of  high blood pressure may be lowered by changing lifestyle factors such as:  Stress.  Smoking.  Lack of exercise.  Excessive weight.  Drug/tobacco/alcohol use.  Eating less salt. Most people do not have symptoms from high blood pressure until it has caused damage to the body. Effective treatment can often prevent, delay or reduce that damage. TREATMENT  When a cause has been identified, treatment for high blood pressure is directed at the cause. There are a large number of medications to treat HTN. These fall into several categories, and your caregiver will help  you select the medicines that are best for you. Medications may have side effects. You should review side effects with your caregiver. If your blood pressure stays high after you have made lifestyle changes or started on medicines,   Your medication(s) may need to be changed.  Other problems may need to be addressed.  Be certain you understand your prescriptions, and know how and when to take your medicine.  Be sure to follow up with your caregiver within the time frame advised (usually within two weeks) to have your blood pressure rechecked and to review your medications.  If you are taking more than one medicine to lower your blood pressure, make sure you know how and at what times they should be taken. Taking two medicines at the same time can result in blood pressure that is too low. SEEK IMMEDIATE MEDICAL CARE IF:  You develop a severe headache, blurred or changing vision, or confusion.  You have unusual weakness or numbness, or a faint feeling.  You have severe chest or abdominal pain, vomiting, or breathing problems. MAKE SURE YOU:   Understand these instructions.  Will watch your condition.  Will get help right away if you are not doing well or get worse. Document Released: 08/02/2005 Document Revised: 10/25/2011 Document Reviewed: 03/22/2008 [facility] Patient Information 2014 [place], [place].

## 2013-03-02 ENCOUNTER — Encounter: Payer: Self-pay | Admitting: Internal Medicine

## 2013-03-02 DIAGNOSIS — K5790 Diverticulosis of intestine, part unspecified, without perforation or abscess without bleeding: Secondary | ICD-10-CM | POA: Insufficient documentation

## 2013-03-02 DIAGNOSIS — K5900 Constipation, unspecified: Secondary | ICD-10-CM | POA: Insufficient documentation

## 2013-03-02 LAB — VITAMIN D 25 HYDROXY (VIT D DEFICIENCY, FRACTURES): Vit D, 25-Hydroxy: 29 ng/mL — ABNORMAL LOW (ref 30–89)

## 2013-03-02 NOTE — Progress Notes (Signed)
  Subjective:    Patient ID: Madison Jefferson, female    DOB: 10/14/1950, 62 y.o.   MRN: [id number]  HPI Comments: 62 y.o PMH HTN (BP 142/67 today more controlled after addition of Norvasc 5 mg), hypothyroidism, vitamin D deficiency, carpel tunnel syndrome, allergic rhinitis, asthma, GERD, fatty liver disease, back pain with radiculopathy, diverticulosis, obesity. She presents for follow up.  She needs Rx refills of Claritin and vitamin D and possibly other medications which she cant remember.  She c/o bloating and constipation for the last couple of days. Last bowel movement was on Tuesday.  She canceled her last appt for colonoscopy a few months ago.  She had a colonoscopy years ago at [facility].  She wants an appt to see the eye doctor.      Review of Systems  Respiratory: Negative for shortness of breath.   Cardiovascular: Negative for chest pain and leg swelling.  Gastrointestinal: Positive for constipation. Negative for blood in stool.       Bloating  Genitourinary: Negative for hematuria.       Objective:   Physical Exam  Nursing note and vitals reviewed. Constitutional: She is oriented to person, place, and time. She appears well-developed and well-nourished. She is cooperative. No distress.  HENT:  Head: Normocephalic and atraumatic.  Mouth/Throat: Oropharynx is clear and moist and mucous membranes are normal. No oropharyngeal exudate.  Eyes: Conjunctivae are normal. Pupils are equal, round, and reactive to light. Right eye exhibits no discharge. Left eye exhibits no discharge. No scleral icterus.  Cardiovascular: Normal rate, regular rhythm, S1 normal, S2 normal and normal heart sounds.   No murmur heard. No lower extremity edema   Pulmonary/Chest: Effort normal and breath sounds normal. No respiratory distress. She has no wheezes.  Abdominal: Soft. Bowel sounds are normal. There is no tenderness.  obesity  Neurological: She is alert and oriented to person, place, and time. Gait  normal.  Skin: Skin is warm, dry and intact. No rash noted. She is not diaphoretic.  Psychiatric: She has a normal mood and affect. Her speech is normal and behavior is normal. Judgment and thought content normal. Cognition and memory are normal.          Assessment & Plan:

## 2013-03-02 NOTE — Assessment & Plan Note (Addendum)
Referred for screening colonoscopy.  Reviewed records colonoscopy 2002 at [facility] showed diverticulosis  Referred for eye exam today

## 2013-03-02 NOTE — Assessment & Plan Note (Addendum)
Checked vitamin D level today Rx refill vitamin D 50K units weekly. Will continue 50K units weekly and keep assessing vitamin D level in the future to see if can titrate dose down

## 2013-03-02 NOTE — Assessment & Plan Note (Signed)
Also associated with bloating.  Patients last stool was Tuesday Encouraged her to drink prune juice.  If not resolving can try other agents i.e Miralax  Also no colonoscopy since 2002 referred again today for colonoscopy and encourage she keep the appt this time

## 2013-03-02 NOTE — Assessment & Plan Note (Signed)
BP Readings from Last 3 Encounters:  03/01/13 142/67  11/17/12 160/72  10/12/12 168/74    Lab Results  Component Value Date   NA 141 10/12/2012   K 3.5 10/12/2012   CREATININE 1.03 10/12/2012    Assessment: Blood pressure control: mildly elevated Progress toward BP goal:  improved Comments: more controlled after addition of Norvasc   Plan: Medications:  continue current medications Educational resources provided: brochure Self management tools provided: none  Other plans: Reassess BP at return visit

## 2013-03-02 NOTE — Assessment & Plan Note (Signed)
Continue Synthroid 100 mcg tablet daily

## 2013-03-05 NOTE — Progress Notes (Signed)
Case discussed with Dr. McLean at the time of the visit.  We reviewed the resident's history and exam and pertinent patient test results.  I agree with the assessment, diagnosis, and plan of care documented in the resident's note.     

## 2013-03-08 ENCOUNTER — Encounter: Payer: No Typology Code available for payment source | Admitting: Internal Medicine

## 2013-03-21 ENCOUNTER — Other Ambulatory Visit: Payer: Self-pay | Admitting: *Deleted

## 2013-03-21 DIAGNOSIS — I10 Essential (primary) hypertension: Secondary | ICD-10-CM

## 2013-03-22 MED ORDER — METOPROLOL SUCCINATE ER 100 MG PO TB24: 100.0000 mg | ORAL_TABLET | Freq: Every day | ORAL | Status: DC

## 2013-03-26 ENCOUNTER — Telehealth: Payer: Self-pay | Admitting: Gastroenterology

## 2013-04-24 ENCOUNTER — Encounter: Payer: Self-pay | Admitting: Internal Medicine

## 2013-04-24 ENCOUNTER — Ambulatory Visit (INDEPENDENT_AMBULATORY_CARE_PROVIDER_SITE_OTHER): Payer: No Typology Code available for payment source | Admitting: Internal Medicine

## 2013-04-24 VITALS — BP 166/75 | HR 61 | Temp 97.5°F | Ht 62.0 in | Wt 190.8 lb

## 2013-04-24 DIAGNOSIS — I10 Essential (primary) hypertension: Secondary | ICD-10-CM

## 2013-04-24 DIAGNOSIS — R209 Unspecified disturbances of skin sensation: Secondary | ICD-10-CM

## 2013-04-24 DIAGNOSIS — R200 Anesthesia of skin: Secondary | ICD-10-CM | POA: Insufficient documentation

## 2013-04-24 DIAGNOSIS — J309 Allergic rhinitis, unspecified: Secondary | ICD-10-CM

## 2013-04-24 MED ORDER — ASPIRIN 325 MG PO TABS: 325.0000 mg | ORAL_TABLET | Freq: Every day | ORAL | Status: DC

## 2013-04-24 MED ORDER — MOMETASONE FUROATE 50 MCG/ACT NA SUSP: 1.0000 | Freq: Every day | NASAL | Status: DC

## 2013-04-24 NOTE — Progress Notes (Signed)
Patient ID: Madison Jefferson, female   DOB: [birth year], 62 y.o.   MRN: [id number]   Subjective:   [name]   DOB: 1951/05/13 62 y.o.   MRN: [id number]  HPI: [name] is a 62 y.o. female medical history of hypertension, asthma, allergic rhinitis, GERD, CVA. Presents today with 3 week history of right maxillary sinus tenderness, right facial numbness, blurry vision in her right eye. She reports that these symptoms have not improved over the past 3 weeks, although she has got some relief of the headache using Excedrin. She reports she has had sinusitis in the past which has responded to Nasonex. She reports she has only used Nasonex once 3 days ago for her current symptoms. She reports she has never experienced blurry vision like this, or numbness of the face. She reports she normally takes 325 mg aspirin because of her history of stroke, however she is not take aspirin in over a month. In addition she reports she has not taken her hydrochlorothiazide in 2 days.  Past Medical History  Diagnosis Date  . Hypertension   . Allergy   . Asthma   . GERD (gastroesophageal reflux disease)   . Vitamin D deficiency   . Fatty liver   . Thyroid disease     hypothyroid  . Urinary incontinence     mixed urge and stress   . Lumbar radiculopathy   . Atrial fibrillation     h/o of isolated AF associated with hypothyroidism   . Diverticulosis     cecum  . Arthritis   . TIA (transient ischemic attack)     2006  . Diverticulosis     colonoscopy [name]   Current Outpatient Prescriptions  Medication Sig Dispense Refill  . albuterol (PROVENTIL HFA;VENTOLIN HFA) 108 (90 BASE) MCG/ACT inhaler Inhale 2 puffs into the lungs every 6 (six) hours as needed.  1 Inhaler  2  . amLODipine (NORVASC) 5 MG tablet Take 1 tablet (5 mg total) by mouth daily.  30 tablet  11  . aspirin 325 MG tablet Take 1 tablet (325 mg total) by mouth daily.  30 tablet  11  . hydrochlorothiazide  (HYDRODIURIL) 25 MG tablet Take 1 tablet (25 mg total) by mouth daily.  30 tablet  5  . levothyroxine (SYNTHROID, LEVOTHROID) 100 MCG tablet Take 1 tablet (100 mcg total) by mouth daily.  30 tablet  5  . loratadine (CLARITIN) 10 MG tablet Take 1 tablet (10 mg total) by mouth daily.  30 tablet  11  . losartan (COZAAR) 100 MG tablet Take 1 tablet (100 mg total) by mouth daily.  30 tablet  5  . metoprolol succinate (TOPROL-XL) 100 MG 24 hr tablet Take 1 tablet (100 mg total) by mouth daily. Take with or immediately following a meal.  30 tablet  5  . mometasone (NASONEX) 50 MCG/ACT nasal spray Place 1 spray into the nose daily.      . sodium chloride (OCEAN NASAL SPRAY) 0.65 % nasal spray Place 2 sprays into the nose as needed for congestion.  45 mL  11  . Vitamin D, Ergocalciferol, (DRISDOL) 50000 UNITS CAPS Take 1 capsule (50,000 Units total) by mouth every 7 (seven) days. Take on tuesday  4 capsule  5   No current facility-administered medications for this visit.   Family History  Problem Relation Age of Onset  . Diabetes Mother   . Stroke Mother   . Cancer Mother  mother <50 at diagnosis (breast); h/o oral cancer  . Diabetes Father   . Heart disease Father   . Stroke Father   . Hypertension Father   . Diabetes Sister   . Diabetes Brother   . Colon cancer Brother   . Diabetes Paternal Uncle   . Cancer Brother     lung  . Cancer Brother     throat  . Esophageal cancer Neg Hx   . Rectal cancer Neg Hx   . Stomach cancer Neg Hx    History   Social History  . Marital Status: Divorced    Spouse Name: N/A    Number of Children: N/A  . Years of Education: N/A   Social History Main Topics  . Smoking status: Never Smoker   . Smokeless tobacco: Never Used  . Alcohol Use: No  . Drug Use: No  . Sexual Activity: None   Other Topics Concern  . None   Social History Narrative  . None   Review of Systems: Review of Systems  Constitutional: Negative for fever, chills, weight  loss and malaise/fatigue.  HENT: Negative for ear pain, nosebleeds, congestion, sore throat, tinnitus and ear discharge.   Eyes: Positive for blurred vision and photophobia. Negative for double vision and pain.  Respiratory: Negative for cough, sputum production, shortness of breath and wheezing.   Cardiovascular: Negative for chest pain, palpitations and leg swelling.  Gastrointestinal: Negative for heartburn, nausea, vomiting, abdominal pain, diarrhea and constipation.  Genitourinary: Negative for dysuria, urgency and frequency.  Musculoskeletal: Negative for myalgias.  Neurological: Positive for sensory change (numbness on R side of face) and headaches. Negative for dizziness, tingling, focal weakness, seizures, loss of consciousness and weakness.  Psychiatric/Behavioral: Negative for depression.    Objective:  Physical Exam: Filed Vitals:   04/24/13 1420  BP: 166/75  Pulse: 61  Temp: 97.5 F (36.4 C)  TempSrc: Oral  Height: 5\' 2"  (1.575 m)  Weight: 190 lb 12.8 oz (86.546 kg)  SpO2: 99%  Physical Exam  Nursing note and vitals reviewed. Constitutional: She is oriented to person, place, and time and well-developed, well-nourished, and in no distress. No distress.  HENT:  Head: Normocephalic and atraumatic.  Mouth/Throat: No oropharyngeal exudate.  Eyes: Conjunctivae are normal.  Cardiovascular: Normal rate, regular rhythm and normal heart sounds.   No murmur heard. Pulmonary/Chest: Effort normal and breath sounds normal. No respiratory distress. She has no wheezes.  Abdominal: Soft. Bowel sounds are normal. She exhibits no distension. There is no tenderness.  Musculoskeletal: She exhibits no edema.  Neurological: She is alert and oriented to person, place, and time. She has normal reflexes. A cranial nerve deficit (decreased sensation over V1,V2, and V3 dermatones on the right) is present.  Normal finger to nose, 5/5 upper and lower extremity strength and sensation b/l.  Skin:  She is not diaphoretic.     Assessment & Plan:   See Problem Based Assessment and Plan

## 2013-04-24 NOTE — Patient Instructions (Signed)
1.  Please resume taking Aspirin 325mg  a day immediately 2.  We will schedule you an appointment for a Head CT and call you with the results. 3.  Resume taking your regular medications including Claritin, Nasonex, and Nasal Saline.  If you sinus pressure is not better in 1 week we may consider trying an antibiotic so please call in.

## 2013-04-24 NOTE — Assessment & Plan Note (Addendum)
Patient has not been taking her Claritin or Nasonex with regularity. Physical exam findings were only of mild maxillary tenderness. Unlikely a bacterial sinusitis. Patient's Nasonex and Claritin was refilled and patient was encouraged to begin taking. Patient instructed to sinus pain does not improve in one week she should call and we will consider antibiotic therapy. CT of the head to rule out stroke may also provide additional information about sinusitis.

## 2013-04-24 NOTE — Assessment & Plan Note (Addendum)
Given patient's history of stroke, and the fact that she has not taken her aspirin in over a month, I am concerned that patient's mild headache, numbness of face, blurry vision could represent a minor stroke. As the onset of these symptoms for 3 weeks ago, I do not think this warrants an inpatient workup. We'll obtain a noncontrast CT of the head to evaluate for stroke. Patient told to immediately resume ASA 325 mg therapy.

## 2013-04-25 ENCOUNTER — Ambulatory Visit (HOSPITAL_COMMUNITY)
Admission: RE | Admit: 2013-04-25 | Discharge: 2013-04-25 | Disposition: A | Discharge status: Home / Self Care | Payer: No Typology Code available for payment source | Source: Ambulatory Visit | Attending: Internal Medicine | Admitting: Internal Medicine

## 2013-04-25 DIAGNOSIS — R200 Anesthesia of skin: Secondary | ICD-10-CM

## 2013-04-25 DIAGNOSIS — G319 Degenerative disease of nervous system, unspecified: Secondary | ICD-10-CM | POA: Insufficient documentation

## 2013-04-25 DIAGNOSIS — H538 Other visual disturbances: Secondary | ICD-10-CM | POA: Insufficient documentation

## 2013-04-25 DIAGNOSIS — R209 Unspecified disturbances of skin sensation: Secondary | ICD-10-CM | POA: Insufficient documentation

## 2013-04-25 NOTE — Progress Notes (Signed)
I saw and evaluated the patient.  I personally confirmed the key portions of the history and exam documented by Dr. Mikey Bussing and I reviewed pertinent patient test results.  The assessment, diagnosis, and plan were formulated together and I agree with the documentation in the resident's note. Agree with Dr Mikey Bussing that outpt W/U is indicted since sx present at least 3 weeks.

## 2013-05-02 ENCOUNTER — Telehealth: Payer: Self-pay | Admitting: Gastroenterology

## 2013-05-02 NOTE — Telephone Encounter (Signed)
Message copied by Arna Snipe on Wed May 02, 2013  4:03 PM ------      Message from: Annett Fabian      Created: Mon Mar 26, 2013  1:24 PM      Regarding: RE: Needs your Opinion       I think if she was originally scheduled with [place] would be ok to let her reschedule.              Sheri      ----- Message -----         From: Arna Snipe         Sent: 03/26/2013  11:15 AM           To: Rossie Muskrat, RN      Subject: Needs your Opinion                                       Wants to reschedule Colon that was scheduled in April when we were on call for unassigned call. Can we reschedule this or should it go to who is on call for this month. We have never seen patient. She has [employer]       ------

## 2013-05-02 NOTE — Telephone Encounter (Signed)
Left message to schedule appt

## 2013-05-02 NOTE — Telephone Encounter (Signed)
Left Message for Madison Jefferson and also called the patient and left her a voicemail to call back for appointment.

## 2013-05-25 ENCOUNTER — Other Ambulatory Visit: Payer: Self-pay | Admitting: *Deleted

## 2013-05-25 DIAGNOSIS — I10 Essential (primary) hypertension: Secondary | ICD-10-CM

## 2013-05-25 MED ORDER — LOSARTAN POTASSIUM 100 MG PO TABS: 100.0000 mg | ORAL_TABLET | Freq: Every day | ORAL | Status: DC

## 2013-06-28 ENCOUNTER — Other Ambulatory Visit: Payer: Self-pay | Admitting: *Deleted

## 2013-06-28 DIAGNOSIS — E038 Other specified hypothyroidism: Secondary | ICD-10-CM

## 2013-06-28 MED ORDER — LEVOTHYROXINE SODIUM 100 MCG PO TABS: 100.0000 ug | ORAL_TABLET | Freq: Every day | ORAL | Status: DC

## 2013-07-30 ENCOUNTER — Other Ambulatory Visit: Payer: Self-pay | Admitting: *Deleted

## 2013-07-30 DIAGNOSIS — I10 Essential (primary) hypertension: Secondary | ICD-10-CM

## 2013-07-31 MED ORDER — HYDROCHLOROTHIAZIDE 25 MG PO TABS: 25.0000 mg | ORAL_TABLET | Freq: Every day | ORAL | Status: DC

## 2013-08-01 NOTE — Telephone Encounter (Signed)
Rx faxed in.

## 2013-08-24 ENCOUNTER — Other Ambulatory Visit: Payer: Self-pay | Admitting: *Deleted

## 2013-08-24 DIAGNOSIS — E038 Other specified hypothyroidism: Secondary | ICD-10-CM

## 2013-08-24 MED ORDER — LEVOTHYROXINE SODIUM 100 MCG PO TABS: 100.0000 ug | ORAL_TABLET | Freq: Every day | ORAL | Status: DC

## 2013-08-24 NOTE — Telephone Encounter (Signed)
She has an appt on the 29th.

## 2013-09-11 ENCOUNTER — Ambulatory Visit: Payer: Self-pay

## 2013-09-13 ENCOUNTER — Ambulatory Visit: Payer: Self-pay

## 2013-09-13 ENCOUNTER — Encounter: Payer: Self-pay | Admitting: Internal Medicine

## 2013-09-20 ENCOUNTER — Ambulatory Visit: Payer: Self-pay

## 2013-09-20 ENCOUNTER — Ambulatory Visit (INDEPENDENT_AMBULATORY_CARE_PROVIDER_SITE_OTHER): Payer: Medicare Other | Admitting: Internal Medicine

## 2013-09-20 ENCOUNTER — Encounter: Payer: Self-pay | Admitting: Internal Medicine

## 2013-09-20 ENCOUNTER — Other Ambulatory Visit (HOSPITAL_COMMUNITY)
Admission: RE | Admit: 2013-09-20 | Discharge: 2013-09-20 | Disposition: A | Discharge status: Home / Self Care | Payer: Medicare Other | Source: Ambulatory Visit | Attending: Internal Medicine | Admitting: Internal Medicine

## 2013-09-20 VITALS — BP 139/65 | HR 73 | Temp 97.1°F | Ht 61.0 in | Wt 191.5 lb

## 2013-09-20 DIAGNOSIS — Z1151 Encounter for screening for human papillomavirus (HPV): Secondary | ICD-10-CM | POA: Insufficient documentation

## 2013-09-20 DIAGNOSIS — R739 Hyperglycemia, unspecified: Secondary | ICD-10-CM | POA: Insufficient documentation

## 2013-09-20 DIAGNOSIS — J45909 Unspecified asthma, uncomplicated: Secondary | ICD-10-CM

## 2013-09-20 DIAGNOSIS — I10 Essential (primary) hypertension: Secondary | ICD-10-CM

## 2013-09-20 DIAGNOSIS — R7309 Other abnormal glucose: Secondary | ICD-10-CM

## 2013-09-20 DIAGNOSIS — Z0000 Encounter for general adult medical examination without abnormal findings: Secondary | ICD-10-CM

## 2013-09-20 DIAGNOSIS — E038 Other specified hypothyroidism: Secondary | ICD-10-CM

## 2013-09-20 DIAGNOSIS — Z01419 Encounter for gynecological examination (general) (routine) without abnormal findings: Secondary | ICD-10-CM | POA: Insufficient documentation

## 2013-09-20 DIAGNOSIS — Z23 Encounter for immunization: Secondary | ICD-10-CM

## 2013-09-20 DIAGNOSIS — R7303 Prediabetes: Secondary | ICD-10-CM | POA: Insufficient documentation

## 2013-09-20 DIAGNOSIS — E119 Type 2 diabetes mellitus without complications: Secondary | ICD-10-CM | POA: Insufficient documentation

## 2013-09-20 LAB — GLUCOSE, CAPILLARY: GLUCOSE-CAPILLARY: 142 mg/dL — AB (ref 70–99)

## 2013-09-20 LAB — POCT GLYCOSYLATED HEMOGLOBIN (HGB A1C): Hemoglobin A1C: 5.5

## 2013-09-20 NOTE — Progress Notes (Signed)
Patient ID: Madison Jefferson, female   DOB: 13-Jun-1951, 63 y.o.   MRN: [id number] Subjective:   [name]   DOB: July 22, 1951 63 y.o.   MRN: [id number]  CC:   Follow up visit.  HPI:  [name] is a 63 y.o. lady with past medical history as outlined below, who presents for a followup visit today  1. 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 Her CBG was 145 which was approximately 30 to 60 minutes after eating a little snacks. Patient does not have symptoms for hyperglycemia, such as polyuria or feeling thirsty all the times. A1c is 5.5 in clinic today.   2. HTN: bp is 139/65 mmHg today. Patient was supposed to take amlodipine, hydrochlorothiazide, Cozaar and metoprolol. The patient is not taking hydrochlorothiazide. She brought in all her medication bottles today, which does not include hydrochlorothiazide. She said that she is not taking this medications. Patient does not have chest pain, shortness of breath or leg edema the  3.  Hypothyroidism: Patient is currently taking Synthroid 100 mcg daily. Her last TSH was 0.794 on 07/03/12.   4. Asthma: Patient is currently using albuterol inhaler when necessary. She does not have cough, shortness of breath, chest pain or wheezing.  ROS:  Denies fever, chills, fatigue, headaches, cough, chest pain, SOB,  abdominal pain, diarrhea, constipation, dysuria, urgency, frequency, hematuria, joint pain or leg swelling.    Past Medical History  Diagnosis Date  . Hypertension   . Allergy   . Asthma   . GERD (gastroesophageal reflux disease)   . Vitamin D deficiency   . Fatty liver   . Thyroid disease     hypothyroid  . Urinary incontinence     mixed urge and stress   . Lumbar radiculopathy   . Atrial fibrillation     h/o of isolated AF associated with hypothyroidism   . Diverticulosis     cecum  .  Arthritis   . TIA (transient ischemic attack)     2006  . Diverticulosis     colonoscopy 2002 [facility]   Current Outpatient Prescriptions  Medication Sig Dispense Refill  . albuterol (PROVENTIL HFA;VENTOLIN HFA) 108 (90 BASE) MCG/ACT inhaler Inhale 2 puffs into the lungs every 6 (six) hours as needed.  1 Inhaler  2  . amLODipine (NORVASC) 5 MG tablet Take 1 tablet (5 mg total) by mouth daily.  30 tablet  11  . aspirin 325 MG tablet Take 1 tablet (325 mg total) by mouth daily.  30 tablet  11  . hydrochlorothiazide (HYDRODIURIL) 25 MG tablet Take 1 tablet (25 mg total) by mouth daily.  30 tablet  5  . levothyroxine (SYNTHROID, LEVOTHROID) 100 MCG tablet Take 1 tablet (100 mcg total) by mouth daily.  30 tablet  5  . loratadine (CLARITIN) 10 MG tablet Take 1 tablet (10 mg total) by mouth daily.  30 tablet  11  . losartan (COZAAR) 100 MG tablet Take 1 tablet (100 mg total) by mouth daily.  30 tablet  5  . metoprolol succinate (TOPROL-XL) 100 MG 24 hr tablet Take 1 tablet (100 mg total) by mouth daily. Take with or immediately following a meal.  30 tablet  5  . mometasone (NASONEX) 50 MCG/ACT nasal spray Place 1 spray into the nose daily.  17 g  5  .  sodium chloride (OCEAN NASAL SPRAY) 0.65 % nasal spray Place 2 sprays into the nose as needed for congestion.  45 mL  11  . Vitamin D, Ergocalciferol, (DRISDOL) 50000 UNITS CAPS Take 1 capsule (50,000 Units total) by mouth every 7 (seven) days. Take on tuesday  4 capsule  5   No current facility-administered medications for this visit.   Family History  Problem Relation Age of Onset  . Diabetes Mother   . Stroke Mother   . Cancer Mother     mother <50 at diagnosis (breast); h/o oral cancer  . Diabetes Father   . Heart disease Father   . Stroke Father   . Hypertension Father   . Diabetes Sister   . Diabetes Brother   . Colon cancer Brother   . Diabetes Paternal Uncle   . Cancer Brother     lung  . Cancer Brother     throat  .  Esophageal cancer Neg Hx   . Rectal cancer Neg Hx   . Stomach cancer Neg Hx    History   Social History  . Marital Status: Divorced    Spouse Name: N/A    Number of Children: N/A  . Years of Education: N/A   Social History Main Topics  . Smoking status: Never Smoker   . Smokeless tobacco: Never Used  . Alcohol Use: No  . Drug Use: No  . Sexual Activity: None   Other Topics Concern  . None   Social History Narrative  . None    Review of Systems: Full 14-point review of systems otherwise negative. See HPI.   Objective:  Physical Exam: Filed Vitals:   09/20/13 0845  BP: 139/65  Pulse: 73  Temp: 97.1 F (36.2 C)  TempSrc: Oral  Height: 5\' 1"  (1.549 m)  Weight: 191 lb 8 oz (86.864 kg)  SpO2: 98%   Constitutional: Vital signs reviewed.  Patient is a well-developed and well-nourished, in no acute distress and cooperative with exam.   HEENT:  Head: Normocephalic and atraumatic Mouth: no erythema or exudates, MMM Eyes: PERRL, EOMI, conjunctivae normal, No scleral icterus.  Neck: Supple, Trachea midline normal ROM, No JVD  Cardiovascular: RRR, S1 normal, S2 normal, no MRG, pulses symmetric and intact bilaterally Pulmonary/Chest: CTAB, no wheezes, rales, or rhonchi Abdominal: Soft. Non-tender, non-distended, bowel sounds are normal, no masses, organomegaly, or guarding present.  GU: no CVA tenderness Musculoskeletal: No joint deformities, erythema, or stiffness, ROM full and non-tender Extremities: No leg edema Hematology: no cervical, inginal, or axillary adenopathy.  Neurological: A&O x3, Strength is normal and symmetric bilaterally, cranial nerve II-XII are grossly intact, no focal motor deficit, sensory intact to light touch bilaterally.  Skin: Warm, dry and intact. No rash, cyanosis, or clubbing.  Psychiatric: Normal mood and affect. No suicidal or homicidal ideation.  Assessment & Plan:

## 2013-09-20 NOTE — Assessment & Plan Note (Signed)
-  will do flu shot today -Did Pap smear today -Patient refused colonoscopy today, will postpone.

## 2013-09-20 NOTE — Patient Instructions (Addendum)
1. you do not have diabetes, however it is always good for your health to eat healthy food and do exercise regularly.  2. Please take all medications as prescribed.  3. If you have worsening of your symptoms or new symptoms arise, please call the clinic ([phone]), or go to the ER immediately if symptoms are severe. Please bring in all your medication bottles with you in next visit.

## 2013-09-20 NOTE — Assessment & Plan Note (Signed)
Stable, no signs of acute exacerbation. Lung auscultation is clear bilaterally. No chest pain, shortness of breath, coughing or wheezing.  -will continue current regimen

## 2013-09-20 NOTE — Assessment & Plan Note (Signed)
Patient was accidentally found to have slightly elevated blood sugar at 145. She does not have symptoms for diabetes mellitus. Her A1c is 5.5, indicating she doesn't have diabetes mellitus.  -Patient was reassured and advised to eat healthy diet and do more exercise.

## 2013-09-20 NOTE — Assessment & Plan Note (Signed)
BP Readings from Last 3 Encounters:  09/20/13 139/65  04/24/13 166/75  03/01/13 142/67    Lab Results  Component Value Date   NA 141 10/12/2012   K 3.5 10/12/2012   CREATININE 1.03 10/12/2012    Assessment: Blood pressure control: controlled Progress toward BP goal:  at goal Comments:   Plan: Medications:  continue current medications Educational resources provided: brochure;handout;video Self management tools provided:   Other plans: Patient was supposed to take amlodipine, hydrochlorothiazide, Cozaar and metoprolol. The patient is not taking hydrochlorothiazide. She brought in all her medication bottles today, which does not include hydrochlorothiazide. She said that she is not taking this medications. Since her bp is normal, will not restart this medication today.

## 2013-09-20 NOTE — Assessment & Plan Note (Signed)
Stable. previous TSH was 0.794 on 11/181/3. Currently on 100 mcg of Synthroid. -will continue current regimen.

## 2013-09-21 DIAGNOSIS — Z23 Encounter for immunization: Secondary | ICD-10-CM

## 2013-09-24 NOTE — Progress Notes (Signed)
Case discussed with Dr. Blaine Hamper at the time of the visit.  We reviewed the resident's history and exam and pertinent patient test results.  I agree with the assessment, diagnosis, and plan of care documented in the resident's note.

## 2013-09-25 ENCOUNTER — Other Ambulatory Visit: Payer: Self-pay | Admitting: Internal Medicine

## 2013-09-27 ENCOUNTER — Encounter: Payer: Self-pay | Admitting: Internal Medicine

## 2013-12-04 ENCOUNTER — Other Ambulatory Visit: Payer: Self-pay | Admitting: *Deleted

## 2013-12-04 DIAGNOSIS — I10 Essential (primary) hypertension: Secondary | ICD-10-CM

## 2013-12-04 MED ORDER — AMLODIPINE BESYLATE 5 MG PO TABS: 5.0000 mg | ORAL_TABLET | Freq: Every day | ORAL | Status: DC

## 2013-12-12 ENCOUNTER — Ambulatory Visit (INDEPENDENT_AMBULATORY_CARE_PROVIDER_SITE_OTHER): Payer: Medicare HMO | Admitting: Internal Medicine

## 2013-12-12 ENCOUNTER — Encounter: Payer: Self-pay | Admitting: Internal Medicine

## 2013-12-12 VITALS — BP 162/77 | HR 58 | Temp 97.0°F | Ht 61.0 in | Wt 180.5 lb

## 2013-12-12 DIAGNOSIS — E559 Vitamin D deficiency, unspecified: Secondary | ICD-10-CM

## 2013-12-12 DIAGNOSIS — Z0000 Encounter for general adult medical examination without abnormal findings: Secondary | ICD-10-CM

## 2013-12-12 DIAGNOSIS — R42 Dizziness and giddiness: Secondary | ICD-10-CM

## 2013-12-12 DIAGNOSIS — E038 Other specified hypothyroidism: Secondary | ICD-10-CM

## 2013-12-12 DIAGNOSIS — E890 Postprocedural hypothyroidism: Secondary | ICD-10-CM

## 2013-12-12 DIAGNOSIS — Z23 Encounter for immunization: Secondary | ICD-10-CM

## 2013-12-12 DIAGNOSIS — I10 Essential (primary) hypertension: Secondary | ICD-10-CM

## 2013-12-12 DIAGNOSIS — J45909 Unspecified asthma, uncomplicated: Secondary | ICD-10-CM

## 2013-12-12 LAB — BASIC METABOLIC PANEL WITH GFR
BUN: 15 mg/dL (ref 6–23)
CALCIUM: 8.8 mg/dL (ref 8.4–10.5)
CHLORIDE: 102 meq/L (ref 96–112)
CO2: 27 mEq/L (ref 19–32)
Creat: 0.79 mg/dL (ref 0.50–1.10)
GFR, EST NON AFRICAN AMERICAN: 79 mL/min
GFR, Est African American: >89 mL/min
Glucose, Bld: 118 mg/dL — ABNORMAL HIGH (ref 70–99)
Potassium: 3.4 mEq/L — ABNORMAL LOW (ref 3.5–5.3)
Sodium: 139 mEq/L (ref 135–145)

## 2013-12-12 LAB — TSH: TSH: 5.651 u[IU]/mL — AB (ref 0.350–4.500)

## 2013-12-12 MED ORDER — LEVOTHYROXINE SODIUM 100 MCG PO TABS: 100.0000 ug | ORAL_TABLET | Freq: Every day | ORAL | Status: DC

## 2013-12-12 MED ORDER — METOPROLOL SUCCINATE ER 100 MG PO TB24: ORAL_TABLET | ORAL | Status: DC

## 2013-12-12 MED ORDER — HYDROCHLOROTHIAZIDE 25 MG PO TABS: 25.0000 mg | ORAL_TABLET | Freq: Every day | ORAL | Status: DC

## 2013-12-12 MED ORDER — LOSARTAN POTASSIUM 100 MG PO TABS: 100.0000 mg | ORAL_TABLET | Freq: Every day | ORAL | Status: DC

## 2013-12-12 MED ORDER — PNEUMOCOCCAL VAC POLYVALENT 25 MCG/0.5ML IJ INJ
0.5000 mL | INJECTION | Freq: Once | INTRAMUSCULAR | Status: DC
Start: 2013-12-12 — End: 2013-12-12

## 2013-12-12 NOTE — Assessment & Plan Note (Addendum)
Will check orthostatics (negative) today etiology could be due to overexertion at work (working 12 days straight, aging, medications)  RTC if not improving and consider vestibular rehab.  Given info about dizziness

## 2013-12-12 NOTE — Patient Instructions (Signed)
General Instructions: Follow up in 4-6 months, sooner if needed  Read information below    Treatment Goals:  Goals (1 Years of Data) as of 12/12/13         As of Today 09/20/13 04/24/13 03/01/13 11/17/12     Blood Pressure    . Blood Pressure < 140/90  132/72 139/65 166/75 142/67 160/72     Weight    . Weight < 150 lb (68.04 kg)  180 lb 8 oz (81.874 kg) 191 lb 8 oz (86.864 kg) 190 lb 12.8 oz (86.546 kg) 185 lb 3.2 oz (84.006 kg) 186 lb 14.4 oz (84.777 kg)      Progress Toward Treatment Goals:  Treatment Goal 12/12/2013  Blood pressure at goal    Self Care Goals & Plans:  Self Care Goal 12/12/2013  Manage my medications take my medicines as prescribed; bring my medications to every visit; refill my medications on time; follow the sick day instructions if I am sick  Monitor my health -  Eat healthy foods eat more vegetables; eat fruit for snacks and desserts; eat foods that are low in salt; eat baked foods instead of fried foods; eat smaller portions; drink diet soda or water instead of juice or soda  Be physically active find an activity I enjoy  Meeting treatment goals maintain the current self-care plan    No flowsheet data found.   Care Management & Community Referrals:  Referral 12/12/2013  Referrals made for care management support none needed  Referrals made to community resources none     Hypertension As your heart beats, it forces blood through your arteries. This force is your blood pressure. If the pressure is too high, it is called hypertension (HTN) or high blood pressure. HTN is dangerous because you may have it and not know it. High blood pressure may mean that your heart has to work harder to pump blood. Your arteries may be narrow or stiff. The extra work puts you at risk for heart disease, stroke, and other problems.  Blood pressure consists of two numbers, a higher number over a lower, 110/72, for example. It is stated as "110 over 72." The ideal is below 120 for  the top number (systolic) and under 80 for the bottom (diastolic). Write down your blood pressure today. You should pay close attention to your blood pressure if you have certain conditions such as:  Heart failure.  Prior heart attack.  Diabetes  Chronic kidney disease.  Prior stroke.  Multiple risk factors for heart disease. To see if you have HTN, your blood pressure should be measured while you are seated with your arm held at the level of the heart. It should be measured at least twice. A one-time elevated blood pressure reading (especially in the Emergency Department) does not mean that you need treatment. There may be conditions in which the blood pressure is different between your right and left arms. It is important to see your caregiver soon for a recheck. Most people have essential hypertension which means that there is not a specific cause. This type of high blood pressure may be lowered by changing lifestyle factors such as:  Stress.  Smoking.  Lack of exercise.  Excessive weight.  Drug/tobacco/alcohol use.  Eating less salt. Most people do not have symptoms from high blood pressure until it has caused damage to the body. Effective treatment can often prevent, delay or reduce that damage. TREATMENT  When a cause has been identified, treatment for high  blood pressure is directed at the cause. There are a large number of medications to treat HTN. These fall into several categories, and your caregiver will help you select the medicines that are best for you. Medications may have side effects. You should review side effects with your caregiver. If your blood pressure stays high after you have made lifestyle changes or started on medicines,   Your medication(s) may need to be changed.  Other problems may need to be addressed.  Be certain you understand your prescriptions, and know how and when to take your medicine.  Be sure to follow up with your caregiver within the  time frame advised (usually within two weeks) to have your blood pressure rechecked and to review your medications.  If you are taking more than one medicine to lower your blood pressure, make sure you know how and at what times they should be taken. Taking two medicines at the same time can result in blood pressure that is too low. SEEK IMMEDIATE MEDICAL CARE IF:  You develop a severe headache, blurred or changing vision, or confusion.  You have unusual weakness or numbness, or a faint feeling.  You have severe chest or abdominal pain, vomiting, or breathing problems. MAKE SURE YOU:   Understand these instructions.  Will watch your condition.  Will get help right away if you are not doing well or get worse. Document Released: 08/02/2005 Document Revised: 10/25/2011 Document Reviewed: 03/22/2008 [facility] Patient Information 2014 [place].  DASH Diet The DASH diet stands for "Dietary Approaches to Stop Hypertension." It is a healthy eating plan that has been shown to reduce high blood pressure (hypertension) in as little as 14 days, while also possibly providing other significant health benefits. These other health benefits include reducing the risk of breast cancer after menopause and reducing the risk of type 2 diabetes, heart disease, colon cancer, and stroke. Health benefits also include weight loss and slowing kidney failure in patients with chronic kidney disease.  DIET GUIDELINES  Limit salt (sodium). Your diet should contain less than 1500 mg of sodium daily.  Limit refined or processed carbohydrates. Your diet should include mostly whole grains. Desserts and added sugars should be used sparingly.  Include small amounts of heart-healthy fats. These types of fats include nuts, oils, and tub margarine. Limit saturated and trans fats. These fats have been shown to be harmful in the body. CHOOSING FOODS  The following food groups are based on a 2000 calorie diet. See your  Registered Dietitian for individual calorie needs. Grains and Grain Products (6 to 8 servings daily)  Eat More Often: Whole-wheat bread, brown rice, whole-grain or wheat pasta, quinoa, popcorn without added fat or salt (air popped).  Eat Less Often: White bread, white pasta, white rice, cornbread. Vegetables (4 to 5 servings daily)  Eat More Often: Fresh, frozen, and canned vegetables. Vegetables may be raw, steamed, roasted, or grilled with a minimal amount of fat.  Eat Less Often/Avoid: Creamed or fried vegetables. Vegetables in a cheese sauce. Fruit (4 to 5 servings daily)  Eat More Often: All fresh, canned (in natural juice), or frozen fruits. Dried fruits without added sugar. One hundred percent fruit juice ( cup [237 mL] daily).  Eat Less Often: Dried fruits with added sugar. Canned fruit in light or heavy syrup. [employer], Fish, and Poultry (2 servings or less daily. One serving is 3 to 4 oz [85-114 g]).  Eat More Often: Ninety percent or leaner ground beef, tenderloin, sirloin. Round  cuts of beef, chicken breast, [place] breast. All fish. Grill, bake, or broil your meat. Nothing should be fried.  Eat Less Often/Avoid: Fatty cuts of meat, [place], or chicken leg, thigh, or wing. Fried cuts of meat or fish. Dairy (2 to 3 servings)  Eat More Often: Low-fat or fat-free milk, low-fat plain or light yogurt, reduced-fat or part-skim cheese.  Eat Less Often/Avoid: Milk (whole, 2%).Whole milk yogurt. Full-fat cheeses. Nuts, Seeds, and Legumes (4 to 5 servings per week)  Eat More Often: All without added salt.  Eat Less Often/Avoid: Salted nuts and seeds, canned beans with added salt. Fats and Sweets (limited)  Eat More Often: Vegetable oils, tub margarines without trans fats, sugar-free gelatin. Mayonnaise and salad dressings.  Eat Less Often/Avoid: Coconut oils, palm oils, butter, stick margarine, cream, half and half, cookies, candy, pie. FOR MORE INFORMATION The Dash Diet  Eating Plan: www.dashdiet.org Document Released: 07/22/2011 Document Revised: 10/25/2011 Document Reviewed: 07/22/2011 [facility] Patient Information 2014 [place], [place].  Dizziness Dizziness is a common problem. It is a feeling of unsteadiness or lightheadedness. You may feel like you are about to faint. Dizziness can lead to injury if you stumble or fall. A person of any age group can suffer from dizziness, but dizziness is more common in older adults. CAUSES  Dizziness can be caused by many different things, including:  Middle ear problems.  Standing for too long.  Infections.  An allergic reaction.  Aging.  An emotional response to something, such as the sight of blood.  Side effects of medicines.  Fatigue.  Problems with circulation or blood pressure.  Excess use of alcohol, medicines, or illegal drug use.  Breathing too fast (hyperventilation).  An arrhythmia or problems with your heart rhythm.  Low red blood cell count (anemia).  Pregnancy.  Vomiting, diarrhea, fever, or other illnesses that cause dehydration.  Diseases or conditions such as Parkinson's disease, high blood pressure (hypertension), diabetes, and thyroid problems.  Exposure to extreme heat. DIAGNOSIS  To find the cause of your dizziness, your caregiver may do a physical exam, lab tests, radiologic imaging scans, or an electrocardiography test (ECG).  TREATMENT  Treatment of dizziness depends on the cause of your symptoms and can vary greatly. HOME CARE INSTRUCTIONS   Drink enough fluids to keep your urine clear or pale yellow. This is especially important in very hot weather. In the elderly, it is also important in cold weather.  If your dizziness is caused by medicines, take them exactly as directed. When taking blood pressure medicines, it is especially important to get up slowly.  Rise slowly from chairs and steady yourself until you feel okay.  In the morning, first sit up on the side of  the bed. When this seems okay, stand slowly while holding onto something until you know your balance is fine.  If you need to stand in one place for a long time, be sure to move your legs often. Tighten and relax the muscles in your legs while standing.  If dizziness continues to be a problem, have someone stay with you for a day or two. Do this until you feel you are well enough to stay alone. Have the person call your caregiver if he or she notices changes in you that are concerning.  Do not drive or use heavy machinery if you feel dizzy.  Do not drink alcohol. SEEK IMMEDIATE MEDICAL CARE IF:   Your dizziness or lightheadedness gets worse.  You feel nauseous or vomit.  You develop  problems with talking, walking, weakness, or using your arms, hands, or legs.  You are not thinking clearly or you have difficulty forming sentences. It may take a friend or family member to determine if your thinking is normal.  You develop chest pain, abdominal pain, shortness of breath, or sweating.  Your vision changes.  You notice any bleeding.  You have side effects from medicine that seems to be getting worse rather than better. MAKE SURE YOU:   Understand these instructions.  Will watch your condition.  Will get help right away if you are not doing well or get worse. Document Released: 01/26/2001 Document Revised: 10/25/2011 Document Reviewed: 02/19/2011 [facility] Patient Information 2014 [place], [place].  Exercise to Lose Weight Exercise and a healthy diet may help you lose weight. Your doctor may suggest specific exercises. EXERCISE IDEAS AND TIPS  Choose low-cost things you enjoy doing, such as walking, bicycling, or exercising to workout videos.  Take stairs instead of the elevator.  Walk during your lunch break.  Park your car further away from work or school.  Go to a gym or an exercise class.  Start with 5 to 10 minutes of exercise each day. Build up to 30 minutes of  exercise 4 to 6 days a week.  Wear shoes with good support and comfortable clothes.  Stretch before and after working out.  Work out until you breathe harder and your heart beats faster.  Drink extra water when you exercise.  Do not do so much that you hurt yourself, feel dizzy, or get very short of breath. Exercises that burn about 150 calories:  Running 1  miles in 15 minutes.  Playing volleyball for 45 to 60 minutes.  Washing and waxing a car for 45 to 60 minutes.  Playing touch football for 45 minutes.  Walking 1  miles in 35 minutes.  Pushing a stroller 1  miles in 30 minutes.  Playing basketball for 30 minutes.  Raking leaves for 30 minutes.  Bicycling 5 miles in 30 minutes.  Walking 2 miles in 30 minutes.  Dancing for 30 minutes.  Shoveling snow for 15 minutes.  Swimming laps for 20 minutes.  Walking up stairs for 15 minutes.  Bicycling 4 miles in 15 minutes.  Gardening for 30 to 45 minutes.  Jumping rope for 15 minutes.  Washing windows or floors for 45 to 60 minutes. Document Released: 09/04/2010 Document Revised: 10/25/2011 Document Reviewed: 09/04/2010 [facility] Patient Information 2014 [place], [place].

## 2013-12-12 NOTE — Addendum Note (Signed)
Addended by: Julieanne Manson A on: 12/12/2013 10:26 AM   Modules accepted: Orders

## 2013-12-12 NOTE — Assessment & Plan Note (Addendum)
BP Readings from Last 3 Encounters:  12/12/13 132/72  09/20/13 139/65  04/24/13 166/75    Lab Results  Component Value Date   NA 141 10/12/2012   K 3.5 10/12/2012   CREATININE 1.03 10/12/2012    Assessment: Blood pressure control: controlled Progress toward BP goal:  at goal Comments: none  Plan: Medications:  continue current medications Educational resources provided: brochure;handout;video Self management tools provided: other (see comments) Other plans: BMET today Rx refill of Cozaar 100, HCTZ 25, Metoprolol 100 XL F/u in 4-6 months

## 2013-12-12 NOTE — Assessment & Plan Note (Addendum)
Rx refill Levothyroxine today

## 2013-12-12 NOTE — Progress Notes (Signed)
   Subjective:    Patient ID: Madison Jefferson, female    DOB: August 06, 1951, 63 y.o.   MRN: [id number]  HPI Comments: 63 y.o PMH HTN (BP 132/72 ), hypothyroidism, vitamin D deficiency, carpel tunnel syndrome, allergic rhinitis, asthma, GERD, fatty liver disease, back pain with radiculopathy, diverticulosis, obesity, chronic back pain 2/2 pinched nerve.    She presents for follow up.   1) She c/o of lightheadedness with bending over at work w/in the last week.  She denies the room spinning. She denies ear problems 2) She needs Rx refills of Cozaar, HCTZ, Albuterol, Levothyroxine and would like them sent to [facility] 3) she request appt to eye MD for new glasses  4)She has chronic lower back pain which she takes Aleve with relief  HM: due for colonoscopy and pnuemonia vx   SH: Started working again at [employer] where she does a lot of walking and bending     Review of Systems  Respiratory: Negative for shortness of breath.   Cardiovascular: Negative for chest pain.  Gastrointestinal: Negative for constipation and blood in stool.  Genitourinary: Negative for dysuria and hematuria.  Musculoskeletal: Positive for back pain.       +chronic back pain due to pinched nerve   Neurological: Positive for light-headedness.       Lightheadedness with bending; no room spinning        Objective:   Physical Exam  Nursing note and vitals reviewed. Constitutional: She is oriented to person, place, and time. Vital signs are normal. She appears well-developed and well-nourished. She is cooperative. No distress.  HENT:  Head: Normocephalic and atraumatic.  Mouth/Throat: Oropharynx is clear and moist and mucous membranes are normal. No oropharyngeal exudate.  Eyes: Conjunctivae are normal. Pupils are equal, round, and reactive to light. Right eye exhibits no discharge. Left eye exhibits no discharge. No scleral icterus.  Cardiovascular: Normal rate, regular rhythm, S1 normal, S2 normal and  normal heart sounds.   No murmur heard. No lower ext edema   Pulmonary/Chest: Effort normal and breath sounds normal. No respiratory distress. She has no wheezes.  Abdominal: Soft. Bowel sounds are normal. There is no tenderness.  Neurological: She is alert and oriented to person, place, and time. Gait normal.  Skin: Skin is warm, dry and intact. No rash noted. She is not diaphoretic.  Psychiatric: She has a normal mood and affect. Her speech is normal and behavior is normal. Judgment and thought content normal. Cognition and memory are normal.          Assessment & Plan:  F/u in 4-6 months, sooner if needed

## 2013-12-12 NOTE — Assessment & Plan Note (Signed)
Pneumonia 23 vx today and referred for eye exam and colonoscopy

## 2013-12-12 NOTE — Assessment & Plan Note (Addendum)
Will check vit D level today  Pending labs will decide if will keep same dose Vit D or lower

## 2013-12-12 NOTE — Assessment & Plan Note (Addendum)
Stable

## 2013-12-13 LAB — VITAMIN D 25 HYDROXY (VIT D DEFICIENCY, FRACTURES): VIT D 25 HYDROXY: 35 ng/mL (ref 30–89)

## 2013-12-13 NOTE — Progress Notes (Signed)
INTERNAL MEDICINE TEACHING ATTENDING ADDENDUM - Shoua Ulloa, MD: I reviewed and discussed at the time of visit with the resident Dr. McLean, the patient's medical history, physical examination, diagnosis and results of tests and treatment and I agree with the patient's care as documented. 

## 2013-12-16 ENCOUNTER — Other Ambulatory Visit: Payer: Self-pay | Admitting: Internal Medicine

## 2013-12-16 ENCOUNTER — Encounter: Payer: Self-pay | Admitting: Internal Medicine

## 2013-12-16 DIAGNOSIS — E038 Other specified hypothyroidism: Secondary | ICD-10-CM

## 2013-12-16 MED ORDER — VITAMIN D (CHOLECALCIFEROL) 25 MCG (1000 UT) PO TABS: 1.0000 | ORAL_TABLET | Freq: Every day | ORAL | Status: DC

## 2013-12-28 ENCOUNTER — Other Ambulatory Visit: Payer: Self-pay | Admitting: Internal Medicine

## 2013-12-30 ENCOUNTER — Other Ambulatory Visit: Payer: Self-pay | Admitting: Internal Medicine

## 2014-01-02 ENCOUNTER — Other Ambulatory Visit: Payer: Self-pay | Admitting: Internal Medicine

## 2014-03-18 ENCOUNTER — Telehealth: Payer: Self-pay | Admitting: *Deleted

## 2014-03-24 ENCOUNTER — Other Ambulatory Visit: Payer: Self-pay | Admitting: Internal Medicine

## 2014-04-03 ENCOUNTER — Ambulatory Visit (INDEPENDENT_AMBULATORY_CARE_PROVIDER_SITE_OTHER): Payer: Commercial Managed Care - HMO | Admitting: Internal Medicine

## 2014-04-03 ENCOUNTER — Encounter: Payer: Self-pay | Admitting: Internal Medicine

## 2014-04-03 VITALS — BP 123/72 | HR 56 | Temp 97.9°F | Ht 61.0 in | Wt 177.2 lb

## 2014-04-03 DIAGNOSIS — E559 Vitamin D deficiency, unspecified: Secondary | ICD-10-CM

## 2014-04-03 DIAGNOSIS — I10 Essential (primary) hypertension: Secondary | ICD-10-CM

## 2014-04-03 DIAGNOSIS — J45909 Unspecified asthma, uncomplicated: Secondary | ICD-10-CM

## 2014-04-03 DIAGNOSIS — E038 Other specified hypothyroidism: Secondary | ICD-10-CM

## 2014-04-03 DIAGNOSIS — J309 Allergic rhinitis, unspecified: Secondary | ICD-10-CM

## 2014-04-03 LAB — TSH: TSH: 1.999 u[IU]/mL (ref 0.350–4.500)

## 2014-04-03 LAB — T4, FREE: Free T4: 1.13 ng/dL (ref 0.80–1.80)

## 2014-04-03 LAB — T3: T3, Total: 61 ng/dL — ABNORMAL LOW (ref 80.0–204.0)

## 2014-04-03 MED ORDER — LORATADINE 10 MG PO TABS: 10.0000 mg | ORAL_TABLET | Freq: Every day | ORAL | Status: DC

## 2014-04-03 MED ORDER — HYDROCHLOROTHIAZIDE 25 MG PO TABS: 25.0000 mg | ORAL_TABLET | Freq: Every day | ORAL | Status: DC

## 2014-04-03 MED ORDER — ALBUTEROL SULFATE HFA 108 (90 BASE) MCG/ACT IN AERS: 2.0000 | INHALATION_SPRAY | Freq: Four times a day (QID) | RESPIRATORY_TRACT | Status: DC | PRN

## 2014-04-03 MED ORDER — METOPROLOL SUCCINATE ER 100 MG PO TB24: 100.0000 mg | ORAL_TABLET | Freq: Every day | ORAL | Status: DC

## 2014-04-03 MED ORDER — ASPIRIN 325 MG PO TABS: 325.0000 mg | ORAL_TABLET | Freq: Every day | ORAL | Status: DC

## 2014-04-03 MED ORDER — VITAMIN D (ERGOCALCIFEROL) 1.25 MG (50000 UNIT) PO CAPS: 50000.0000 [IU] | ORAL_CAPSULE | ORAL | Status: DC

## 2014-04-03 MED ORDER — VITAMIN D (CHOLECALCIFEROL) 25 MCG (1000 UT) PO TABS: 1.0000 | ORAL_TABLET | Freq: Every day | ORAL | Status: DC

## 2014-04-03 MED ORDER — LEVOTHYROXINE SODIUM 100 MCG PO TABS: 100.0000 ug | ORAL_TABLET | Freq: Every day | ORAL | Status: DC

## 2014-04-03 MED ORDER — LOSARTAN POTASSIUM 100 MG PO TABS: 100.0000 mg | ORAL_TABLET | Freq: Every day | ORAL | Status: DC

## 2014-04-03 MED ORDER — AMLODIPINE BESYLATE 5 MG PO TABS: 5.0000 mg | ORAL_TABLET | Freq: Every day | ORAL | Status: DC

## 2014-04-03 MED ORDER — MOMETASONE FUROATE 50 MCG/ACT NA SUSP: 1.0000 | Freq: Every day | NASAL | Status: DC

## 2014-04-03 NOTE — Progress Notes (Signed)
   Subjective:    Patient ID: Madison Jefferson, female    DOB: August 05, 1951, 63 y.o.   MRN: [id number]  HPI Madison Jefferson is a 63yo woman w/ PMHx of HTN, hypothyroidism s/p ablation, diverticulosis, and asthma who presents to the clinic today for the following:  1. Hypothyroidism: Pt had elevated TSH of 5.651 on 12/12/13 and was scheduled to have repeat labs drawn on 12/16/13 but the labs were never done. Pt denies any weakness, tiredness, changes in weight, changes in appetite, palpitations, constipation, and changes in skin or nails.   2. HTN: BP today 123/72. Pt is on Amlodipine 5 mg QD, HCTZ 25 mg QD, Losartan 100 mg QD, and Metoprolol 100 mg QD at home.   3. Allergies: Pt has a history of seasonal allergies. Pt reports headaches, rhinorrhea, and sinus pressure. She denies fever, chills, and sore throat. She takes Claritin which relieves her symptoms.   Review of Systems General: Denies night sweats HEENT: Denies ear pain, changes in vision CV: Denies CP, orthopnea Pulm: Denies cough, wheezing GI: Denies abdominal pain, nausea, vomiting, diarrhea, melena, hematochezia GU: Denies dysuria, hematuria, frequency Msk: Denies muscle cramps, joint pains Neuro: Denies numbness, tingling Skin: Denies rashes, bruising    Objective:   Physical Exam General: appears stated age, sitting up in chair, NAD HEENT: [place]/AT, EOMI, PERRL, sclera anicteric, pharynx non-erythematous, no tenderness in maxillary and ethmoid sinuses, mucus membranes moist Neck: supple, no JVD, no lymphadenopathy CV: RRR, normal S1/S2, no m/g/r Pulm: CTA bilaterally, breaths non-labored, no wheezing Abd: BS+, soft, non-distended, non-tender Ext: warm, no edema, moves all Neuro: alert and oriented x 3, CNs II-XII intact, strength 5/5 in upper and lower extremities bilaterally     Assessment & Plan:

## 2014-04-03 NOTE — Assessment & Plan Note (Signed)
TSH elevated at visit on 12/12/13.  - Repeat TSH, T4, T3 labs - Continue Levothyroxine 100 mcg daily, adjust as necessary once labs come back

## 2014-04-03 NOTE — Assessment & Plan Note (Signed)
BP Readings from Last 3 Encounters:  04/03/14 123/72  12/12/13 162/77  09/20/13 139/65    Lab Results  Component Value Date   NA 139 12/12/2013   K 3.4* 12/12/2013   CREATININE 0.79 12/12/2013    Assessment: Blood pressure control: controlled Progress toward BP goal:  at goal Comments:   Plan: Medications:  continue current medications Educational resources provided: brochure Self management tools provided:   Other plans: Pt at BP goal, continue current regimen.

## 2014-04-03 NOTE — Patient Instructions (Addendum)
It was a pleasure taking care of you today, [name].  Here is what we discussed:  1. Hypothyroidism: - Continue levothyroxine 100 mcg daily - I will call you with your thyroid lab results   2. High Blood Pressure: - Continue Amlodipine, HCTZ, Losartan, and Metoprolol   General Instructions:   Thank you for bringing your medicines today. This helps [place] keep you safe from mistakes.   Progress Toward Treatment Goals:  Treatment Goal 12/12/2013  Blood pressure at goal    Self Care Goals & Plans:  Self Care Goal 04/03/2014  Manage my medications take my medicines as prescribed; bring my medications to every visit; refill my medications on time  Monitor my health -  Eat healthy foods drink diet soda or water instead of juice or soda; eat more vegetables; eat foods that are low in salt; eat baked foods instead of fried foods; eat fruit for snacks and desserts  Be physically active -  Meeting treatment goals -    No flowsheet data found.   Care Management & Community Referrals:  Referral 12/12/2013  Referrals made for care management support none needed  Referrals made to community resources none

## 2014-04-03 NOTE — Assessment & Plan Note (Signed)
Pt complaining of allergy symptoms including headaches, sinus pressure, and rhinorrhea.  - Continue Claritin

## 2014-04-04 NOTE — Progress Notes (Signed)
I saw and evaluated the patient.  I personally confirmed the key portions of Dr. Shela Commons history and exam and reviewed pertinent patient test results.  The assessment, diagnosis, and plan were formulated together and I agree with the documentation in the resident's note.

## 2014-04-16 ENCOUNTER — Other Ambulatory Visit: Payer: Self-pay | Admitting: Internal Medicine

## 2014-05-25 ENCOUNTER — Other Ambulatory Visit: Payer: Self-pay | Admitting: Internal Medicine

## 2014-06-20 ENCOUNTER — Ambulatory Visit (INDEPENDENT_AMBULATORY_CARE_PROVIDER_SITE_OTHER): Payer: Commercial Managed Care - HMO | Admitting: Internal Medicine

## 2014-06-20 ENCOUNTER — Encounter: Payer: Self-pay | Admitting: Internal Medicine

## 2014-06-20 VITALS — BP 138/46 | HR 56 | Temp 98.1°F | Ht 61.0 in | Wt 175.7 lb

## 2014-06-20 DIAGNOSIS — Z0000 Encounter for general adult medical examination without abnormal findings: Secondary | ICD-10-CM

## 2014-06-20 DIAGNOSIS — J309 Allergic rhinitis, unspecified: Secondary | ICD-10-CM

## 2014-06-20 DIAGNOSIS — Z23 Encounter for immunization: Secondary | ICD-10-CM

## 2014-06-20 DIAGNOSIS — J4520 Mild intermittent asthma, uncomplicated: Secondary | ICD-10-CM

## 2014-06-20 DIAGNOSIS — I10 Essential (primary) hypertension: Secondary | ICD-10-CM

## 2014-06-20 MED ORDER — LOSARTAN POTASSIUM 100 MG PO TABS: 100.0000 mg | ORAL_TABLET | Freq: Every day | ORAL | Status: DC

## 2014-06-20 MED ORDER — ZOSTER VACCINE LIVE 19400 UNT/0.65ML ~~LOC~~ SOLR: 0.6500 mL | Freq: Once | SUBCUTANEOUS | Status: DC

## 2014-06-20 MED ORDER — METOPROLOL SUCCINATE ER 100 MG PO TB24: 100.0000 mg | ORAL_TABLET | Freq: Every day | ORAL | Status: DC

## 2014-06-20 MED ORDER — LORATADINE 10 MG PO TABS: 5.0000 mg | ORAL_TABLET | Freq: Every day | ORAL | Status: DC

## 2014-06-20 MED ORDER — MOMETASONE FUROATE 50 MCG/ACT NA SUSP: 1.0000 | Freq: Every day | NASAL | Status: DC

## 2014-06-20 MED ORDER — ASPIRIN 325 MG PO TABS: ORAL_TABLET | ORAL | Status: DC

## 2014-06-20 NOTE — Progress Notes (Signed)
   Subjective:    Patient ID: Madison Jefferson, female    DOB: Nov 27, 1950, 63 y.o.   MRN: [id number]  HPI Pt seen and examined. Madison Jefferson is a 63 y/o female who presents for routine follow up and medication refill. She states her asthma is under control and she only uses her albuterol once a day if she uses it. No fevers, no cough, no wheezing ] Pt also c/o intermittent L ankle pain when it is cold or raining outside which is relieved with 1 alleve. No swelling, no fevers. Pt states she had an injury years ago and it the occasional pain does not interfere in her daily activities.  No other complaints  Review of Systems  Constitutional: Negative.   HENT: Negative.   Eyes: Negative.   Respiratory: Negative.   Cardiovascular: Negative.   Gastrointestinal: Negative.   Endocrine: Negative.   Genitourinary: Negative.   Musculoskeletal: Positive for arthralgias.       Occasional L ankle pain  Skin: Negative.   Neurological: Negative.   Psychiatric/Behavioral: Negative.        Objective:   Physical Exam  Constitutional: She is oriented to person, place, and time. She appears well-developed and well-nourished.  HENT:  Head: Normocephalic and atraumatic.  Eyes: Conjunctivae are normal.  Neck: Normal range of motion.  Cardiovascular: Normal rate, regular rhythm and normal heart sounds.   No murmur heard. Pulmonary/Chest: Effort normal and breath sounds normal. She has no wheezes. She has no rales.  Abdominal: Soft. Bowel sounds are normal. She exhibits no distension. There is no tenderness. There is no rebound.  Musculoskeletal: Normal range of motion. She exhibits tenderness. She exhibits no edema.  Mild L ankle tenderness near the medial malleolus, no swelling, no increased warmth  Neurological: She is alert and oriented to person, place, and time.  Skin: Skin is warm and dry. She is not diaphoretic.  Psychiatric: She has a normal mood and affect. Her behavior is normal. Thought content  normal.          Assessment & Plan:  Please refer to problem based charting for assessment and plan

## 2014-06-20 NOTE — Assessment & Plan Note (Signed)
-   Flu vaccine given in clinic today - Prescription for zoster vaccine given- Pt to take the vaccine at [facility] - Pt is due for tetanus booster on next visit

## 2014-06-20 NOTE — Assessment & Plan Note (Signed)
-   Refilled loratidine and mometasone nasal spray - Stable on current meds

## 2014-06-20 NOTE — Assessment & Plan Note (Signed)
-   Asthma is stable on current meds - will monitor

## 2014-06-20 NOTE — Assessment & Plan Note (Signed)
BP Readings from Last 3 Encounters:  06/20/14 138/46  04/03/14 123/72  12/12/13 162/77    Lab Results  Component Value Date   NA 139 12/12/2013   K 3.4* 12/12/2013   CREATININE 0.79 12/12/2013    Assessment: Blood pressure control:  well controlled Progress toward BP goal:   at goal Comments: coplaint with meds  Plan: Medications:  continue current medications Educational resources provided: brochure Self management tools provided:   Other plans:

## 2014-06-20 NOTE — Patient Instructions (Signed)
-   It was a pleasure meeting you today - We will give you the flu shot  - I will give you a prescription for the zoster vaccine. Please take it to your pharmacy to get the vaccine - We will attempt to arrange for your colonoscopy - We will give you a tetanus shout on your next visit - Thank you for bringing in your medications today. I will call in refills today for the ones you are running low on

## 2014-07-07 ENCOUNTER — Emergency Department (HOSPITAL_COMMUNITY): Payer: Medicare HMO

## 2014-07-07 ENCOUNTER — Encounter (HOSPITAL_COMMUNITY): Payer: Self-pay | Admitting: Family Medicine

## 2014-07-07 ENCOUNTER — Emergency Department (HOSPITAL_COMMUNITY)
Admission: EM | Admit: 2014-07-07 | Discharge: 2014-07-07 | Disposition: A | Discharge status: Home / Self Care | Payer: Medicare HMO | Attending: Emergency Medicine | Admitting: Emergency Medicine

## 2014-07-07 DIAGNOSIS — I10 Essential (primary) hypertension: Secondary | ICD-10-CM | POA: Insufficient documentation

## 2014-07-07 DIAGNOSIS — M79605 Pain in left leg: Secondary | ICD-10-CM

## 2014-07-07 DIAGNOSIS — M79604 Pain in right leg: Secondary | ICD-10-CM | POA: Diagnosis not present

## 2014-07-07 DIAGNOSIS — Z7982 Long term (current) use of aspirin: Secondary | ICD-10-CM | POA: Insufficient documentation

## 2014-07-07 DIAGNOSIS — E079 Disorder of thyroid, unspecified: Secondary | ICD-10-CM | POA: Insufficient documentation

## 2014-07-07 DIAGNOSIS — Z8719 Personal history of other diseases of the digestive system: Secondary | ICD-10-CM | POA: Diagnosis not present

## 2014-07-07 DIAGNOSIS — Z79899 Other long term (current) drug therapy: Secondary | ICD-10-CM | POA: Insufficient documentation

## 2014-07-07 DIAGNOSIS — Z8673 Personal history of transient ischemic attack (TIA), and cerebral infarction without residual deficits: Secondary | ICD-10-CM | POA: Diagnosis not present

## 2014-07-07 DIAGNOSIS — J45909 Unspecified asthma, uncomplicated: Secondary | ICD-10-CM | POA: Insufficient documentation

## 2014-07-07 DIAGNOSIS — R52 Pain, unspecified: Secondary | ICD-10-CM

## 2014-07-07 DIAGNOSIS — M1990 Unspecified osteoarthritis, unspecified site: Secondary | ICD-10-CM | POA: Insufficient documentation

## 2014-07-07 DIAGNOSIS — Z7952 Long term (current) use of systemic steroids: Secondary | ICD-10-CM | POA: Insufficient documentation

## 2014-07-07 MED ORDER — TRAMADOL HCL 50 MG PO TABS: 50.0000 mg | ORAL_TABLET | Freq: Four times a day (QID) | ORAL | Status: DC | PRN

## 2014-07-07 NOTE — ED Provider Notes (Signed)
CSN: [id number]     Arrival date & time 07/07/14  1111 History   First MD Initiated Contact with Patient 07/07/14 1143     Chief Complaint  Patient presents with  . Leg Pain    HPI Patient presents with complaints of pain in her right leg which she has noticed over the last several days. The pain is primarily behind the right knee and into her lower calf on the right side. Pain is sharp and achy. Movement increases the pain. She has been standing on a hard concrete floor work recently. She denies any fevers or chills or swelling. She denies any chest pain or shortness of breath. She does not recall any recent injuries. Past Medical History  Diagnosis Date  . Hypertension   . Allergy   . Asthma   . GERD (gastroesophageal reflux disease)   . Vitamin D deficiency   . Fatty liver   . Thyroid disease     hypothyroid  . Urinary incontinence     mixed urge and stress   . Lumbar radiculopathy   . Atrial fibrillation     h/o of isolated AF associated with hypothyroidism   . Diverticulosis     cecum  . Arthritis   . TIA (transient ischemic attack)     2006  . Diverticulosis     colonoscopy 2002 [facility]   Past Surgical History  Procedure Laterality Date  . Cholecystectomy    . Other surgical history      carpel tunnel surgery   . Other surgical history      carpel tunnel repair  . Right heart cath  2005-Dr. Doylene Canard    minimal CAD; left circumflex dominant with 10-15% proximal lesion;; normal LVF  . Tubal ligation    . Vaginal hysterectomy      partial hysterectomy 1983 vaginal bleeding  . Carpal tunnel release      left wrist  . Ablation      thyroid with radioactive iodine ablation   Family History  Problem Relation Age of Onset  . Diabetes Mother   . Stroke Mother   . Cancer Mother     mother <50 at diagnosis (breast); h/o oral cancer  . Diabetes Father   . Heart disease Father   . Stroke Father   . Hypertension Father   . Diabetes Sister   . Diabetes Brother    . Colon cancer Brother   . Diabetes Paternal Uncle   . Cancer Brother     lung  . Cancer Brother     throat  . Esophageal cancer Neg Hx   . Rectal cancer Neg Hx   . Stomach cancer Neg Hx    History  Substance Use Topics  . Smoking status: Never Smoker   . Smokeless tobacco: Never Used  . Alcohol Use: No   OB History    No data available     Review of Systems  All other systems reviewed and are negative.     Allergies  Oysters  Home Medications   Prior to Admission medications   Medication Sig Start Date End Date Taking? Authorizing Provider  albuterol (PROVENTIL HFA;VENTOLIN HFA) 108 (90 BASE) MCG/ACT inhaler Inhale 2 puffs into the lungs every 6 (six) hours as needed. 04/03/14  Yes Carly Rivet, MD  amLODipine (NORVASC) 5 MG tablet Take 1 tablet (5 mg total) by mouth daily. 04/03/14 04/03/15 Yes Carly Rivet, MD  aspirin 325 MG tablet TAKE 1 TABLET BY MOUTH DAILY 06/20/14  Yes Aldine Contes, MD  hydrochlorothiazide (HYDRODIURIL) 25 MG tablet TAKE 1 TABLET BY MOUTH DAILY 04/16/14  Yes Nischal Narendra, MD  levothyroxine (SYNTHROID, LEVOTHROID) 100 MCG tablet Take 1 tablet (100 mcg total) by mouth daily. 04/03/14  Yes Carly Rivet, MD  loratadine (CLARITIN) 10 MG tablet Take 0.5 tablets (5 mg total) by mouth daily. 06/20/14  Yes Nischal Dareen Piano, MD  losartan (COZAAR) 100 MG tablet Take 1 tablet (100 mg total) by mouth daily. 06/20/14  Yes Aldine Contes, MD  metoprolol succinate (TOPROL-XL) 100 MG 24 hr tablet Take 1 tablet (100 mg total) by mouth daily. Take with or immediately following a meal. 06/20/14  Yes Nischal Narendra, MD  mometasone (NASONEX) 50 MCG/ACT nasal spray Place 1 spray into the nose daily. 06/20/14  Yes Aldine Contes, MD  Trolamine Salicylate (ASPERCREME EX) Apply 1 application topically daily as needed (pain).   Yes Historical Provider, MD  Vitamin D, Ergocalciferol, (DRISDOL) 50000 UNITS CAPS capsule Take 1 capsule (50,000 Units total) by mouth every 7  (seven) days. Take on tuesday 04/03/14  Yes Carly Rivet, MD  sodium chloride (OCEAN NASAL SPRAY) 0.65 % nasal spray Place 2 sprays into the nose as needed for congestion. Patient not taking: Reported on 07/07/2014 11/17/12 12/12/13  Otho Bellows, MD  traMADol (ULTRAM) 50 MG tablet Take 1 tablet (50 mg total) by mouth every 6 (six) hours as needed. 07/07/14   Dorie Rank, MD  Vitamin D, Cholecalciferol, 1000 UNITS TABS Take 1 tablet by mouth daily. Patient not taking: Reported on 07/07/2014 04/03/14   Albin Felling, MD  zoster vaccine live, PF, (ZOSTAVAX) 29924 UNT/0.65ML injection Inject 19,400 Units into the skin once. 06/20/14   Nischal Narendra, MD   BP 148/61 mmHg  Pulse 57  Temp(Src) 98 F (36.7 C)  Resp 11  Ht 5\' 1"  (1.549 m)  Wt 170 lb (77.111 kg)  BMI 32.14 kg/m2  SpO2 99% Physical Exam  Constitutional: She appears well-developed and well-nourished. No distress.  HENT:  Head: Normocephalic and atraumatic.  Right Ear: External ear normal.  Left Ear: External ear normal.  Eyes: Conjunctivae are normal. Right eye exhibits no discharge. Left eye exhibits no discharge. No scleral icterus.  Neck: Neck supple. No tracheal deviation present.  Cardiovascular: Normal rate.   Pulmonary/Chest: Effort normal. No stridor. No respiratory distress.  Musculoskeletal: She exhibits tenderness. She exhibits no edema.       Right knee: She exhibits no swelling, no effusion and no deformity. Tenderness found.       Right lower leg: She exhibits tenderness. She exhibits no bony tenderness, no swelling, no edema and no deformity.  Tenderness to palpation in the popliteal fossa, tenderness palpation, no edema or swelling, no erythema, normal distal perfusion and pulses  Neurological: She is alert. Cranial nerve deficit: no gross deficits.  Skin: Skin is warm and dry. No rash noted.  Psychiatric: She has a normal mood and affect.  Nursing note and vitals reviewed.   ED Course  Procedures (including  critical care time) Labs Review Labs Reviewed - No data to display  Imaging Review Dg Tibia/fibula Right  07/07/2014   CLINICAL DATA:  Pain lateral tibia/ fibula beginning 5 days ago. No injury.  EXAM: RIGHT TIBIA AND FIBULA - 2 VIEW  COMPARISON:  None.  FINDINGS: Mild osteoarthritic change of the knee. No fracture, dislocation or focal bone abnormality.  IMPRESSION: No acute findings.   Electronically Signed   By: Marin Olp M.D.   On: 07/07/2014 12:55  Dg Knee Complete 4 Views Right  07/07/2014   CLINICAL DATA:  Medial side knee pain, beginning to state. No known injury. Initial encounter.  EXAM: RIGHT KNEE - COMPLETE 4+ VIEW  COMPARISON:  None.  FINDINGS: Early spurring in the medial and patellofemoral compartments. No acute bony abnormality. Specifically, no fracture, subluxation, or dislocation. Soft tissues are intact. No joint effusion.  IMPRESSION: Early degenerative changes.  No acute bony abnormality.   Electronically Signed   By: Rolm Baptise M.D.   On: 07/07/2014 12:57     MDM   Final diagnoses:  Leg pain, inferior, left    No swelling or erythema.  Doubt DVT or infection. ?musculoskeletal.  Follow up ortho.  Rx pain meds.   At this time there does not appear to be any evidence of an acute emergency medical condition and the patient appears stable for discharge with appropriate outpatient follow up.     Dorie Rank, MD 07/07/14 1323

## 2014-07-07 NOTE — ED Notes (Signed)
Patient asked nurse tech if she could take her home BP meds.  Tech asked Dr. Tomi Bamberger.  OK for pt to take home meds.

## 2014-07-07 NOTE — ED Notes (Signed)
Per pt sts right leg pain x a few days. sts calf, shin. Denies injury.

## 2014-07-07 NOTE — Discharge Instructions (Signed)

## 2014-07-08 ENCOUNTER — Telehealth: Payer: Self-pay | Admitting: *Deleted

## 2014-07-08 NOTE — Telephone Encounter (Signed)
Pt seen in ED yesterday for leg pain.  Xrays taken and medication given. Wants to be seen for f/u. No improvement in leg pain.

## 2014-07-09 ENCOUNTER — Encounter: Payer: Self-pay | Admitting: Internal Medicine

## 2014-07-09 ENCOUNTER — Ambulatory Visit (INDEPENDENT_AMBULATORY_CARE_PROVIDER_SITE_OTHER): Payer: Commercial Managed Care - HMO | Admitting: Internal Medicine

## 2014-07-09 VITALS — BP 147/69 | HR 57 | Temp 98.2°F | Wt 172.4 lb

## 2014-07-09 DIAGNOSIS — M79604 Pain in right leg: Secondary | ICD-10-CM

## 2014-07-09 DIAGNOSIS — M25561 Pain in right knee: Secondary | ICD-10-CM | POA: Insufficient documentation

## 2014-07-09 LAB — BASIC METABOLIC PANEL WITH GFR
BUN: 13 mg/dL (ref 6–23)
CO2: 29 mEq/L (ref 19–32)
Calcium: 9 mg/dL (ref 8.4–10.5)
Chloride: 100 mEq/L (ref 96–112)
Creat: 0.86 mg/dL (ref 0.50–1.10)
GFR, EST AFRICAN AMERICAN: 82 mL/min
GFR, Est Non African American: 71 mL/min
GLUCOSE: 109 mg/dL — AB (ref 70–99)
POTASSIUM: 3.6 meq/L (ref 3.5–5.3)
Sodium: 138 mEq/L (ref 135–145)

## 2014-07-09 NOTE — Progress Notes (Addendum)
Patient ID: Madison Jefferson, female   DOB: 07/10/1951, 63 y.o.   MRN: [id number]    Subjective:   [name]    DOB: [birth year] 63 y.o.    MRN: [id number] Health Maintenance Due: Health Maintenance Due  Topic Date Due  . TETANUS/TDAP  10/11/1967  . ZOSTAVAX  10/10/2008  . COLONOSCOPY  09/28/2010    _________________________________________________  HPI: [name] is a 63 y.o. female here for an acute visit.  Pt has a PMH outlined below.  Please see problem-based charting assessment and plan note for further details of medical issues addressed at today's visit.  PMH: Past Medical History  Diagnosis Date  . Hypertension   . Allergy   . Asthma   . GERD (gastroesophageal reflux disease)   . Vitamin D deficiency   . Fatty liver   . Thyroid disease     hypothyroid  . Urinary incontinence     mixed urge and stress   . Lumbar radiculopathy   . Atrial fibrillation     h/o of isolated AF associated with hypothyroidism   . Diverticulosis     cecum  . Arthritis   . TIA (transient ischemic attack)     2006  . Diverticulosis     colonoscopy 2002 [facility]    Medications: Current Outpatient Prescriptions on File Prior to Visit  Medication Sig Dispense Refill  . albuterol (PROVENTIL HFA;VENTOLIN HFA) 108 (90 BASE) MCG/ACT inhaler Inhale 2 puffs into the lungs every 6 (six) hours as needed. 1 Inhaler 2  . amLODipine (NORVASC) 5 MG tablet Take 1 tablet (5 mg total) by mouth daily. 30 tablet 5  . aspirin 325 MG tablet TAKE 1 TABLET BY MOUTH DAILY 30 tablet 0  . hydrochlorothiazide (HYDRODIURIL) 25 MG tablet TAKE 1 TABLET BY MOUTH DAILY 30 tablet 0  . levothyroxine (SYNTHROID, LEVOTHROID) 100 MCG tablet Take 1 tablet (100 mcg total) by mouth daily. 30 tablet 5  . loratadine (CLARITIN) 10 MG tablet Take 0.5 tablets (5 mg total) by mouth daily. 30 tablet 11  . losartan (COZAAR) 100 MG tablet Take 1 tablet (100 mg total) by mouth daily. 30 tablet 5  . metoprolol  succinate (TOPROL-XL) 100 MG 24 hr tablet Take 1 tablet (100 mg total) by mouth daily. Take with or immediately following a meal. 30 tablet 2  . mometasone (NASONEX) 50 MCG/ACT nasal spray Place 1 spray into the nose daily. 17 g 5  . sodium chloride (OCEAN NASAL SPRAY) 0.65 % nasal spray Place 2 sprays into the nose as needed for congestion. (Patient not taking: Reported on 07/07/2014) 45 mL 11  . traMADol (ULTRAM) 50 MG tablet Take 1 tablet (50 mg total) by mouth every 6 (six) hours as needed. 15 tablet 0  . Trolamine Salicylate (ASPERCREME EX) Apply 1 application topically daily as needed (pain).    . Vitamin D, Cholecalciferol, 1000 UNITS TABS Take 1 tablet by mouth daily. (Patient not taking: Reported on 07/07/2014) 90 tablet 1  . zoster vaccine live, PF, (ZOSTAVAX) 03546 UNT/0.65ML injection Inject 19,400 Units into the skin once. 1 each 0   No current facility-administered medications on file prior to visit.    Allergies: Allergies  Allergen Reactions  . Oysters [Shellfish Allergy] Rash    FH: Family History  Problem Relation Age of Onset  . Diabetes Mother   . Stroke Mother   . Cancer Mother     mother <50 at diagnosis (breast); h/o oral cancer  .  Diabetes Father   . Heart disease Father   . Stroke Father   . Hypertension Father   . Diabetes Sister   . Diabetes Brother   . Colon cancer Brother   . Diabetes Paternal Uncle   . Cancer Brother     lung  . Cancer Brother     throat  . Esophageal cancer Neg Hx   . Rectal cancer Neg Hx   . Stomach cancer Neg Hx     SH: History   Social History  . Marital Status: Divorced    Spouse Name: N/A    Number of Children: N/A  . Years of Education: N/A   Social History Main Topics  . Smoking status: Never Smoker   . Smokeless tobacco: Never Used  . Alcohol Use: No  . Drug Use: No  . Sexual Activity: None   Other Topics Concern  . None   Social History Narrative    Review of Systems: Constitutional: Negative for  fever, chills and weight loss.  Eyes: Negative for blurred vision.  Respiratory: Negative for cough and shortness of breath.  Cardiovascular: Negative for chest pain, palpitations and leg swelling.  Gastrointestinal: Negative for nausea, vomiting, abdominal pain, diarrhea, constipation and blood in stool.  Genitourinary: Negative for dysuria, urgency and frequency.  Musculoskeletal: +myalgias and back pain.  Neurological: Negative for dizziness, weakness and headaches.     Objective:   Vital Signs: Filed Vitals:   07/09/14 0832  BP: 147/69  Pulse: 57  Temp: 98.2 F (36.8 C)  TempSrc: Oral  Weight: 172 lb 6.4 oz (78.2 kg)  SpO2: 100%      BP Readings from Last 3 Encounters:  07/09/14 147/69  07/07/14 173/61  06/20/14 138/46    Physical Exam: Constitutional: Vital signs reviewed.  Patient is well-developed and well-nourished in NAD and cooperative with exam.  Head: Normocephalic and atraumatic. Eyes: PERRL, EOMI, conjunctivae nl, no scleral icterus.  Neck: Supple. Cardiovascular: RRR, no MRG. Pulmonary/Chest: normal effort, non-tender to palpation, CTAB, no wheezes, rales, or rhonchi. Abdominal: Soft. NT/ND +BS. Musculoskeletal: No swelling, erythema, or warmth of the right LE compared with the left LE.  Decreased ROM of the right knee due to pain.   Neurological: A&O x3, cranial nerves II-XII are grossly intact, moving all extremities. Extremities: 2+DP b/l; no pitting edema. Skin: Warm, dry and intact. No rash.   Assessment & Plan:   Assessment and plan was discussed and formulated with my attending.

## 2014-07-09 NOTE — Assessment & Plan Note (Addendum)
Pt p/w right leg pain since last Tuesday.  She states the pain began gradually and describes it has a achy pain and is having difficulty straightening her leg.  The pain is mainly located behind the knee.  She was in the ED with the same pain and XR were completed which showed OA of the right knee but no fracture.  She was given tramadol in the ED which she says has helped somewhat.  She denies any swelling, erythema, or warmth.  No recent long distance travel, no SOB, CP, fever/chills, h/o malignancy, or smoking history.  No h/o DVT or blood clots.  Wells Score is negligible so no concern for DVT.  On exam, there is no swelling, erythema, or warmth of the right leg compared with the left.  No swelling on the back of the knee so doubt Bakers cyst.  She reports working on a concrete floor at [employer] and recently changed shoes.  I suspect that this may be MSK versus OA.  Also has a h/o borderline hypokalemia which could also be contributing.   -advised to try heating pad on a low setting and warm baths -will check potassium today  -continue tramadol as needed; may also use aleve sparingly  -return to clinic in 1 week if no improvement

## 2014-07-09 NOTE — Addendum Note (Signed)
Addended by: Jones Bales on: 07/09/2014 05:10 PM   Modules accepted: Level of Service

## 2014-07-09 NOTE — Patient Instructions (Addendum)
Thank you for your visit today.   Please return to the internal medicine clinic to follow up with your primary physician, Dr. Dareen Piano if your symptoms do not improve.      Your current medical regimen is effective;  continue present plan and take all medications as prescribed.    I will check your electrolytes to see if this may be causing her leg pain.  You may continue to use tramadol and aleve as instructed.   You may also try some epsom salt baths and a heating pad on a low setting.   Please be sure to bring all of your medications with you to every visit; this includes herbal supplements, vitamins, eye drops, and any over-the-counter medications.   Should you have any questions regarding your medications and/or any new or worsening symptoms, please be sure to call the clinic at [phone].   If you believe that you are suffering from a life threatening condition or one that may result in the loss of limb or function, then you should call 911 or proceed to the nearest Emergency Department.     A healthy lifestyle and preventative care can promote health and wellness.   Maintain regular health, dental, and eye exams.  Eat a healthy diet. Foods like vegetables, fruits, whole grains, low-fat dairy products, and lean protein foods contain the nutrients you need without too many calories. Decrease your intake of foods high in solid fats, added sugars, and salt. Get information about a proper diet from your caregiver, if necessary.  Regular physical exercise is one of the most important things you can do for your health. Most adults should get at least 150 minutes of moderate-intensity exercise (any activity that increases your heart rate and causes you to sweat) each week. In addition, most adults need muscle-strengthening exercises on 2 or more days a week.   Maintain a healthy weight. The body mass index (BMI) is a screening tool to identify possible weight problems. It provides an  estimate of body fat based on height and weight. Your caregiver can help determine your BMI, and can help you achieve or maintain a healthy weight. For adults 20 years and older:  A BMI below 18.5 is considered underweight.  A BMI of 18.5 to 24.9 is normal.  A BMI of 25 to 29.9 is considered overweight.  A BMI of 30 and above is considered obese.

## 2014-07-10 NOTE — Progress Notes (Signed)
Internal Medicine Clinic Attending  Case discussed with Dr. Gill soon after the resident saw the patient.  We reviewed the resident's history and exam and pertinent patient test results.  I agree with the assessment, diagnosis, and plan of care documented in the resident's note.  

## 2014-07-25 ENCOUNTER — Encounter (HOSPITAL_COMMUNITY): Payer: Self-pay | Admitting: Cardiovascular Disease

## 2014-11-16 ENCOUNTER — Other Ambulatory Visit: Payer: Self-pay | Admitting: Internal Medicine

## 2014-11-18 ENCOUNTER — Other Ambulatory Visit: Payer: Self-pay | Admitting: Internal Medicine

## 2014-11-23 ENCOUNTER — Other Ambulatory Visit: Payer: Self-pay | Admitting: Internal Medicine

## 2014-12-16 ENCOUNTER — Other Ambulatory Visit: Payer: Self-pay | Admitting: Internal Medicine

## 2014-12-22 ENCOUNTER — Other Ambulatory Visit: Payer: Self-pay | Admitting: Internal Medicine

## 2014-12-23 ENCOUNTER — Other Ambulatory Visit: Payer: Self-pay | Admitting: Internal Medicine

## 2014-12-24 ENCOUNTER — Other Ambulatory Visit: Payer: Self-pay | Admitting: Internal Medicine

## 2015-02-16 ENCOUNTER — Other Ambulatory Visit: Payer: Self-pay | Admitting: Internal Medicine

## 2015-03-18 ENCOUNTER — Ambulatory Visit (INDEPENDENT_AMBULATORY_CARE_PROVIDER_SITE_OTHER): Payer: Commercial Managed Care - HMO | Admitting: Internal Medicine

## 2015-03-18 ENCOUNTER — Encounter: Payer: Self-pay | Admitting: Internal Medicine

## 2015-03-18 VITALS — BP 146/60 | HR 64 | Ht 61.0 in | Wt 179.6 lb

## 2015-03-18 DIAGNOSIS — Z23 Encounter for immunization: Secondary | ICD-10-CM | POA: Diagnosis not present

## 2015-03-18 DIAGNOSIS — I10 Essential (primary) hypertension: Secondary | ICD-10-CM

## 2015-03-18 DIAGNOSIS — E559 Vitamin D deficiency, unspecified: Secondary | ICD-10-CM | POA: Insufficient documentation

## 2015-03-18 DIAGNOSIS — M25561 Pain in right knee: Secondary | ICD-10-CM

## 2015-03-18 DIAGNOSIS — Z0000 Encounter for general adult medical examination without abnormal findings: Secondary | ICD-10-CM

## 2015-03-18 DIAGNOSIS — J309 Allergic rhinitis, unspecified: Secondary | ICD-10-CM

## 2015-03-18 DIAGNOSIS — E039 Hypothyroidism, unspecified: Secondary | ICD-10-CM

## 2015-03-18 DIAGNOSIS — E890 Postprocedural hypothyroidism: Secondary | ICD-10-CM | POA: Diagnosis not present

## 2015-03-18 DIAGNOSIS — Z13820 Encounter for screening for osteoporosis: Secondary | ICD-10-CM

## 2015-03-18 DIAGNOSIS — M79604 Pain in right leg: Secondary | ICD-10-CM

## 2015-03-18 MED ORDER — AMLODIPINE BESYLATE 5 MG PO TABS: 5.0000 mg | ORAL_TABLET | Freq: Every day | ORAL | Status: DC

## 2015-03-18 MED ORDER — LORATADINE 10 MG PO TABS: 10.0000 mg | ORAL_TABLET | Freq: Every day | ORAL | Status: DC

## 2015-03-18 MED ORDER — MOMETASONE FUROATE 50 MCG/ACT NA SUSP: 1.0000 | Freq: Every day | NASAL | Status: DC

## 2015-03-18 MED ORDER — DICLOFENAC SODIUM 1 % TD GEL: 2.0000 g | Freq: Four times a day (QID) | TRANSDERMAL | Status: DC | PRN

## 2015-03-18 NOTE — Assessment & Plan Note (Signed)
-   Will give prevnar today - Will give tetanus booster on next visit - She states she took zoster vaccine at [facility] - We will order DEXA scan for osteoporosis screening

## 2015-03-18 NOTE — Assessment & Plan Note (Signed)
-   Patient complains of mild nasal congestion and sneezing due to her allergies - Will refill mometasone nasal spray - Loratidine dose increased to 10 mg - Will reassess on next visit

## 2015-03-18 NOTE — Assessment & Plan Note (Signed)
-   Will recheck vitamin D levels today - c/w current dose till results are back - May consider decreasing dose if wnl

## 2015-03-18 NOTE — Assessment & Plan Note (Signed)
-   Patient still with intermittent R knee pain. No fevers. Able to ambulate without difficulty and pain is usually relieved with tylenol or ibuprofen - X ray right knee with degenerative changes - Will start voltaren gel prn R knee pain for suspected OA - Will reassess in 6 months. If pain is worsening may need referral to sports medicine

## 2015-03-18 NOTE — Patient Instructions (Signed)
-   It was a pleasure seeing you today - I have refilled your blood pressure and allergy medications - I have prescribed voltaren gel for your knee pain. Please avoid ibuprofen while using this medication - I have decreased your aspirin dose to 81 mg - We will give you your pneumonia vaccine today - We will check your blood work today - I have ordered a bone scan for you to check for osteoporosis - Please follow up in 6 months

## 2015-03-18 NOTE — Assessment & Plan Note (Signed)
BP Readings from Last 3 Encounters:  03/18/15 146/60  07/09/14 147/69  07/07/14 173/61    Lab Results  Component Value Date   NA 138 07/09/2014   K 3.6 07/09/2014   CREATININE 0.86 07/09/2014    Assessment: Blood pressure control:   fair Progress toward BP goal:    Comments: Patient with mildly elevated BP and is compliant with her meds  Plan: Medications:  continue current medications Educational resources provided: brochure (denies) Self management tools provided: home blood pressure logbook Other plans: Will reassess in 6 months

## 2015-03-18 NOTE — Assessment & Plan Note (Signed)
-   Continue with synthroid - Will recheck TSH today - Patient denies fatigue/weight gain/constipation or other signs of hypothyroidism

## 2015-03-18 NOTE — Progress Notes (Signed)
   Subjective:    Patient ID: Madison Jefferson, female    DOB: 09/10/1950, 64 y.o.   MRN: [id number]  HPI Patient seen and examined. She is here for routine follow up of her HTN and R knee arthritis.  She also complains of mild R ear pain, no discharge, no fevers. States she wears ear plugs at work and thinks that this may have caused the pain. No difficulty hearing. Please see problem based charting for more details  Review of Systems  Constitutional: Negative.   HENT: Positive for ear pain and sneezing. Negative for ear discharge, facial swelling, hearing loss, mouth sores, postnasal drip, rhinorrhea and sinus pressure.   Eyes: Negative.   Respiratory: Negative.   Cardiovascular: Negative.   Gastrointestinal: Negative.   Genitourinary: Negative.   Musculoskeletal: Positive for arthralgias. Negative for back pain, joint swelling, gait problem, neck pain and neck stiffness.  Skin: Negative.   Neurological: Negative.   Psychiatric/Behavioral: Negative.        Objective:   Physical Exam  Constitutional: She is oriented to person, place, and time. She appears well-developed and well-nourished. No distress.  HENT:  Head: Normocephalic and atraumatic.  Right Ear: External ear normal.  Left Ear: External ear normal.  TM intact bilaterally, no erythema/discharge  Eyes: Conjunctivae are normal.  Neck: Normal range of motion. Neck supple.  Cardiovascular: Normal rate, regular rhythm and normal heart sounds.   Pulmonary/Chest: Effort normal and breath sounds normal. No respiratory distress. She has no wheezes.  Abdominal: Soft. Bowel sounds are normal. She exhibits no distension. There is no tenderness.  Musculoskeletal: Normal range of motion. She exhibits no tenderness.  Mild R knee swelling but no tenderness/erythema/increased local warmth  Neurological: She is alert and oriented to person, place, and time. No cranial nerve deficit.  Skin: Skin is warm. No rash noted. No erythema.    Psychiatric: She has a normal mood and affect. Her behavior is normal.          Assessment & Plan:  Please see problem based charting for assessment and plan:

## 2015-03-19 LAB — BASIC METABOLIC PANEL
BUN / CREAT RATIO: 19 (ref 11–26)
BUN: 13 mg/dL (ref 8–27)
CO2: 26 mmol/L (ref 18–29)
Calcium: 9.1 mg/dL (ref 8.7–10.3)
Chloride: 100 mmol/L (ref 97–108)
Creatinine, Ser: 0.68 mg/dL (ref 0.57–1.00)
GFR calc Af Amer: 105 mL/min/{1.73_m2} (ref >59)
GFR calc non Af Amer: 91 mL/min/{1.73_m2} (ref >59)
Glucose: 110 mg/dL — ABNORMAL HIGH (ref 65–99)
Potassium: 3.7 mmol/L (ref 3.5–5.2)
Sodium: 143 mmol/L (ref 134–144)

## 2015-03-19 LAB — TSH: TSH: 1.25 u[IU]/mL (ref 0.450–4.500)

## 2015-03-19 LAB — LIPID PANEL
CHOL/HDL RATIO: 3.3 ratio (ref 0.0–4.4)
Cholesterol, Total: 165 mg/dL (ref 100–199)
HDL: 50 mg/dL (ref >39)
LDL Calculated: 88 mg/dL (ref 0–99)
TRIGLYCERIDES: 135 mg/dL (ref 0–149)
VLDL Cholesterol Cal: 27 mg/dL (ref 5–40)

## 2015-03-19 LAB — VITAMIN D 25 HYDROXY (VIT D DEFICIENCY, FRACTURES): Vit D, 25-Hydroxy: 22.2 ng/mL — ABNORMAL LOW (ref 30.0–100.0)

## 2015-03-20 ENCOUNTER — Other Ambulatory Visit: Payer: Self-pay | Admitting: Internal Medicine

## 2015-04-20 ENCOUNTER — Other Ambulatory Visit: Payer: Self-pay | Admitting: Internal Medicine

## 2015-04-21 ENCOUNTER — Other Ambulatory Visit: Payer: Self-pay | Admitting: Internal Medicine

## 2015-04-22 ENCOUNTER — Other Ambulatory Visit: Payer: Self-pay | Admitting: Internal Medicine

## 2015-04-22 MED ORDER — ASPIRIN EC 81 MG PO TBEC: 81.0000 mg | DELAYED_RELEASE_TABLET | Freq: Every day | ORAL | Status: DC

## 2015-05-23 ENCOUNTER — Other Ambulatory Visit: Payer: Self-pay | Admitting: Internal Medicine

## 2015-07-01 ENCOUNTER — Encounter: Payer: Self-pay | Admitting: Student

## 2015-07-19 ENCOUNTER — Other Ambulatory Visit: Payer: Self-pay | Admitting: Internal Medicine

## 2015-07-21 ENCOUNTER — Other Ambulatory Visit: Payer: Self-pay | Admitting: Internal Medicine

## 2015-08-11 ENCOUNTER — Emergency Department (HOSPITAL_COMMUNITY)
Admission: EM | Admit: 2015-08-11 | Discharge: 2015-08-11 | Disposition: A | Discharge status: Home / Self Care | Payer: Commercial Managed Care - HMO | Attending: Emergency Medicine | Admitting: Emergency Medicine

## 2015-08-11 ENCOUNTER — Encounter (HOSPITAL_COMMUNITY): Payer: Self-pay | Admitting: Emergency Medicine

## 2015-08-11 ENCOUNTER — Emergency Department (HOSPITAL_COMMUNITY): Payer: Commercial Managed Care - HMO

## 2015-08-11 DIAGNOSIS — Z9889 Other specified postprocedural states: Secondary | ICD-10-CM | POA: Insufficient documentation

## 2015-08-11 DIAGNOSIS — Z7982 Long term (current) use of aspirin: Secondary | ICD-10-CM | POA: Insufficient documentation

## 2015-08-11 DIAGNOSIS — I10 Essential (primary) hypertension: Secondary | ICD-10-CM | POA: Insufficient documentation

## 2015-08-11 DIAGNOSIS — Z8673 Personal history of transient ischemic attack (TIA), and cerebral infarction without residual deficits: Secondary | ICD-10-CM | POA: Insufficient documentation

## 2015-08-11 DIAGNOSIS — J111 Influenza due to unidentified influenza virus with other respiratory manifestations: Secondary | ICD-10-CM | POA: Insufficient documentation

## 2015-08-11 DIAGNOSIS — M1990 Unspecified osteoarthritis, unspecified site: Secondary | ICD-10-CM | POA: Diagnosis not present

## 2015-08-11 DIAGNOSIS — Z79899 Other long term (current) drug therapy: Secondary | ICD-10-CM | POA: Insufficient documentation

## 2015-08-11 DIAGNOSIS — Z8719 Personal history of other diseases of the digestive system: Secondary | ICD-10-CM | POA: Diagnosis not present

## 2015-08-11 DIAGNOSIS — I4891 Unspecified atrial fibrillation: Secondary | ICD-10-CM | POA: Insufficient documentation

## 2015-08-11 DIAGNOSIS — R079 Chest pain, unspecified: Secondary | ICD-10-CM | POA: Diagnosis not present

## 2015-08-11 DIAGNOSIS — J45901 Unspecified asthma with (acute) exacerbation: Secondary | ICD-10-CM | POA: Insufficient documentation

## 2015-08-11 DIAGNOSIS — E559 Vitamin D deficiency, unspecified: Secondary | ICD-10-CM | POA: Insufficient documentation

## 2015-08-11 DIAGNOSIS — Z8739 Personal history of other diseases of the musculoskeletal system and connective tissue: Secondary | ICD-10-CM | POA: Diagnosis not present

## 2015-08-11 DIAGNOSIS — R05 Cough: Secondary | ICD-10-CM | POA: Diagnosis present

## 2015-08-11 DIAGNOSIS — E039 Hypothyroidism, unspecified: Secondary | ICD-10-CM | POA: Insufficient documentation

## 2015-08-11 DIAGNOSIS — R69 Illness, unspecified: Secondary | ICD-10-CM

## 2015-08-11 MED ORDER — ACETAMINOPHEN 500 MG PO TABS
1000.0000 mg | ORAL_TABLET | Freq: Once | ORAL | Status: AC
  Administered 2015-08-11: 1000 mg via ORAL
  Filled 2015-08-11: qty 2

## 2015-08-11 NOTE — ED Provider Notes (Signed)
CSN: [id number]     Arrival date & time 08/11/15  0935 History   First MD Initiated Contact with Patient 08/11/15 1009     Chief Complaint  Patient presents with  . Cough  . Generalized Body Aches     (Consider location/radiation/quality/duration/timing/severity/associated sxs/prior Treatment) HPI Complains of cough fever diffuse myalgias and sore throat onset 24 hours ago denies shortness of breath admits to "wheezing" treated with Robitussin last night with improvement of cough and treated with Tylenol last night. No other associated symptoms. No nausea or vomiting. Admits to anterior chest pain worse with cough. Past Medical History  Diagnosis Date  . Hypertension   . Allergy   . Asthma   . GERD (gastroesophageal reflux disease)   . Vitamin D deficiency   . Fatty liver   . Thyroid disease     hypothyroid  . Urinary incontinence     mixed urge and stress   . Lumbar radiculopathy   . Atrial fibrillation ([place])     h/o of isolated AF associated with hypothyroidism   . Diverticulosis     cecum  . Arthritis   . TIA (transient ischemic attack)     2006  . Diverticulosis     colonoscopy 2002 [facility]   Past Surgical History  Procedure Laterality Date  . Cholecystectomy    . Other surgical history      carpel tunnel surgery   . Other surgical history      carpel tunnel repair  . Right heart cath  2005-Dr. Doylene Canard    minimal CAD; left circumflex dominant with 10-15% proximal lesion;; normal LVF  . Tubal ligation    . Vaginal hysterectomy      partial hysterectomy 1983 vaginal bleeding  . Carpal tunnel release      left wrist  . Ablation      thyroid with radioactive iodine ablation  . Left heart catheterization with coronary angiogram N/A 01/28/2012    Procedure: LEFT HEART CATHETERIZATION WITH CORONARY ANGIOGRAM;  Surgeon: Birdie Riddle, MD;  Location: [place] [facility];  Service: Cardiovascular;  Laterality: N/A;   Family History  Problem Relation Age of Onset  .  Diabetes Mother   . Stroke Mother   . Cancer Mother     mother <50 at diagnosis (breast); h/o oral cancer  . Diabetes Father   . Heart disease Father   . Stroke Father   . Hypertension Father   . Diabetes Sister   . Diabetes Brother   . Colon cancer Brother   . Diabetes Paternal Uncle   . Cancer Brother     lung  . Cancer Brother     throat  . Esophageal cancer Neg Hx   . Rectal cancer Neg Hx   . Stomach cancer Neg Hx    Social History  Substance Use Topics  . Smoking status: Never Smoker   . Smokeless tobacco: Never Used  . Alcohol Use: No   OB History    No data available     Review of Systems  Constitutional: Negative.   HENT: Positive for sore throat.   Respiratory: Positive for cough, shortness of breath and wheezing.   Cardiovascular: Negative.   Gastrointestinal: Negative.   Musculoskeletal: Positive for myalgias.       Diffuse myalgias  Skin: Negative.   Neurological: Negative.   Psychiatric/Behavioral: Negative.   All other systems reviewed and are negative.     Allergies  Oysters  Home Medications   Prior to  Admission medications   Medication Sig Start Date End Date Taking? Authorizing Provider  albuterol (PROVENTIL HFA;VENTOLIN HFA) 108 (90 BASE) MCG/ACT inhaler Inhale 2 puffs into the lungs every 6 (six) hours as needed. 04/03/14   Carly Montey Hora, MD  amLODipine (NORVASC) 5 MG tablet Take 1 tablet (5 mg total) by mouth daily. 03/18/15   Aldine Contes, MD  aspirin EC 81 MG tablet Take 1 tablet (81 mg total) by mouth daily. 04/22/15 04/21/16  Aldine Contes, MD  diclofenac sodium (VOLTAREN) 1 % GEL Apply 2 g topically 4 (four) times daily as needed. 03/18/15   Nischal Dareen Piano, MD  hydrochlorothiazide (HYDRODIURIL) 25 MG tablet TAKE 1 TABLET BY MOUTH DAILY 05/26/15   Nischal Narendra, MD  levothyroxine (SYNTHROID, LEVOTHROID) 100 MCG tablet TAKE 1 TABLET BY MOUTH DAILY 07/21/15   Aldine Contes, MD  loratadine (CLARITIN) 10 MG tablet Take 1 tablet (10  mg total) by mouth daily. 03/18/15   Nischal Dareen Piano, MD  losartan (COZAAR) 100 MG tablet TAKE 1 TABLET BY MOUTH DAILY 04/22/15   Aldine Contes, MD  metoprolol succinate (TOPROL-XL) 100 MG 24 hr tablet TAKE 1 TABLET BY MOUTH EVERY DAY WITH OR IMMEDIATELY FOLLOWING A MEAL 07/22/15   Nischal Narendra, MD  mometasone (NASONEX) 50 MCG/ACT nasal spray Place 1 spray into the nose daily. 03/18/15   Aldine Contes, MD  sodium chloride (OCEAN NASAL SPRAY) 0.65 % nasal spray Place 2 sprays into the nose as needed for congestion. Patient not taking: Reported on 07/07/2014 11/17/12 12/12/13  Otho Bellows, MD  traMADol (ULTRAM) 50 MG tablet Take 1 tablet (50 mg total) by mouth every 6 (six) hours as needed. 07/07/14   Dorie Rank, MD  Trolamine Salicylate (ASPERCREME EX) Apply 1 application topically daily as needed (pain).    Historical Provider, MD  Vitamin D, Cholecalciferol, 1000 UNITS TABS Take 1 tablet by mouth daily. Patient not taking: Reported on 07/07/2014 04/03/14   Sindy Guadeloupe Rivet, MD  Vitamin D, Ergocalciferol, (DRISDOL) 50000 UNITS CAPS capsule TAKE ONE CAPSULE BY MOUTH EVERY 7 DAYS ON TUESDAYS 12/23/14   Nischal Narendra, MD  zoster vaccine live, PF, (ZOSTAVAX) 16109 UNT/0.65ML injection Inject 19,400 Units into the skin once. 06/20/14   Nischal Narendra, MD   BP 193/83 mmHg  Pulse 102  Temp(Src) 102.5 F (39.2 C) (Oral)  Resp 22  Ht 5\' 1"  (1.549 m)  Wt 170 lb (77.111 kg)  BMI 32.14 kg/m2  SpO2 97% Physical Exam  Constitutional: She appears well-developed and well-nourished. No distress.  HENT:  Head: Normocephalic and atraumatic.  Eyes: Conjunctivae are normal. Pupils are equal, round, and reactive to light.  Neck: Neck supple. No tracheal deviation present. No thyromegaly present.  Cardiovascular: Normal rate and regular rhythm.   No murmur heard. Pulmonary/Chest: Breath sounds normal. No respiratory distress.  Coughing  Abdominal: Soft. Bowel sounds are normal. She exhibits no distension.  There is no tenderness.  Musculoskeletal: Normal range of motion. She exhibits no edema or tenderness.  Neurological: She is alert. Coordination normal.  Skin: Skin is warm and dry. No rash noted.  Psychiatric: She has a normal mood and affect.  Nursing note and vitals reviewed.   ED Course  Procedures (including critical care time) Labs Review Labs Reviewed  URINE CULTURE  COMPREHENSIVE METABOLIC PANEL  URINALYSIS, ROUTINE W REFLEX MICROSCOPIC (NOT AT [facility])  I-STAT CG4 LACTIC ACID, ED    Imaging Review No results found. I have personally reviewed and evaluated these images and lab results as part  of my medical decision-making.   EKG Interpretation None     12 noon patient feels improved after treatment with Tylenol Chest x-ray viewed by me Results for orders placed or performed in visit on 03/18/15  TSH  Result Value Ref Range   TSH 1.250 0.450 - 4.500 uIU/mL  Basic Metabolic Panel w/GFR  Result Value Ref Range   Glucose 110 (H) 65 - 99 mg/dL   BUN 13 8 - 27 mg/dL   Creatinine, Ser 0.68 0.57 - 1.00 mg/dL   GFR calc non Af Amer 91 >59 mL/min/1.73   GFR calc Af Amer 105 >59 mL/min/1.73   BUN/Creatinine Ratio 19 11 - 26   Sodium 143 134 - 144 mmol/L   Potassium 3.7 3.5 - 5.2 mmol/L   Chloride 100 97 - 108 mmol/L   CO2 26 18 - 29 mmol/L   Calcium 9.1 8.7 - 10.3 mg/dL  Vitamin D (25 hydroxy)  Result Value Ref Range   Vit D, 25-Hydroxy 22.2 (L) 30.0 - 100.0 ng/mL  Lipid Profile  Result Value Ref Range   Cholesterol, Total 165 100 - 199 mg/dL   Triglycerides 135 0 - 149 mg/dL   HDL 50 >39 mg/dL   VLDL Cholesterol Cal 27 5 - 40 mg/dL   LDL Calculated 88 0 - 99 mg/dL   Chol/HDL Ratio 3.3 0.0 - 4.4 ratio units   Dg Chest 2 View  08/11/2015  CLINICAL DATA:  Cough EXAM: CHEST  2 VIEW COMPARISON:  None. FINDINGS: Cardiomediastinal silhouette is stable. No acute infiltrate or pleural effusion. No pulmonary edema. Mild degenerative changes mid and lower thoracic spine.  IMPRESSION: No active disease.  Mild degenerative changes thoracic spine Electronically Signed   By: Lahoma Crocker M.D.   On: 08/11/2015 11:00    MDM  No evidence of pneumonia .Plan Tylenol for fever or aches she can use her albuterol inhaler 2 puffs every 4 hours as needed for cough or shortness of breath Final diagnoses:  None   follow up at [facility] outpatient clinic if not better by next week Diagnosis influenza-like illness      Orlie Dakin, MD 08/11/15 1216

## 2015-08-11 NOTE — ED Notes (Signed)
Patient transported to X-ray 

## 2015-08-11 NOTE — ED Notes (Signed)
Onset one day ago woke up with productive cough clear white sputum and general body achy continued today.

## 2015-08-11 NOTE — ED Notes (Signed)
EDP at bedside  

## 2015-08-11 NOTE — ED Notes (Signed)
Pt ambulated to the bathroom with ease 

## 2015-08-11 NOTE — Discharge Instructions (Signed)
Take Tylenol every 4 hours for temperature higher than 100.4 or for aches. Make sure that you drink lots of fluids. Drink at least six 8 ounce glasses of water daily. Use your albuterol inhaler 2 puffs every 4 hours as needed for cough or shortness of breath. Contact the [facility] outpatient clinic to be seen if not feeling better by next week. Return if your condition worsens for any reason.

## 2015-08-18 ENCOUNTER — Other Ambulatory Visit: Payer: Self-pay | Admitting: Internal Medicine

## 2015-08-27 ENCOUNTER — Other Ambulatory Visit: Payer: Self-pay | Admitting: Internal Medicine

## 2015-09-14 ENCOUNTER — Other Ambulatory Visit: Payer: Self-pay | Admitting: Internal Medicine

## 2015-10-06 ENCOUNTER — Telehealth: Payer: Self-pay | Admitting: Internal Medicine

## 2015-10-06 NOTE — Telephone Encounter (Signed)
APPT. REMINDER CALL, PHONE IS OFF

## 2015-10-07 ENCOUNTER — Encounter: Payer: Self-pay | Admitting: Internal Medicine

## 2015-10-07 ENCOUNTER — Ambulatory Visit: Payer: Medicare HMO | Admitting: Internal Medicine

## 2015-10-13 ENCOUNTER — Other Ambulatory Visit: Payer: Self-pay | Admitting: Internal Medicine

## 2015-10-28 ENCOUNTER — Other Ambulatory Visit: Payer: Self-pay | Admitting: Internal Medicine

## 2015-11-27 ENCOUNTER — Other Ambulatory Visit: Payer: Self-pay | Admitting: Internal Medicine

## 2015-12-04 ENCOUNTER — Other Ambulatory Visit: Payer: Self-pay | Admitting: Internal Medicine

## 2015-12-04 NOTE — Telephone Encounter (Signed)
This medication no loner active as of 07/2015.  Patient not seen in clinic since 03/2015.  Have tried to call patient to schedule but no answer and no way to leave VM.  Will mail letter for her to contact us.

## 2015-12-05 ENCOUNTER — Telehealth: Payer: Self-pay | Admitting: Internal Medicine

## 2015-12-05 NOTE — Telephone Encounter (Signed)
APPT. REMINDER CALL NO ANSWER, NO VOICEMAIL

## 2015-12-08 ENCOUNTER — Ambulatory Visit: Payer: Medicare HMO | Admitting: Internal Medicine

## 2015-12-09 ENCOUNTER — Telehealth: Payer: Self-pay | Admitting: Internal Medicine

## 2015-12-09 NOTE — Telephone Encounter (Signed)
APPT. REMINDER CALL, NO ANSWER, NO VOICE MAIL °

## 2015-12-10 ENCOUNTER — Encounter: Payer: Self-pay | Admitting: Pulmonary Disease

## 2015-12-10 ENCOUNTER — Ambulatory Visit (INDEPENDENT_AMBULATORY_CARE_PROVIDER_SITE_OTHER): Payer: Commercial Managed Care - HMO | Admitting: Pulmonary Disease

## 2015-12-10 VITALS — BP 156/68 | HR 77 | Temp 97.8°F | Ht 61.0 in | Wt 183.3 lb

## 2015-12-10 DIAGNOSIS — I10 Essential (primary) hypertension: Secondary | ICD-10-CM

## 2015-12-10 DIAGNOSIS — Z23 Encounter for immunization: Secondary | ICD-10-CM | POA: Diagnosis not present

## 2015-12-10 DIAGNOSIS — Z1231 Encounter for screening mammogram for malignant neoplasm of breast: Secondary | ICD-10-CM

## 2015-12-10 DIAGNOSIS — J301 Allergic rhinitis due to pollen: Secondary | ICD-10-CM

## 2015-12-10 DIAGNOSIS — E039 Hypothyroidism, unspecified: Secondary | ICD-10-CM

## 2015-12-10 DIAGNOSIS — E559 Vitamin D deficiency, unspecified: Secondary | ICD-10-CM

## 2015-12-10 DIAGNOSIS — Z0000 Encounter for general adult medical examination without abnormal findings: Secondary | ICD-10-CM

## 2015-12-10 MED ORDER — METOPROLOL SUCCINATE ER 100 MG PO TB24: 100.0000 mg | ORAL_TABLET | Freq: Every day | ORAL | Status: DC

## 2015-12-10 MED ORDER — LOSARTAN POTASSIUM 100 MG PO TABS: 100.0000 mg | ORAL_TABLET | Freq: Every day | ORAL | Status: DC

## 2015-12-10 MED ORDER — LEVOTHYROXINE SODIUM 100 MCG PO TABS: 100.0000 ug | ORAL_TABLET | Freq: Every day | ORAL | Status: DC

## 2015-12-10 MED ORDER — MOMETASONE FUROATE 50 MCG/ACT NA SUSP: NASAL | Status: DC

## 2015-12-10 MED ORDER — ALBUTEROL SULFATE HFA 108 (90 BASE) MCG/ACT IN AERS: 1.0000 | INHALATION_SPRAY | Freq: Four times a day (QID) | RESPIRATORY_TRACT | Status: DC | PRN

## 2015-12-10 MED ORDER — TETANUS-DIPHTH-ACELL PERTUSSIS 5-2.5-18.5 LF-MCG/0.5 IM SUSP: 0.5000 mL | Freq: Once | INTRAMUSCULAR | Status: DC

## 2015-12-10 NOTE — Assessment & Plan Note (Signed)
No signs or symptoms of hypothyroidism today.  Recheck TSH Continue Synthroid

## 2015-12-10 NOTE — Progress Notes (Signed)
Case discussed with Dr. Krall soon after the resident saw the patient.  We reviewed the resident's history and exam and pertinent patient test results.  I agree with the assessment, diagnosis, and plan of care documented in the resident's note. 

## 2015-12-10 NOTE — Progress Notes (Signed)
Subjective:    Patient ID: Madison Jefferson, female    DOB: [birth year], 65 y.o.   MRN: [id number]  HPI Madison Jefferson is a 65 year old woman with history of HTN, asthma, GERD, vitamin D deficiency, hypothyroidism presenting for follow up of HTN.  Allergic rhinitis: Doing okay right now as long as she stays away from trees.   R knee OA: Bothers her more when the weather changes. Also exacerbated by walking on concrete floors. Has socks that help. Takes Aleve and Tylenol when the pain gets worse. Likes Astrocreme better than Voltaren.   Asthma: Uses her albuterol about 1-2 days per week. Depends on the environment she is in.  HTN: ran out of metoprolol succinate 100mg  last week.   Review of Systems Constitutional: no fevers/chills Ears, nose, mouth, throat, and face: no cough Respiratory: no shortness of breath Cardiovascular: no chest pain Gastrointestinal: no nausea/vomiting, no abdominal pain, no constipation, no diarrhea Genitourinary: no dysuria  Past Medical History  Diagnosis Date  . Hypertension   . Allergy   . Asthma   . GERD (gastroesophageal reflux disease)   . Vitamin D deficiency   . Fatty liver   . Thyroid disease     hypothyroid  . Urinary incontinence     mixed urge and stress   . Lumbar radiculopathy   . Atrial fibrillation ([place])     h/o of isolated AF associated with hypothyroidism   . Diverticulosis     cecum  . Arthritis   . TIA (transient ischemic attack)     2006  . Diverticulosis     colonoscopy 2002 [facility]    Current Outpatient Prescriptions on File Prior to Visit  Medication Sig Dispense Refill  . amLODipine (NORVASC) 5 MG tablet TAKE 1 TABLET(5 MG) BY MOUTH DAILY 30 tablet 3  . aspirin EC 81 MG tablet Take 1 tablet (81 mg total) by mouth daily. 150 tablet 2  . hydrochlorothiazide (HYDRODIURIL) 25 MG tablet TAKE 1 TABLET BY MOUTH DAILY 30 tablet 3  . levothyroxine (SYNTHROID, LEVOTHROID) 100 MCG tablet TAKE 1 TABLET BY MOUTH DAILY 30  tablet 3  . loratadine (CLARITIN) 10 MG tablet Take 1 tablet (10 mg total) by mouth daily. 30 tablet 11  . losartan (COZAAR) 100 MG tablet TAKE 1 TABLET BY MOUTH DAILY 30 tablet 6  . mometasone (NASONEX) 50 MCG/ACT nasal spray INSTILL 1 SPRAY IN EACH NOSTRIL DAILY 17 g 0  . VENTOLIN HFA 108 (90 Base) MCG/ACT inhaler INHALE 2 PUFFS BY MOUTH EVERY 6 HOURS AS NEEDED 18 g 0  . Vitamin D, Ergocalciferol, (DRISDOL) 50000 UNITS CAPS capsule TAKE ONE CAPSULE BY MOUTH EVERY 7 DAYS ON TUESDAYS 4 capsule 2   No current facility-administered medications on file prior to visit.      Objective:   Physical Exam Blood pressure 156/68, pulse 77, temperature 97.8 F (36.6 C), temperature source Oral, height 5\' 1"  (1.549 m), weight 183 lb 4.8 oz (83.144 kg), SpO2 100 %.  General Apperance: NAD HEENT: Normocephalic, atraumatic, anicteric sclera Neck: Supple, trachea midline Lungs: Clear to auscultation bilaterally. No wheezes, rhonchi or rales. Breathing comfortably Heart: Regular rate and rhythm, no murmur/rub/gallop Abdomen: Soft, nontender, nondistended, no rebound/guarding Extremities: Warm and well perfused, no edema Skin: No rashes or lesions Neurologic: Alert and interactive. No gross deficits.    Assessment & Plan:  Please refer to problem based charting.  Orders Placed This Encounter  Procedures  . MM Digital Screening    Standing  Status: Future     Number of Occurrences:      Standing Expiration Date: 02/08/2017    Order Specific Question:  Reason for Exam (SYMPTOM  OR DIAGNOSIS REQUIRED)    Answer:  screening    Order Specific Question:  Preferred imaging location?    Answer:  [facility]  . Tdap vaccine greater than or equal to 7yo IM  . TSH  . Vitamin D (25 hydroxy)   Meds ordered this encounter  Medications  . albuterol (VENTOLIN HFA) 108 (90 Base) MCG/ACT inhaler    Sig: Inhale 1-2 puffs into the lungs every 6 (six) hours as needed for wheezing or shortness of breath.     Dispense:  18 g    Refill:  3  . metoprolol succinate (TOPROL-XL) 100 MG 24 hr tablet    Sig: Take 1 tablet (100 mg total) by mouth daily. Take with or immediately following a meal.    Dispense:  30 tablet    Refill:  1  . losartan (COZAAR) 100 MG tablet    Sig: Take 1 tablet (100 mg total) by mouth daily.    Dispense:  30 tablet    Refill:  6  . levothyroxine (SYNTHROID, LEVOTHROID) 100 MCG tablet    Sig: Take 1 tablet (100 mcg total) by mouth daily.    Dispense:  30 tablet    Refill:  3  . mometasone (NASONEX) 50 MCG/ACT nasal spray    Sig: INSTILL 1 SPRAY IN EACH NOSTRIL DAILY    Dispense:  17 g    Refill:  0  . DISCONTD: Tdap (BOOSTRIX) injection 0.5 mL    Sig:

## 2015-12-10 NOTE — Assessment & Plan Note (Addendum)
Assessment: BP 156/68 but she has been out of her metoprolol succinate for the past week. This was on her medication list of her AVS at last clinic visit.  Plan: Continue amlodipine 5mg  daily, HCTZ 25mg  daily, losartan 100mg  daily. Restart metoprolol succinate 100mg  daily. Follow up in 3-6 months

## 2015-12-10 NOTE — Patient Instructions (Addendum)
We will schedule you for a mammogram and bone density screening. Keep taking your medications as prescribed Follow up in 6 months, or sooner as needed

## 2015-12-10 NOTE — Assessment & Plan Note (Signed)
Doing well and symptoms controlled at this time.

## 2015-12-10 NOTE — Assessment & Plan Note (Addendum)
Recheck vitamin D level.  ADDENDUM: Vitamin D lvl 25.3. Start Vitamin D3 800u daily.

## 2015-12-10 NOTE — Assessment & Plan Note (Signed)
TDAP administered Reminded patient to obtain DEXA Mammogram ordered

## 2015-12-11 LAB — TSH: TSH: 3.05 u[IU]/mL (ref 0.450–4.500)

## 2015-12-11 LAB — VITAMIN D 25 HYDROXY (VIT D DEFICIENCY, FRACTURES): Vit D, 25-Hydroxy: 25.3 ng/mL — ABNORMAL LOW (ref 30.0–100.0)

## 2015-12-11 MED ORDER — VITAMIN D3 10 MCG (400 UNIT) PO CAPS: 800.0000 [IU] | ORAL_CAPSULE | Freq: Every day | ORAL | Status: DC

## 2015-12-11 NOTE — Addendum Note (Signed)
Addended by: Jacques Earthly T on: 12/11/2015 10:44 AM   Modules accepted: Orders, Medications, SmartSet

## 2016-01-15 ENCOUNTER — Other Ambulatory Visit: Payer: Self-pay | Admitting: Internal Medicine

## 2016-02-23 ENCOUNTER — Other Ambulatory Visit: Payer: Self-pay | Admitting: Pulmonary Disease

## 2016-02-27 ENCOUNTER — Other Ambulatory Visit: Payer: Self-pay | Admitting: Internal Medicine

## 2016-04-05 ENCOUNTER — Other Ambulatory Visit: Payer: Self-pay | Admitting: Internal Medicine

## 2016-04-17 ENCOUNTER — Other Ambulatory Visit: Payer: Self-pay | Admitting: Pulmonary Disease

## 2016-04-21 ENCOUNTER — Telehealth: Payer: Self-pay | Admitting: Internal Medicine

## 2016-04-21 NOTE — Telephone Encounter (Signed)
APT. REMINDER CALL, PHONE OFF °

## 2016-04-22 ENCOUNTER — Encounter: Payer: Self-pay | Admitting: Internal Medicine

## 2016-04-22 ENCOUNTER — Ambulatory Visit (INDEPENDENT_AMBULATORY_CARE_PROVIDER_SITE_OTHER): Payer: Commercial Managed Care - HMO | Admitting: Internal Medicine

## 2016-04-22 VITALS — BP 141/53 | HR 57 | Temp 98.4°F | Ht 61.0 in | Wt 183.2 lb

## 2016-04-22 DIAGNOSIS — M654 Radial styloid tenosynovitis [de Quervain]: Secondary | ICD-10-CM | POA: Insufficient documentation

## 2016-04-22 DIAGNOSIS — M79645 Pain in left finger(s): Secondary | ICD-10-CM | POA: Diagnosis not present

## 2016-04-22 DIAGNOSIS — M25512 Pain in left shoulder: Secondary | ICD-10-CM | POA: Diagnosis not present

## 2016-04-22 MED ORDER — DICLOFENAC SODIUM 1 % TD GEL: 2.0000 g | Freq: Four times a day (QID) | TRANSDERMAL | 1 refills | Status: DC

## 2016-04-22 NOTE — Assessment & Plan Note (Signed)
Assessment Finkelstein test was positive, as such, De Quervains tenosynovitis is the likely cause of her left thumb pain. In addition, patient's work required her to have repetitive movements of her thumb joint. Her shoulder pain is likely not related to the thumb pain, she did use her shoulder repetitively at work which puts her at risk for osteoarthritis. Rotator cuff tendinitis less likely as patient has full range of motion of her left shoulder joint.  Plan -Voltaren gel -RICE -Follow-up visit in one month -Consider corticosteroid injection of left thumb tendon if patient continues to have pain at next visit -Consider ordering imaging of left shoulder joint if patient continues to have pain at follow-up visit

## 2016-04-22 NOTE — Patient Instructions (Signed)
De Quervain Tenosynovitis °Tendons attach muscles to bones. They also help with joint movements. When tendons become irritated or swollen, it is called tendinitis. °The extensor pollicis brevis (EPB) tendon connects the EPB muscle to a bone that is near the base of the thumb. The EPB muscle helps to straighten and extend the thumb. De Quervain tenosynovitis is a condition in which the EPB tendon lining (sheath) becomes irritated, thickened, and swollen. This condition is sometimes called stenosing tenosynovitis. This condition causes pain on the thumb side of the back of the wrist. °CAUSES °Causes of this condition include: °· Activities that repeatedly cause your thumb and wrist to extend. °· A sudden increase in activity or change in activity that affects your wrist. °RISK FACTORS: °This condition is more likely to develop in: °· Females. °· People who have diabetes. °· Women who have recently given birth. °· People who are over 40 years of age. °· People who do activities that involve repeated hand and wrist motions, such as tennis, racquetball, volleyball, gardening, and taking care of children. °· People who do heavy labor. °· People who have poor wrist strength and flexibility. °· People who do not warm up properly before activities. °SYMPTOMS °Symptoms of this condition include: °· Pain or tenderness over the thumb side of the back of the wrist when your thumb and wrist are not moving. °· Pain that gets worse when you straighten your thumb or extend your thumb or wrist. °· Pain when the injured area is touched. °· Locking or catching of the thumb joint while you bend and straighten your thumb. °· Decreased thumb motion due to pain. °· Swelling over the affected area. °DIAGNOSIS °This condition is diagnosed with a medical history and physical exam. Your health care provider will ask for details about your injury and ask about your symptoms. °TREATMENT °Treatment may include the use of icing and medicines to  reduce pain and swelling. You may also be advised to wear a splint or brace to limit your thumb and wrist motion. In less severe cases, treatment may also include working with a physical therapist to strengthen your wrist and calm the irritation around your EPB tendon sheath. In severe cases, surgery may be needed. °HOME CARE INSTRUCTIONS °If You Have a Splint or Brace: °· Wear it as told by your health care provider. Remove it only as told by your health care provider. °· Loosen the splint or brace if your fingers become numb and tingle, or if they turn cold and blue. °· Keep the splint or brace clean and dry. °Managing Pain, Stiffness, and Swelling  °· If directed, apply ice to the injured area. °¨ Put ice in a plastic bag. °¨ Place a towel between your skin and the bag. °¨ Leave the ice on for 20 minutes, 2-3 times per day. °· Move your fingers often to avoid stiffness and to lessen swelling. °· Raise (elevate) the injured area above the level of your heart while you are sitting or lying down. °General Instructions °· Return to your normal activities as told by your health care provider. Ask your health care provider what activities are safe for you. °· Take over-the-counter and prescription medicines only as told by your health care provider. °· Keep all follow-up visits as told by your health care provider. This is important. °· Do not drive or operate heavy machinery while taking prescription pain medicine. °SEEK MEDICAL CARE IF: °· Your pain, tenderness, or swelling gets worse, even if you have had   treatment. °· You have numbness or tingling in your wrist, hand, or fingers on the injured side. °  °This information is not intended to replace advice given to you by your health care provider. Make sure you discuss any questions you have with your health care provider. °  °Document Released: 08/02/2005 Document Revised: 04/23/2015 Document Reviewed: 10/08/2014 °Elsevier Interactive Patient Education ©2016  Elsevier Inc. ° °

## 2016-04-22 NOTE — Progress Notes (Signed)
   CC: Pain in the left thumb and left shoulder  HPI:  [name] is a 65 y.o. female with a past medical history of conditions listed below presenting to the clinic complaining of pain in her left thumb that radiates up her arm to her left shoulder. 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 States about a year ago she hit her left thumb against a machine but did not seek medical attention at that time. States the pain resolved but then came back one month ago. She describes the pain as sharp in nature and worse when she is sleeping. Denies having any associated fevers or chills. States she has no trouble grabbing onto objects and is able to move her shoulder, however, shoulder joint feels stiff sometimes. She has tried over-the-counter Tylenol and Aspercreme and they seem to help.  Past Medical History:  Diagnosis Date  . Allergy   . Arthritis   . Asthma   . Atrial fibrillation ([place])    h/o of isolated AF associated with hypothyroidism   . Diverticulosis    cecum  . Diverticulosis    colonoscopy 2002 [facility]  . Fatty liver   . GERD (gastroesophageal reflux disease)   . Hypertension   . Lumbar radiculopathy   . Thyroid disease    hypothyroid  . TIA (transient ischemic attack)    2006  . Urinary incontinence    mixed urge and stress   . Vitamin D deficiency     Review of Systems:  Pertinent positives mentioned in history of present illness. Remainder of all review of systems negative.  Physical Exam:  Vitals:   04/22/16 0844  BP: (!) 141/53  Pulse: (!) 57  Temp: 98.4 F (36.9 C)  TempSrc: Oral  Weight: 183 lb 3.2 oz (83.1 kg)   Physical Exam  Constitutional: She is oriented to person, place, and time. She appears well-developed and well-nourished. No distress.  HENT:  Head: Normocephalic and atraumatic.  Eyes: EOM are normal.  Neck: Neck supple. No tracheal deviation present.    Cardiovascular: Normal rate, regular rhythm and intact distal pulses.   Pulmonary/Chest: Effort normal and breath sounds normal. No respiratory distress. She has no wheezes. She has no rales.  Abdominal: Soft. Bowel sounds are normal. She exhibits no distension. There is no tenderness. There is no guarding.  Musculoskeletal:  Left shoulder: Full range of motion of shoulder joint both actively and passively. Patient complained of mild pain when shoulder was abducted beyond 90. Left hand: Grip strength 5/5 and full range of motion of thumb. Finkelstein test positive.   Neurological: She is alert and oriented to person, place, and time.  Skin: Skin is warm and dry.    Assessment & Plan:   See Encounters Tab for problem based charting.  Patient discussed with Dr. Daryll Drown

## 2016-04-26 NOTE — Progress Notes (Signed)
[facility] Attending  Case discussed with Dr. Rathoreat the time of the visit. We reviewed the resident's history and exam and pertinent patient test results. I agree with the assessment, diagnosis, and plan of care documented in the resident's note.  

## 2016-05-02 ENCOUNTER — Other Ambulatory Visit: Payer: Self-pay | Admitting: Internal Medicine

## 2016-05-24 ENCOUNTER — Telehealth: Payer: Self-pay | Admitting: Internal Medicine

## 2016-05-24 NOTE — Telephone Encounter (Signed)
APT. REMINDER CALL, PHONE NOT IN SERVICE °

## 2016-05-25 ENCOUNTER — Other Ambulatory Visit: Payer: Self-pay | Admitting: Internal Medicine

## 2016-05-25 ENCOUNTER — Ambulatory Visit (INDEPENDENT_AMBULATORY_CARE_PROVIDER_SITE_OTHER): Payer: Commercial Managed Care - HMO | Admitting: Internal Medicine

## 2016-05-25 ENCOUNTER — Encounter: Payer: Self-pay | Admitting: Internal Medicine

## 2016-05-25 VITALS — BP 144/60 | HR 63 | Temp 98.1°F | Ht 61.0 in | Wt 182.7 lb

## 2016-05-25 DIAGNOSIS — Z0000 Encounter for general adult medical examination without abnormal findings: Secondary | ICD-10-CM

## 2016-05-25 DIAGNOSIS — Z1239 Encounter for other screening for malignant neoplasm of breast: Secondary | ICD-10-CM

## 2016-05-25 DIAGNOSIS — M654 Radial styloid tenosynovitis [de Quervain]: Secondary | ICD-10-CM

## 2016-05-25 DIAGNOSIS — E039 Hypothyroidism, unspecified: Secondary | ICD-10-CM

## 2016-05-25 DIAGNOSIS — E2839 Other primary ovarian failure: Secondary | ICD-10-CM

## 2016-05-25 DIAGNOSIS — E559 Vitamin D deficiency, unspecified: Secondary | ICD-10-CM | POA: Diagnosis not present

## 2016-05-25 DIAGNOSIS — I10 Essential (primary) hypertension: Secondary | ICD-10-CM

## 2016-05-25 DIAGNOSIS — Z1231 Encounter for screening mammogram for malignant neoplasm of breast: Secondary | ICD-10-CM

## 2016-05-25 DIAGNOSIS — Z79899 Other long term (current) drug therapy: Secondary | ICD-10-CM

## 2016-05-25 MED ORDER — MOMETASONE FUROATE 50 MCG/ACT NA SUSP: NASAL | 3 refills | Status: DC

## 2016-05-25 MED ORDER — LORATADINE 10 MG PO TABS: 10.0000 mg | ORAL_TABLET | Freq: Every day | ORAL | 11 refills | Status: DC

## 2016-05-25 MED ORDER — ALBUTEROL SULFATE HFA 108 (90 BASE) MCG/ACT IN AERS: 1.0000 | INHALATION_SPRAY | Freq: Four times a day (QID) | RESPIRATORY_TRACT | 3 refills | Status: DC | PRN

## 2016-05-25 MED ORDER — LEVOTHYROXINE SODIUM 100 MCG PO TABS: ORAL_TABLET | ORAL | 3 refills | Status: DC

## 2016-05-25 MED ORDER — METOPROLOL SUCCINATE ER 100 MG PO TB24: ORAL_TABLET | ORAL | 3 refills | Status: DC

## 2016-05-25 NOTE — Assessment & Plan Note (Signed)
-   Patient noted to have positive Wynn Maudlin test - Will c/w voltaren gel for now - Will refer to sports medicine for follow up given persistent pain

## 2016-05-25 NOTE — Assessment & Plan Note (Addendum)
BP Readings from Last 3 Encounters:  05/25/16 (!) 144/60  04/22/16 (!) 141/53  12/10/15 (!) 156/68    Lab Results  Component Value Date   NA 143 03/18/2015   K 3.7 03/18/2015   CREATININE 0.68 03/18/2015    Assessment: Blood pressure control:  fair Progress toward BP goal:   at goal Comments: will continue with cozaar, HCTZ, norvasc and metoprolol  Plan: Medications:  continue current medications Educational resources provided: brochure (denies) Self management tools provided:   Other plans: Will check BMP, lipid profile

## 2016-05-25 NOTE — Assessment & Plan Note (Signed)
-   She got her flu shot at work - Scheduled her DEXA and mammogram for November

## 2016-05-25 NOTE — Assessment & Plan Note (Signed)
-   Will check Vit D level today - Patient states she has not taken vitamin D over the last couple of weeks - Will consider refilling her Vitamin D if her levels are still low

## 2016-05-25 NOTE — Progress Notes (Signed)
   Subjective:    Patient ID: Madison Jefferson, female    DOB: 08-29-1950, 65 y.o.   MRN: [id number]  HPI  I have seen and examined the patient. She is here for routine follow up of her HTN and De Quervain's tenosynovitis. Complains of mild pain in her left thumb on moving it a certain way. She states pain radiates up her arm. Only mildly improved since last visit. She also states she got her flu shot at work last week.  She denies any other complaints at this time    Review of Systems  Constitutional: Negative.   HENT: Negative.   Respiratory: Negative.   Cardiovascular: Negative.   Gastrointestinal: Negative.   Musculoskeletal:       Left thumb pain  Neurological: Negative.   Psychiatric/Behavioral: Negative.        Objective:   Physical Exam  Constitutional: She is oriented to person, place, and time. She appears well-developed and well-nourished.  HENT:  Head: Normocephalic and atraumatic.  Mouth/Throat: No oropharyngeal exudate.  Neck: Normal range of motion. Neck supple.  Cardiovascular: Normal rate, regular rhythm and normal heart sounds.   Pulmonary/Chest: Effort normal and breath sounds normal. No respiratory distress. She has no wheezes.  Abdominal: Soft. Bowel sounds are normal. She exhibits no distension. There is no tenderness.  Musculoskeletal: Normal range of motion. She exhibits no edema.  Lymphadenopathy:    She has no cervical adenopathy.  Neurological: She is alert and oriented to person, place, and time.  Skin: Skin is warm. No rash noted. No erythema.  Psychiatric: She has a normal mood and affect. Her behavior is normal.          Assessment & Plan:   Please see problem based charting for assessment and plan:

## 2016-05-25 NOTE — Patient Instructions (Signed)
- It was a pleasure seeing you today - We will send you for some bloodwork today - We will schedule you for a bone scan and mammogram - Please continue to watch your diet and exercise - Please follow up in 3 months for a BP recheck      Exercising to Lose Weight Exercising can help you to lose weight. In order to lose weight through exercise, you need to do vigorous-intensity exercise. You can tell that you are exercising with vigorous intensity if you are breathing very hard and fast and cannot hold a conversation while exercising. Moderate-intensity exercise helps to maintain your current weight. You can tell that you are exercising at a moderate level if you have a higher heart rate and faster breathing, but you are still able to hold a conversation. HOW OFTEN SHOULD I EXERCISE? Choose an activity that you enjoy and set realistic goals. Your health care provider can help you to make an activity plan that works for you. Exercise regularly as directed by your health care provider. This may include:  Doing resistance training twice each week, such as:  Push-ups.  Sit-ups.  Lifting weights.  Using resistance bands.  Doing a given intensity of exercise for a given amount of time. Choose from these options:  150 minutes of moderate-intensity exercise every week.  75 minutes of vigorous-intensity exercise every week.  A mix of moderate-intensity and vigorous-intensity exercise every week. Children, pregnant women, people who are out of shape, people who are overweight, and older adults may need to consult a health care provider for individual recommendations. If you have any sort of medical condition, be sure to consult your health care provider before starting a new exercise program. WHAT ARE SOME ACTIVITIES THAT CAN HELP ME TO LOSE WEIGHT?   Walking at a rate of at least 4.5 miles an hour.  Jogging or running at a rate of 5 miles per hour.  Biking at a rate of at least 10 miles  per hour.  Lap swimming.  Roller-skating or in-line skating.  Cross-country skiing.  Vigorous competitive sports, such as football, basketball, and soccer.  Jumping rope.  Aerobic dancing. HOW CAN I BE MORE ACTIVE IN MY DAY-TO-DAY ACTIVITIES?  Use the stairs instead of the elevator.  Take a walk during your lunch break.  If you drive, park your car farther away from work or school.  If you take public transportation, get off one stop early and walk the rest of the way.  Make all of your phone calls while standing up and walking around.  Get up, stretch, and walk around every 30 minutes throughout the day. WHAT GUIDELINES SHOULD I FOLLOW WHILE EXERCISING?  Do not exercise so much that you hurt yourself, feel dizzy, or get very short of breath.  Consult your health care provider prior to starting a new exercise program.  Wear comfortable clothes and shoes with good support.  Drink plenty of water while you exercise to prevent dehydration or heat stroke. Body water is lost during exercise and must be replaced.  Work out until you breathe faster and your heart beats faster.   This information is not intended to replace advice given to you by your health care provider. Make sure you discuss any questions you have with your health care provider.   Document Released: 09/04/2010 Document Revised: 08/23/2014 Document Reviewed: 01/03/2014 Elsevier Interactive Patient Education 2016 Elsevier Inc. ine for your thumb pain - Please follow up in 3 months for a  BP recheck - Please try to exercise and watch your diet

## 2016-05-25 NOTE — Assessment & Plan Note (Signed)
-   Feels well. No symptoms of worsening hypothyroidism - Will check TSH today and continue with current dose of synthroid - 100 mcg

## 2016-05-26 LAB — BMP8+ANION GAP
ANION GAP: 14 mmol/L (ref 10.0–18.0)
BUN/Creatinine Ratio: 15 (ref 12–28)
BUN: 15 mg/dL (ref 8–27)
CALCIUM: 9.3 mg/dL (ref 8.7–10.3)
CO2: 28 mmol/L (ref 18–29)
CREATININE: 1 mg/dL (ref 0.57–1.00)
Chloride: 100 mmol/L (ref 96–106)
GFR calc Af Amer: 67 mL/min/{1.73_m2} (ref >59)
GFR calc non Af Amer: 58 mL/min/{1.73_m2} — ABNORMAL LOW (ref >59)
Glucose: 110 mg/dL — ABNORMAL HIGH (ref 65–99)
Potassium: 3.4 mmol/L — ABNORMAL LOW (ref 3.5–5.2)
Sodium: 142 mmol/L (ref 134–144)

## 2016-05-26 LAB — LIPID PANEL
CHOLESTEROL TOTAL: 169 mg/dL (ref 100–199)
Chol/HDL Ratio: 3.4 ratio units (ref 0.0–4.4)
HDL: 49 mg/dL (ref >39)
LDL CALC: 85 mg/dL (ref 0–99)
TRIGLYCERIDES: 176 mg/dL — AB (ref 0–149)
VLDL CHOLESTEROL CAL: 35 mg/dL (ref 5–40)

## 2016-05-26 LAB — VITAMIN D 25 HYDROXY (VIT D DEFICIENCY, FRACTURES): Vit D, 25-Hydroxy: 19.9 ng/mL — ABNORMAL LOW (ref 30.0–100.0)

## 2016-05-26 LAB — TSH: TSH: 1.76 u[IU]/mL (ref 0.450–4.500)

## 2016-05-27 ENCOUNTER — Telehealth: Payer: Self-pay | Admitting: Internal Medicine

## 2016-05-27 MED ORDER — VITAMIN D3 10 MCG (400 UNIT) PO CAPS: 800.0000 [IU] | ORAL_CAPSULE | Freq: Every day | ORAL | 1 refills | Status: DC

## 2016-05-27 NOTE — Telephone Encounter (Signed)
Attempted to call patient to discuss lab results. Unable to leave voicemail. Will attempt to call again later

## 2016-05-27 NOTE — Telephone Encounter (Signed)
Called patient to discuss blood work results. Potassium level is borderline low likely secondary to her diuretic. Will recheck BMP on follow up. Patient also with low vitamin D. Patient will resume vitamin D supplementation. Refilled Vitamin D. Patient expresses understanding and agrees with plan

## 2016-05-28 ENCOUNTER — Ambulatory Visit (INDEPENDENT_AMBULATORY_CARE_PROVIDER_SITE_OTHER): Payer: Commercial Managed Care - HMO | Admitting: Family Medicine

## 2016-05-28 ENCOUNTER — Encounter: Payer: Self-pay | Admitting: Family Medicine

## 2016-05-28 VITALS — BP 158/69 | Ht 61.0 in | Wt 181.0 lb

## 2016-05-28 DIAGNOSIS — M1812 Unilateral primary osteoarthritis of first carpometacarpal joint, left hand: Secondary | ICD-10-CM | POA: Diagnosis not present

## 2016-05-28 DIAGNOSIS — M25512 Pain in left shoulder: Secondary | ICD-10-CM | POA: Diagnosis not present

## 2016-05-28 MED ORDER — METHYLPREDNISOLONE ACETATE 40 MG/ML IJ SUSP
40.0000 mg | Freq: Once | INTRAMUSCULAR | Status: AC
  Administered 2016-05-28: 40 mg via INTRA_ARTICULAR

## 2016-05-28 NOTE — Progress Notes (Signed)
[place] Attending Note: I have seen and examined this patient. I have discussed this patient with the medical srudent and reviewed the assessment and plan as documented above. I agree with the student's findings and plan. 1. Left thumb CMP degenerative joint disease. She is right-hand dominant but she does a lot of weaving using large looms . We did corticosteroid injection today. We'll see her back in 2-3 weeks to see how that is helping her next line  #2. Rotator cuff syndrome most likely subacromial bursitis although there may be some component of biceps tendinitis. Since we were also focused on her thumb today we did not ultrasound her shoulder. At return office visit we'll consider ultrasound plus minus injection therapy and home exercise program.

## 2016-05-28 NOTE — Progress Notes (Signed)
   Subjective:  Patient ID: Madison Jefferson, female    DOB: September 15, 1950  Age: 65 y.o. MRN: [id number]  CC:  Left Wrist Pain  HPI Madison Jefferson is a 65 year old woman with a PMH significant for carpal tunnel, who presents today with left wrist pain. 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 About a year ago, patient reports having hit her hand against one of the machines. Patient reports that the pain resolved but started back up in August. Patient reports that the pain starts in her the left radial side of the wrist and radiates all the way to her neck. Patient describes the pain as sharp pain with occasional tingling and numbness. Patient reports that the pain feels like her previous carpal tunnel. Patient reports that the pain is aggravated when she abducts her arms and when she sleeps at night. Patient says that she has to put a pillow between her neck and the bed to prevent the pain as she sleeps on her left side. Patient says that her thumb has undergone some swelling over the duration of the pain, but does not feel like it is swollen today. Patient has tried Asper cream, Aleve, and Tylenol and reports that they have helped with the pain when she takes them. Patient is right hand dominant. Patient denies fever or sudden weight loss.   ROS Review of Systems  Musculoskeletal:       Left Wrist Pain  Refer to HPI for pertinent negatives  Objective:  BP (!) 158/69   Ht 5\' 1"  (1.549 m)   Wt 181 lb (82.1 kg)   BMI 34.20 kg/m   Physical Exam  Constitutional: She appears well-developed and well-nourished.  Musculoskeletal:  Right Wrist: No lesions, deformities, or swelling. No tenderness. Normal ROM. Normal strength. Neurovascularly intact.  Left Wrist: Surgical scar on volar side of wrist. No tenderness or swelling. Normal ROM. Normal strength. Negative Finkelstein's test. Neurovascularly intact.  Right Arm: No lesions, deformities, or swelling. No tenderness. Normal ROM. Normal  strength. Negative empty can test. Neurovascularly intact.  Left Arm: No lesions, deformities, or swelling. No tenderness. Normal ROM but pain with abduction above 90 degrees. Normal strength. Negative empty can test but pain with resistance. Positive Hawkin's test. Neurovascularly intact.    Ultrasound Bone spurs present in CMP joint  Assessment & Plan:   1.Thumb pain secondary to osteoarthritis of first CMP joint Patient has osteoarthritis of the first CMP joint. Informed patients of the options to relieve her thumb pain. Patient elected to get a glucocorticoid injection in her thumb and follow up to reassess.   Meds ordered this encounter  Medications  . methylPREDNISolone acetate (DEPO-MEDROL) injection 40 mg   2. Shoulder pain secondary to possible subacromial bursitis Based on findings, we think it is most likely subacromial bursitis. We informed patient that we could give her a glucocorticoid injection in both her thumb and her shoulder. Patient elected to only get injection in thumb and follow-up for shoulder. Will follow-up with patient in two weeks. At the next visit we will reassess the shoulder at next visit and consider glucocorticoid injection and exercises for next visit.    Follow-up: 2 weeks  Salvadore Dom, Medical Student

## 2016-06-01 DIAGNOSIS — M1812 Unilateral primary osteoarthritis of first carpometacarpal joint, left hand: Secondary | ICD-10-CM

## 2016-06-01 DIAGNOSIS — M25512 Pain in left shoulder: Secondary | ICD-10-CM | POA: Insufficient documentation

## 2016-06-01 HISTORY — DX: Unilateral primary osteoarthritis of first carpometacarpal joint, left hand: M18.12

## 2016-06-11 ENCOUNTER — Ambulatory Visit (INDEPENDENT_AMBULATORY_CARE_PROVIDER_SITE_OTHER): Payer: Commercial Managed Care - HMO | Admitting: Family Medicine

## 2016-06-11 ENCOUNTER — Encounter: Payer: Self-pay | Admitting: Family Medicine

## 2016-06-11 DIAGNOSIS — M25512 Pain in left shoulder: Secondary | ICD-10-CM | POA: Diagnosis not present

## 2016-06-11 DIAGNOSIS — M19012 Primary osteoarthritis, left shoulder: Secondary | ICD-10-CM

## 2016-06-11 MED ORDER — METHYLPREDNISOLONE ACETATE 40 MG/ML IJ SUSP
40.0000 mg | Freq: Once | INTRAMUSCULAR | Status: AC
  Administered 2016-06-11: 40 mg via INTRA_ARTICULAR

## 2016-06-11 NOTE — Progress Notes (Signed)
  Madison Jefferson - 65 y.o. female MRN [id number]  Date of birth: [birth year]  CC: Left shoulder pain and L thumb CMP arthritis f/u  SUBJECTIVE:   She reports approximately 50% improvement in her thumb pain s/p injection 2 weeks ago. Today, she returns for evaluation of her L shoulder pain. She reports pain when raising hands above her head and putting on her coat. Pain sometimes radiates up to neck and down upper arm. No swelling or popping of the shoulder. No weakness or numbness. She had a CSI in the past with good relief, but it has been many years.   ROS: Per HPI. No erythema or nighttime awakenings. Denies numbness, tingling, redness, swelling, fevers, chills.  HISTORY:  Degenerative arthritis of left thumb Obesity  PHYSICAL EXAM:  VS: BP:(!) 163/59  HR:64bpm HT:5\' 1"  (154.9 cm)   WT:181 lb (82.1 kg)  BMI:34.3   Pleasant, well-developed and in no acute distress  L shoulder: No swelling or erythema. Non-tender to palpation. Full ROM but painful with abduction above 90 degrees. Positive Hawkins. Negative empty can but painful. Painful external and internal rotation but full strength. Negative lift off.  Neurovascularly intact distally R shoulder: Full ROM without pain. Negative Hawkins, negative empty can. Neurovascularly intact distally.  Imaging: [place] image of the L shoulder in both long and short axis obtained. Biceps tendon appears normal fibrillar pattern without surrounding effusion. Subscapularis appears normal without obvious tears or abnormalities. The supraspinatus tendon is without tears and a good footprint, but does have calcifications.  The infraspinatus and teres minor tendons appear normal with no tears and a good footprints. The subdeltoid/subacromial bursa.  Glenohumeral joint is with a hypoechoic effusion and spurring present [facility] joint appears to have ample joint space and with mild spurring present.     ASSESSMENT & PLAN:  1. Subacromial bursitis: - CSI  today - Exercises provided  2. Osteoarthritis of L shoulder - Glenohumeral and subacromial CSI today: - Exercises provided F/u 3 w  Procedure:  Injection of subacromial bursa Consent obtained and verified. Time-out conducted. Noted no overlying erythema, induration, or other signs of local infection. Skin prepped in a sterile fashion. Topical analgesic spray: Ethyl chloride. Completed without difficulty. Meds: 40 mg Depomedrol, 44mL 1% lidocaine Pain immediately improved suggesting accurate placement of the medication. Advised to call if fevers/chills, erythema, induration, drainage, or persistent bleeding.   Procedure: Real-time Ultrasound Guided Injection of left glenohumeral joint Device:  Ultrasound guided injection is preferred based studies that show increased duration, increased effect, greater accuracy, decreased procedural pain, increased response rate, and decreased cost with ultrasound guided versus blind injection.  Verbal informed consent obtained.  Time-out conducted.  Noted no overlying erythema, induration, or other signs of local infection.  Skin prepped in a sterile fashion.  Local anesthesia: Topical Ethyl chloride.  With sterile technique and under real time ultrasound guidance:   Completed without difficulty  Pain immediately resolved suggesting accurate placement of the medication.  Advised to call if fevers/chills, erythema, induration, drainage, or persistent bleeding.  Impression: Technically successful ultrasound guided injection.

## 2016-06-11 NOTE — Progress Notes (Signed)
[place] Attending Note: I have seen and examined this patient with the medical student. I have  reviewed the history, physical examination, assessment and plan as documented in the medical student's note.  I agree with the medical student's note and findings, assessment and treatment plan as documented with the following additions or changes: 1. LEFT Thumb arthritis--CSI last visit. 60% improved. 3. LEFT shoulder pain: US shows some scarring in supraspinatuus and her bicep tendon, butthey are intact. She has a small effusion in her [place] joint. AC joint arthropathy without mushroom sign  GH and subacromial bursa injections both done today--I supervised Dr Elba Barman. I would liketo see her back in 3 weeks or so. Could consider second CSI for her thumb arthritis as itis severe. Unclear whether the CSI willhelp her shoudler issues---lots of wear and tear. Consider MRI shoulder to see if tehre is some surgical clean out arthroscopy that might be helpful.

## 2016-06-13 DIAGNOSIS — M19012 Primary osteoarthritis, left shoulder: Secondary | ICD-10-CM | POA: Insufficient documentation

## 2016-06-13 HISTORY — DX: Primary osteoarthritis, left shoulder: M19.012

## 2016-07-02 ENCOUNTER — Ambulatory Visit
Admission: RE | Admit: 2016-07-02 | Discharge: 2016-07-02 | Disposition: A | Discharge status: Home / Self Care | Payer: Commercial Managed Care - HMO | Source: Ambulatory Visit | Attending: Internal Medicine | Admitting: Internal Medicine

## 2016-07-02 DIAGNOSIS — Z1231 Encounter for screening mammogram for malignant neoplasm of breast: Secondary | ICD-10-CM

## 2016-07-02 DIAGNOSIS — E2839 Other primary ovarian failure: Secondary | ICD-10-CM

## 2016-07-16 ENCOUNTER — Ambulatory Visit: Payer: Commercial Managed Care - HMO | Admitting: Family Medicine

## 2016-08-17 ENCOUNTER — Other Ambulatory Visit: Payer: Self-pay | Admitting: Internal Medicine

## 2016-08-20 ENCOUNTER — Other Ambulatory Visit: Payer: Self-pay | Admitting: Internal Medicine

## 2016-08-23 ENCOUNTER — Encounter: Payer: Self-pay | Admitting: *Deleted

## 2016-09-08 ENCOUNTER — Other Ambulatory Visit: Payer: Self-pay | Admitting: Internal Medicine

## 2016-09-08 ENCOUNTER — Ambulatory Visit: Payer: Commercial Managed Care - HMO | Admitting: Internal Medicine

## 2016-09-08 NOTE — Progress Notes (Signed)
Spoke to [employer] and patient has recently picked up Metoprolol XL and HCTZ on 08/20/2016 and 07/21/2016.  Amlodipine last picked up 08/05/2016 and 07/07/2016.  Losartan last picked up 11/07/2015 for 90 days.

## 2016-09-13 ENCOUNTER — Ambulatory Visit (INDEPENDENT_AMBULATORY_CARE_PROVIDER_SITE_OTHER): Payer: Medicare Other | Admitting: Internal Medicine

## 2016-09-13 DIAGNOSIS — Z9114 Patient's other noncompliance with medication regimen: Secondary | ICD-10-CM | POA: Diagnosis not present

## 2016-09-13 DIAGNOSIS — I10 Essential (primary) hypertension: Secondary | ICD-10-CM | POA: Diagnosis not present

## 2016-09-13 DIAGNOSIS — E559 Vitamin D deficiency, unspecified: Secondary | ICD-10-CM | POA: Diagnosis not present

## 2016-09-13 MED ORDER — LORATADINE 10 MG PO TABS: 10.0000 mg | ORAL_TABLET | Freq: Every day | ORAL | 11 refills | Status: DC

## 2016-09-13 MED ORDER — VITAMIN D3 10 MCG (400 UNIT) PO CAPS: 800.0000 [IU] | ORAL_CAPSULE | Freq: Every day | ORAL | 1 refills | Status: DC

## 2016-09-13 MED ORDER — ASPIRIN 81 MG PO TBEC: 81.0000 mg | DELAYED_RELEASE_TABLET | Freq: Every day | ORAL | 2 refills | Status: DC

## 2016-09-13 MED ORDER — AMLODIPINE BESYLATE 5 MG PO TABS: 5.0000 mg | ORAL_TABLET | Freq: Every day | ORAL | 3 refills | Status: DC

## 2016-09-13 MED ORDER — LEVOTHYROXINE SODIUM 100 MCG PO TABS: ORAL_TABLET | ORAL | 3 refills | Status: DC

## 2016-09-13 MED ORDER — LOSARTAN POTASSIUM 100 MG PO TABS: 100.0000 mg | ORAL_TABLET | Freq: Every day | ORAL | 3 refills | Status: DC

## 2016-09-13 MED ORDER — METOPROLOL SUCCINATE ER 100 MG PO TB24: ORAL_TABLET | ORAL | 3 refills | Status: DC

## 2016-09-13 MED ORDER — MOMETASONE FUROATE 50 MCG/ACT NA SUSP: NASAL | 3 refills | Status: DC

## 2016-09-13 NOTE — Assessment & Plan Note (Addendum)
BP Readings from Last 3 Encounters:  09/13/16 (!) 171/65  06/11/16 (!) 163/59  05/28/16 (!) 158/69    Lab Results  Component Value Date   NA 142 05/25/2016   K 3.4 (L) 05/25/2016   CREATININE 1.00 05/25/2016   BP elevated today at 171/65. Reports being out of amlodipine for the past 2-3 days. She does not have the amlodipine or losartan with her today. She is suppose to be on losartan 100 mg daily, amlodipine 5 mg daily, HCTZ 25 mg daily and metoprolol 100 mg daily at home. Called her pharmacy today and it appears that she has not filled her Losartan since March 2017, a 3 month supply. Denies any headaches, vision changes, nausea, lightheadedness/dizziness.  Assessment: uncontrolled htn  Plan: Restart her home meds BMET today F/U in 2 weeks for BP re-check

## 2016-09-13 NOTE — Patient Instructions (Addendum)
Madison Jefferson,  I have sent in your medication refills today. It appears that you have not been taking the Losartan that Dr. Dareen Piano had prescribed. Your blood pressure is very elevated today and it is important to take these medications.   For your blood pressure you need to be taking losartan 100 mg daily, amlodipine 5 mg daily, hydrochlorothiazide 25 mg daily and metoprolol 100 mg daily.  Please start taking the vitamin D supplement as well.  I would like you to follow up in 2 weeks for recheck.

## 2016-09-13 NOTE — Assessment & Plan Note (Signed)
Vit D level low at last visit. Reports she has not been taking the Vit D at home until the last few days.  Reordered vit D today.   Re-check in 6-8 weeks.

## 2016-09-13 NOTE — Progress Notes (Signed)
   CC: Medication Refill  HPI:  [name] is a 66 y.o. female with a past medical history listed below here today for follow up of her medication refills for her HTN.  For details of today's visit and the status of her chronic medical issues please refer to the assessment and plan.  Past Medical History:  Diagnosis Date  . Allergy   . Arthritis   . Asthma   . Atrial fibrillation ([place])    h/o of isolated AF associated with hypothyroidism   . Diverticulosis    cecum  . Diverticulosis    colonoscopy 2002 [facility]  . Fatty liver   . GERD (gastroesophageal reflux disease)   . Hypertension   . Lumbar radiculopathy   . Thyroid disease    hypothyroid  . TIA (transient ischemic attack)    2006  . Urinary incontinence    mixed urge and stress   . Vitamin D deficiency     Review of Systems:   See HPI  Physical Exam:  Vitals:   09/13/16 1501  BP: (!) 171/65  Pulse: 64  Temp: 98 F (36.7 C)  TempSrc: Oral  SpO2: 100%  Weight: 188 lb 14.4 oz (85.7 kg)  Height: 5\' 1"  (1.549 m)   Physical Exam  Constitutional: She is well-developed, well-nourished, and in no distress.  Cardiovascular: Normal rate, regular rhythm and normal heart sounds.   Pulmonary/Chest: Effort normal and breath sounds normal.     Assessment & Plan:   See Encounters Tab for problem based charting.  Patient discussed with Dr. Dareen Piano

## 2016-09-14 LAB — BMP8+ANION GAP
Anion Gap: 17 mmol/L (ref 10.0–18.0)
BUN / CREAT RATIO: 15 (ref 12–28)
BUN: 12 mg/dL (ref 8–27)
CHLORIDE: 100 mmol/L (ref 96–106)
CO2: 26 mmol/L (ref 18–29)
Calcium: 8.8 mg/dL (ref 8.7–10.3)
Creatinine, Ser: 0.8 mg/dL (ref 0.57–1.00)
GFR calc Af Amer: 88 mL/min/{1.73_m2} (ref >59)
GFR calc non Af Amer: 77 mL/min/{1.73_m2} (ref >59)
GLUCOSE: 124 mg/dL — AB (ref 65–99)
POTASSIUM: 3.1 mmol/L — AB (ref 3.5–5.2)
SODIUM: 143 mmol/L (ref 134–144)

## 2016-09-16 NOTE — Progress Notes (Signed)
Internal Medicine Clinic Attending  Case discussed with Dr. Boswell at the time of the visit.  We reviewed the resident's history and exam and pertinent patient test results.  I agree with the assessment, diagnosis, and plan of care documented in the resident's note.  

## 2016-09-28 ENCOUNTER — Ambulatory Visit (INDEPENDENT_AMBULATORY_CARE_PROVIDER_SITE_OTHER): Payer: Medicare Other | Admitting: Internal Medicine

## 2016-09-28 ENCOUNTER — Ambulatory Visit: Payer: Commercial Managed Care - HMO | Admitting: Internal Medicine

## 2016-09-28 VITALS — BP 166/62 | HR 77 | Temp 98.3°F | Wt 191.0 lb

## 2016-09-28 DIAGNOSIS — I10 Essential (primary) hypertension: Secondary | ICD-10-CM

## 2016-09-28 DIAGNOSIS — Z79899 Other long term (current) drug therapy: Secondary | ICD-10-CM | POA: Diagnosis not present

## 2016-09-28 MED ORDER — SPIRONOLACTONE 25 MG PO TABS
25.0000 mg | ORAL_TABLET | Freq: Every day | ORAL | 1 refills | Status: DC
Start: 2016-09-28 — End: 2016-10-12

## 2016-09-28 MED ORDER — OLMESARTAN-AMLODIPINE-HCTZ 40-5-25 MG PO TABS: 1.0000 | ORAL_TABLET | Freq: Every day | ORAL | 1 refills | Status: DC

## 2016-09-28 NOTE — Assessment & Plan Note (Addendum)
BP Readings from Last 3 Encounters:  09/28/16 (!) 166/62  09/13/16 (!) 171/65  06/11/16 (!) 163/59  Patient restarted on Toprol XL 100 mg, Losartan 100 mg, Amlodipine 5 mg and HCTZ 25 mg daily at her last visit. Reviewed refill log and patient has picked up all medications within the past 2 weeks. She endorses compliance with all of these medications. Despite this effort, her blood pressure continues to remain elevated. 166/62 and was unchanged on repeat. Suspect there may be an aldosterone-related component and will treat accordingly -Start spironolactone 25 mg daily -Started combination pill to simplify her regimen (Olmesartan-Amlodipine-hctz 40-5-25) -Continue Toprol XL 100 mg daily -2 week follow up

## 2016-09-28 NOTE — Progress Notes (Signed)
   CC: Blood pressure recheck  HPI:  [name] is a 66 y.o. F with medical history as outlined below who presents today for blood pressure follow up. She has history of essential hypertension and was seen in clinic at the end of january for Rx refill. At that time, her BP was noted to be 171/65 after being out of her Amlodipine x 3 days. She is prescribed Losartan 100mg , Amlodipine 5mg , HCTZ 25 mg and Metoprolol 100mg . On closer review, patient had not refilled her Losartan since March 2017. She was subsequently restarted on her home medications and instructed to follow up for BP recheck. Denies any symptoms currently.   Past Medical History:  Diagnosis Date  . Allergy   . Arthritis   . Asthma   . Atrial fibrillation ([place])    h/o of isolated AF associated with hypothyroidism   . Diverticulosis    cecum  . Diverticulosis    colonoscopy 2002 [facility]  . Fatty liver   . GERD (gastroesophageal reflux disease)   . Hypertension   . Lumbar radiculopathy   . Thyroid disease    hypothyroid  . TIA (transient ischemic attack)    2006  . Urinary incontinence    mixed urge and stress   . Vitamin D deficiency    Review of Systems:  Review of Systems  Constitutional: Negative for chills, fever and weight loss.  Eyes: Negative for blurred vision and double vision.  Respiratory: Negative for cough and shortness of breath.   Cardiovascular: Negative for chest pain and palpitations.  Gastrointestinal: Negative for abdominal pain, nausea and vomiting.  Neurological: Negative for dizziness, sensory change, weakness and headaches.   Physical Exam: Physical Exam  Constitutional: She is oriented to person, place, and time. She appears well-developed and well-nourished. No distress.  HENT:  Head: Normocephalic and atraumatic.  Eyes: Pupils are equal, round, and reactive to light. No scleral icterus.  Cardiovascular: Normal rate, regular rhythm and normal heart sounds.   Pulmonary/Chest:  Effort normal and breath sounds normal. No respiratory distress. She has no wheezes.  Abdominal: Soft. Bowel sounds are normal. She exhibits no distension. There is no tenderness.  Neurological: She is alert and oriented to person, place, and time.  Skin: Skin is warm and dry. Capillary refill takes less than 2 seconds. No rash noted. She is not diaphoretic.  Psychiatric: She has a normal mood and affect. Her behavior is normal.   Vitals:   09/28/16 1445  BP: (!) 166/62  Pulse: 77  Temp: 98.3 F (36.8 C)  TempSrc: Oral  SpO2: 100%  Weight: 191 lb (86.6 kg)   BP Recheck: 162/62  Assessment & Plan:   See Encounters Tab for problem based charting.  Patient discussed with Dr. Lynnae January

## 2016-09-28 NOTE — Patient Instructions (Addendum)
It was a pleasure meeting you today!  Today we talked about your blood pressure. You are doing a great job taking your medications however your blood pressure is still elevated today. It's important that we get your blood pressure under control. I am starting you on a new medication called Spironolactone. You will take one of these daily in addition to your current medications. In an effort to simplify your routine, I have sent in a 3-in-1 medication to your pharmacy. This may be expensive. If you can afford this, this medication will REPLACE your Hydrochlorothiazide, Amlodipine and Losartan. You will still need to take Metoprolol.  If you cannot afford this medicine, that is just fine! Please take Amlodipine 5 mg, Hydrochlorothiazide 25 mg, Losartan 100 mg, Metoprolol 100 mg and Spironolactone 25 mg.  Please follow up here in the clinic in 2 weeks to see how your blood pressure is doing!

## 2016-10-04 NOTE — Progress Notes (Signed)
Internal Medicine Clinic Attending  Case discussed with Dr. Molt at the time of the visit.  We reviewed the resident's history and exam and pertinent patient test results.  I agree with the assessment, diagnosis, and plan of care documented in the resident's note. 

## 2016-10-11 ENCOUNTER — Telehealth: Payer: Self-pay | Admitting: Internal Medicine

## 2016-10-11 NOTE — Telephone Encounter (Signed)
APT. REMINDER CALL, LMTCB °

## 2016-10-12 ENCOUNTER — Encounter: Payer: Self-pay | Admitting: Internal Medicine

## 2016-10-12 ENCOUNTER — Ambulatory Visit (INDEPENDENT_AMBULATORY_CARE_PROVIDER_SITE_OTHER): Payer: Medicare Other | Admitting: Internal Medicine

## 2016-10-12 VITALS — BP 149/57 | HR 65 | Temp 98.0°F | Wt 189.6 lb

## 2016-10-12 DIAGNOSIS — Z79899 Other long term (current) drug therapy: Secondary | ICD-10-CM | POA: Diagnosis not present

## 2016-10-12 DIAGNOSIS — I10 Essential (primary) hypertension: Secondary | ICD-10-CM | POA: Diagnosis not present

## 2016-10-12 MED ORDER — SPIRONOLACTONE 50 MG PO TABS: 50.0000 mg | ORAL_TABLET | Freq: Every day | ORAL | 11 refills | Status: DC

## 2016-10-12 NOTE — Patient Instructions (Signed)
Increase your spironolactone to 2 pills once a day (total of 50 mg per day). Once you run out, you can pick up the new prescription for the 50 mg tablets and take 1 pills once a day. We will check your blood work today.  Please follow up in one month.

## 2016-10-12 NOTE — Progress Notes (Signed)
    CC: Follow up for HTN  HPI: Madison Jefferson is a 66 y.o. female with PMHx of HTN, Asthma who presents to the clinic for follow up for HTN.  Patient was seen in January and found to be hypertensive at 171/65. At that time, patient was supposed to be taking losartan 100 mg daily amlodipine 5 mg daily HCTZ 25 mg daily and metoprolol 100 mg daily. However, she had not refilled her losartan. At follow-up visit on 09/28/2016, patient continued to be hypertensive at 166/62 despite taking all 4 medications. There was a presumed aldosterone component given her uncontrolled hypertension on 4 medications and hypokalemia. Patient was started on spironolactone 25 mg daily and switch to a combination pill of olmesartan 40 mg daily-amlodipine 5 mg daily-hydrochlorothiazide 25 mg daily. And continued on metoprolol 100 mg daily. Today, patient states she is doing well. She denies any chest pain, shortness of breath, lightheadedness, headache. She reports compliance with her combination pill, metoprolol, and the spironolactone. She reports she is tolerating the spironolactone well. Her blood pressure is improved to 149/57 with a heart rate is 65. On repeat, blood pressure is 142/65.  Past Medical History:  Diagnosis Date  . Allergy   . Arthritis   . Asthma   . Atrial fibrillation ([place])    h/o of isolated AF associated with hypothyroidism   . Diverticulosis    cecum  . Diverticulosis    colonoscopy 2002 [facility]  . Fatty liver   . GERD (gastroesophageal reflux disease)   . Hypertension   . Lumbar radiculopathy   . Thyroid disease    hypothyroid  . TIA (transient ischemic attack)    2006  . Urinary incontinence    mixed urge and stress   . Vitamin D deficiency      Review of Systems: Please see pertinent ROS reviewed in HPI and problem based charting.   Physical Exam: Vitals:   10/12/16 1504  BP: (!) 149/57  Pulse: 65  Temp: 98 F (36.7 C)  TempSrc: Oral  SpO2: 100%  Weight: 189 lb  9.6 oz (86 kg)   General: Vital signs reviewed.  Patient is well-developed and well-nourished, in no acute distress and cooperative with exam.  Neck: Supple, trachea midline, no carotid bruit present.  Cardiovascular: RRR, S1 normal, S2 normal, no murmurs, gallops, or rubs. Pulmonary/Chest: Clear to auscultation bilaterally, no wheezes, rales, or rhonchi. Abdominal: Soft, non-tender, non-distended, BS + Extremities: No lower extremity edema bilaterally Skin: Warm, dry and intact. No rashes or erythema. Psychiatric: Flat mood and affect. speech and behavior is normal. Cognition and memory are normal.   Assessment & Plan:  See encounters tab for problem based medical decision making. Patient discussed with Dr. Lynnae January

## 2016-10-13 ENCOUNTER — Encounter: Payer: Self-pay | Admitting: Internal Medicine

## 2016-10-13 LAB — BMP8+ANION GAP
Anion Gap: 17 mmol/L (ref 10.0–18.0)
BUN/Creatinine Ratio: 19 (ref 12–28)
BUN: 17 mg/dL (ref 8–27)
CALCIUM: 9.4 mg/dL (ref 8.7–10.3)
CHLORIDE: 98 mmol/L (ref 96–106)
CO2: 26 mmol/L (ref 18–29)
Creatinine, Ser: 0.89 mg/dL (ref 0.57–1.00)
GFR calc Af Amer: 77 mL/min/{1.73_m2} (ref >59)
GFR, EST NON AFRICAN AMERICAN: 67 mL/min/{1.73_m2} (ref >59)
GLUCOSE: 74 mg/dL (ref 65–99)
POTASSIUM: 3.8 mmol/L (ref 3.5–5.2)
Sodium: 141 mmol/L (ref 134–144)

## 2016-10-13 NOTE — Progress Notes (Signed)
Internal Medicine Clinic Attending  Case discussed with Dr. Burns at the time of the visit.  We reviewed the resident's history and exam and pertinent patient test results.  I agree with the assessment, diagnosis, and plan of care documented in the resident's note.  

## 2016-10-13 NOTE — Assessment & Plan Note (Signed)
Patient was seen in January and found to be hypertensive at 171/65. At that time, patient was supposed to be taking losartan 100 mg daily amlodipine 5 mg daily HCTZ 25 mg daily and metoprolol 100 mg daily. However, she had not refilled her losartan. At follow-up visit on 09/28/2016, patient continued to be hypertensive at 166/62 despite taking all 4 medications. There was a presumed aldosterone component given her uncontrolled hypertension on 4 medications and hypokalemia. Patient was started on spironolactone 25 mg daily and switch to a combination pill of olmesartan 40 mg daily-amlodipine 5 mg daily-hydrochlorothiazide 25 mg daily. And continued on metoprolol 100 mg daily.   History of present illness: Today, patient states she is doing well. She denies any chest pain, shortness of breath, lightheadedness, headache. She reports compliance with her combination pill, metoprolol, and the spironolactone. She reports she is tolerating the spironolactone well. Her blood pressure is improved to 149/57 with a heart rate is 65. On repeat, blood pressure is 142/65.  Assessment: Uncontrolled hypertension  Plan: -Increase spironolactone to 50 mg daily -Repeat basic metabolic panel today -Continue metoprolol 100 mg daily -Continue combination pill olmesartan-amlodipine-hydrochlorothiazide 40-5-25 mg QD -Follow-up in one month  Addendum: Basic metabolic panel reveals normal kidney function and improved potassium to 3.8.

## 2016-10-22 ENCOUNTER — Ambulatory Visit (INDEPENDENT_AMBULATORY_CARE_PROVIDER_SITE_OTHER): Payer: Medicare Other | Admitting: Family Medicine

## 2016-10-22 DIAGNOSIS — M7501 Adhesive capsulitis of right shoulder: Secondary | ICD-10-CM | POA: Diagnosis not present

## 2016-10-25 DIAGNOSIS — M7501 Adhesive capsulitis of right shoulder: Secondary | ICD-10-CM | POA: Insufficient documentation

## 2016-10-25 NOTE — Progress Notes (Signed)
  Madison Jefferson - 66 y.o. female MRN [id number]  Date of birth: 11/20/1950    SUBJECTIVE:      Chief Complaint:/ HPI:   Right shoulder pain last 2 months.  Has had similar problems in the left shoulder and had done well in the past when I gave her injection in the left shoulder.  That was back in October.  The right shoulder is giving her pain when she tries to do anything over shoulder height such as pressure hair.  Also aching at night.  She is right-handed.  Pain is 6 out of 10 at its worst.  Pain is fairly constant throughout the day and problematic and she tries to fall sleep at night.  She's had no numbness in her hand.  No weakness in her right upper arm or right hand.   ROS:     No unusual weight change, no fever, no rash noted about the right shoulder.  See HPI above for additional pertinent review of systems.  PERTINENT  PMH / PSH FH / / SH:  Past Medical, Surgical, Social, and Family History Reviewed & Updated in the EMR.  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  Physical Exam:  Vital signs are reviewed. Well-developed female no acute distress SHOULDERS: Symmetrical.  Pain with supraspinatus testing.  She has trouble passively or actively getting her right shoulder above 90 and forward flexion or 100 in abduction.  Pain with internal rotation such that she can only flex her hand at her lateral hip.  Biceps strength is normal. NEURO: Intact soft touch sensation bilateral hands.  Normal grip strength bilateral hands. PAST medical and radial pulses 2+ bilaterally equal.  Normal capillary refill bilateral hands.  INJECTION: Patient was given informed consent, signed copy in the chart. Appropriate time out was taken. Area prepped and draped in usual sterile fashion. 1 cc of methylprednisolone 40 mg/ml plus  4 cc of 1% lidocaine without  epinephrine was injected into the Right subacromial bursa using a(n) Posterior approach. The patient tolerated the procedure well. There were no complications. Post procedure instructions were given.   ASSESSMENT & PLAN: Right shoulder pain, early adhesive capsulitis.  Subacromial bursa injection today and will have her do some range of motion exercises specifically wall crawls.  Let's see her back in 2 weeks to see how much progress she is made.  May benefit from glenohumeral joint injection.  We do not have any imaging of her shoulder and it might be beneficial to get that at some point given her history of significant OA in her left shoulder.

## 2016-11-16 ENCOUNTER — Ambulatory Visit: Payer: Medicare Other | Admitting: Internal Medicine

## 2016-11-16 ENCOUNTER — Encounter: Payer: Self-pay | Admitting: Internal Medicine

## 2016-11-25 ENCOUNTER — Encounter: Payer: Self-pay | Admitting: Internal Medicine

## 2016-11-25 ENCOUNTER — Encounter (INDEPENDENT_AMBULATORY_CARE_PROVIDER_SITE_OTHER): Payer: Self-pay

## 2016-11-25 ENCOUNTER — Ambulatory Visit (INDEPENDENT_AMBULATORY_CARE_PROVIDER_SITE_OTHER): Payer: Medicare Other | Admitting: Internal Medicine

## 2016-11-25 DIAGNOSIS — I10 Essential (primary) hypertension: Secondary | ICD-10-CM | POA: Diagnosis not present

## 2016-11-25 DIAGNOSIS — Z79899 Other long term (current) drug therapy: Secondary | ICD-10-CM

## 2016-11-25 NOTE — Patient Instructions (Addendum)
General Instructions: - Blood pressure is much better! - Continue your current regimen - Follow up with Dr. Dareen Piano in 6 months or sooner if needed  Thank you for bringing your medicines today. This helps [place] keep you safe from mistakes.   Progress Toward Treatment Goals:  Treatment Goal 04/03/2014  Blood pressure at goal    Self Care Goals & Plans:  Self Care Goal 05/25/2016  Manage my medications take my medicines as prescribed; bring my medications to every visit; refill my medications on time  Monitor my health keep track of my blood pressure; bring my blood pressure log to each visit  Eat healthy foods drink diet soda or water instead of juice or soda; eat more vegetables; eat foods that are low in salt; eat baked foods instead of fried foods; eat fruit for snacks and desserts  Be physically active find an activity I enjoy  Meeting treatment goals maintain the current self-care plan    No flowsheet data found.   Care Management & Community Referrals:  Referral 12/12/2013  Referrals made for care management support none needed  Referrals made to community resources none

## 2016-11-25 NOTE — Progress Notes (Signed)
   CC: Hypertension follow-up  HPI:  [name] is a 66 y.o. woman with past medical history as noted below who presents today for follow-up of her hypertension.  BP is 136/49. She is taking spironolactone 50 mg daily, metoprolol 100 mg daily, and combination pill Olmesartan-Amlodipine-HCTZ 40-5-25 mg daily. She occasional dizziness, but this is very seldom. She denies any presyncope or falls.  Past Medical History:  Diagnosis Date  . Allergy   . Arthritis   . Asthma   . Atrial fibrillation ([place])    h/o of isolated AF associated with hypothyroidism   . Diverticulosis    cecum  . Diverticulosis    colonoscopy 2002 [facility]  . Fatty liver   . GERD (gastroesophageal reflux disease)   . Hypertension   . Lumbar radiculopathy   . Thyroid disease    hypothyroid  . TIA (transient ischemic attack)    2006  . Urinary incontinence    mixed urge and stress   . Vitamin D deficiency     Review of Systems:   General: Denies fever, chills, night sweats, changes in weight, changes in appetite HEENT: Denies headaches, ear pain, changes in vision, rhinorrhea, sore throat CV: Denies CP, palpitations, SOB, orthopnea Pulm: Denies SOB, cough, wheezing GI: Denies abdominal pain, nausea, vomiting, diarrhea, constipation, melena, hematochezia GU: Denies dysuria, hematuria, frequency Msk: Denies muscle cramps, joint pains Neuro: Denies weakness, numbness, tingling Skin: Denies rashes, bruising Psych: Denies depression, anxiety, hallucinations  Physical Exam:  Vitals:   11/25/16 1436  BP: (!) 136/49  Pulse: 63  Temp: 97.8 F (36.6 C)  TempSrc: Oral  SpO2: 100%  Weight: 190 lb (86.2 kg)   General: Well-nourished elderly woman in NAD CV: RRR, no m/g/r Pulm: CTA bilaterally  Assessment & Plan:   See Encounters Tab for problem based charting.  Patient discussed with Dr. Dareen Piano

## 2016-11-26 NOTE — Assessment & Plan Note (Signed)
BP within good range. Would be hesitant to add any additional antihypertensives given her seldom episodes of dizziness with her current regimen. Will continue spironolactone 50 mg daily, metoprolol 100 mg daily, and combo pill Olmesartan-Amlodipine-HCTZ 40-5-25 mg daily.

## 2016-11-30 ENCOUNTER — Other Ambulatory Visit: Payer: Self-pay | Admitting: Internal Medicine

## 2016-11-30 NOTE — Progress Notes (Signed)
Internal Medicine Clinic Attending  Case discussed with Dr. Rivet at the time of the visit.  We reviewed the resident's history and exam and pertinent patient test results.  I agree with the assessment, diagnosis, and plan of care documented in the resident's note.  

## 2017-06-01 ENCOUNTER — Other Ambulatory Visit: Payer: Self-pay | Admitting: Internal Medicine

## 2017-06-02 ENCOUNTER — Encounter: Payer: Self-pay | Admitting: Internal Medicine

## 2017-06-02 ENCOUNTER — Ambulatory Visit (INDEPENDENT_AMBULATORY_CARE_PROVIDER_SITE_OTHER): Payer: Medicare Other | Admitting: Internal Medicine

## 2017-06-02 VITALS — BP 153/60 | HR 56 | Temp 98.1°F | Ht 61.0 in | Wt 194.6 lb

## 2017-06-02 DIAGNOSIS — I10 Essential (primary) hypertension: Secondary | ICD-10-CM

## 2017-06-02 DIAGNOSIS — Z79899 Other long term (current) drug therapy: Secondary | ICD-10-CM | POA: Diagnosis not present

## 2017-06-02 DIAGNOSIS — J029 Acute pharyngitis, unspecified: Secondary | ICD-10-CM | POA: Insufficient documentation

## 2017-06-02 DIAGNOSIS — J302 Other seasonal allergic rhinitis: Secondary | ICD-10-CM

## 2017-06-02 MED ORDER — METOPROLOL SUCCINATE ER 100 MG PO TB24: ORAL_TABLET | ORAL | 3 refills | Status: DC

## 2017-06-02 MED ORDER — OLMESARTAN-AMLODIPINE-HCTZ 40-5-25 MG PO TABS: 1.0000 | ORAL_TABLET | Freq: Every day | ORAL | 1 refills | Status: DC

## 2017-06-02 NOTE — Assessment & Plan Note (Signed)
BP Readings from Last 3 Encounters:  06/02/17 (!) 153/60  11/25/16 (!) 136/49  10/12/16 (!) 149/57   BP is slightly elevated today. Will continue current medications for now as she is on 5 drug therapy. Reassess on follow up. She does have room to increase her Amlodipine dosing if needed. - Continue Olmesartan-Amlodipine-HCTZ 40-5-25, Spironolactone 50 mg daily, and Metoprolol 100 mg daily

## 2017-06-02 NOTE — Patient Instructions (Signed)
It was a pleasure to meet you Ms. Nappier.  Please continue the following for your sore throat: - Nasonex 1 spray each nostril for at least 1-2 weeks - Loratadine once daily - Tylenol or Ibuprofen (Advil) as needed - Warm fluids, teas with honey, and soups. Can try letting ice chips or popsicles melt in mouth  I have refilled your blood pressure medication.  Please follow up with Dr. Dareen Piano in about 3 months or see [place] sooner if needed.

## 2017-06-02 NOTE — Progress Notes (Signed)
CC: Sore throat  HPI:  [name] is a 66 y.o. female with PMH as listed below who presents for sore throat and medication refill.  Sore throat: Patient reports 1 day of sore throat sensation with associated sneezing and watery/puffy eyes. She has a history of seasonal allergies which has felt similar in the past. She denies any rhinorrhea, congestion, sinus tenderness, fevers, chills, post-nasal drip, cough, heartburn, or dyspnea. She has started using Nasonex and Loratadine now. She uses tylenol with some relief.  HTN: BP is 153/60. She is currently taking Olmesartan-Amlodipine-HCTZ 40-5-25, Spironolactone 50 mg daily, and Metoprolol 100 mg daily. She reports adherence. She is requesting a refill of her combination medication.   Past Medical History:  Diagnosis Date  . Allergy   . Arthritis   . Asthma   . Atrial fibrillation ([place])    h/o of isolated AF associated with hypothyroidism   . Diverticulosis    cecum  . Diverticulosis    colonoscopy 2002 [facility]  . Fatty liver   . GERD (gastroesophageal reflux disease)   . Hypertension   . Lumbar radiculopathy   . Thyroid disease    hypothyroid  . TIA (transient ischemic attack)    2006  . Urinary incontinence    mixed urge and stress   . Vitamin D deficiency    Review of Systems:   Review of Systems  Constitutional: Negative for chills, diaphoresis and fever.  HENT: Positive for sore throat. Negative for congestion and sinus pain.   Eyes:       Puffy, watery eyes  Respiratory: Negative for cough, sputum production and shortness of breath.   Neurological: Negative for headaches.     Physical Exam:  Vitals:   06/02/17 0835  BP: (!) 153/60  Pulse: (!) 56  Temp: 98.1 F (36.7 C)  TempSrc: Oral  SpO2: 99%  Weight: 194 lb 9.6 oz (88.3 kg)  Height: 5\' 1"  (1.549 m)   Physical Exam  Constitutional: She is oriented to person, place, and time. She appears well-developed and well-nourished. No distress.    HENT:  Head: Normocephalic and atraumatic.  Mouth/Throat: Oropharynx is clear and moist. No oropharyngeal exudate.  Eyes: EOM are normal.  Neck: Neck supple. No tracheal deviation present.  Cardiovascular: Normal rate and regular rhythm.   No murmur heard. Pulmonary/Chest: Effort normal. No respiratory distress. She has no wheezes. She has no rales.  Lymphadenopathy:    She has no cervical adenopathy.  Neurological: She is alert and oriented to person, place, and time.  Skin: Skin is warm. She is not diaphoretic.  Psychiatric: She has a normal mood and affect.    Assessment & Plan:   See Encounters Tab for problem based charting.  Patient discussed with Dr. Daryll Drown  Sore throat Patient has sore throat for one day that is likely early stages of a viral URI or her seasonal allergies. She does not have lymphadenopathy and her oral mucosa is clear without erythema. She has already begun appropriate therapy which we will continue. - Nasonex 1 spray each nostril for at least 1-2 weeks - Loratadine once daily - Tylenol or Ibuprofen (Advil) as needed - Warm fluids, teas with honey, and soups. Can try letting ice chips or popsicles melt in mouth. - f/u with [place] if symptoms worsen or do not improve  HYPERTENSION, BENIGN BP Readings from Last 3 Encounters:  06/02/17 (!) 153/60  11/25/16 (!) 136/49  10/12/16 (!) 149/57   BP is slightly elevated today.  Will continue current medications for now as she is on 5 drug therapy. Reassess on follow up. She does have room to increase her Amlodipine dosing if needed. - Continue Olmesartan-Amlodipine-HCTZ 40-5-25, Spironolactone 50 mg daily, and Metoprolol 100 mg daily

## 2017-06-02 NOTE — Assessment & Plan Note (Signed)
Patient has sore throat for one day that is likely early stages of a viral URI or her seasonal allergies. She does not have lymphadenopathy and her oral mucosa is clear without erythema. She has already begun appropriate therapy which we will continue. - Nasonex 1 spray each nostril for at least 1-2 weeks - Loratadine once daily - Tylenol or Ibuprofen (Advil) as needed - Warm fluids, teas with honey, and soups. Can try letting ice chips or popsicles melt in mouth. - f/u with [place] if symptoms worsen or do not improve

## 2017-06-03 NOTE — Progress Notes (Signed)
Internal Medicine Clinic Attending  Case discussed with Dr. Patel at the time of the visit.  We reviewed the resident's history and exam and pertinent patient test results.  I agree with the assessment, diagnosis, and plan of care documented in the resident's note.  

## 2017-06-09 ENCOUNTER — Encounter: Payer: Self-pay | Admitting: Internal Medicine

## 2017-06-09 ENCOUNTER — Other Ambulatory Visit: Payer: Self-pay | Admitting: Internal Medicine

## 2017-06-09 ENCOUNTER — Ambulatory Visit (INDEPENDENT_AMBULATORY_CARE_PROVIDER_SITE_OTHER): Payer: Medicare Other | Admitting: Internal Medicine

## 2017-06-09 DIAGNOSIS — I10 Essential (primary) hypertension: Secondary | ICD-10-CM | POA: Diagnosis not present

## 2017-06-09 DIAGNOSIS — Z79899 Other long term (current) drug therapy: Secondary | ICD-10-CM | POA: Diagnosis not present

## 2017-06-09 MED ORDER — OLMESARTAN-AMLODIPINE-HCTZ 40-10-25 MG PO TABS: 1.0000 | ORAL_TABLET | Freq: Every day | ORAL | 0 refills | Status: DC

## 2017-06-09 MED ORDER — OLMESARTAN-AMLODIPINE-HCTZ 40-10-25 MG PO TABS: 1.0000 | ORAL_TABLET | Freq: Every day | ORAL | 1 refills | Status: DC

## 2017-06-09 NOTE — Progress Notes (Signed)
   CC: BP follow up   HPI:  [name] is a 66 y.o. female with past medical history outlined below here for BP follow up. For the details of today's visit, please refer to the assessment and plan.  Past Medical History:  Diagnosis Date  . Allergy   . Arthritis   . Asthma   . Atrial fibrillation ([place])    h/o of isolated AF associated with hypothyroidism   . Diverticulosis    cecum  . Diverticulosis    colonoscopy 2002 [facility]  . Fatty liver   . GERD (gastroesophageal reflux disease)   . Hypertension   . Lumbar radiculopathy   . Thyroid disease    hypothyroid  . TIA (transient ischemic attack)    2006  . Urinary incontinence    mixed urge and stress   . Vitamin D deficiency     Review of Systems  Respiratory: Negative for shortness of breath.   Cardiovascular: Negative for chest pain.    Physical Exam:  Vitals:   06/09/17 0850  BP: 140/62  Pulse: 60  Temp: 97.9 F (36.6 C)  TempSrc: Oral  SpO2: 100%  Weight: 194 lb (88 kg)  Height: 5\' 1"  (1.549 m)    Constitutional: NAD, appears comfortable Cardiovascular: RRR, no murmurs, rubs, or gallops.  Pulmonary/Chest: CTAB, no wheezes, rales, or rhonchi. Extremities: Warm and well perfused. No edema.  Psychiatric: Normal mood and affect  Assessment & Plan:   See Encounters Tab for problem based charting.  Patient discussed with Dr. Dareen Piano

## 2017-06-09 NOTE — Patient Instructions (Signed)
Madison Jefferson,  It was a pleasure to see you today. I have increased the dose of your blood pressure medicine. Please stop taking your old prescription and pick up the new prescription at your pharmacy. Follow up with [place] in 1 month to have your blood pressure rechecked. If you have any questions or concerns, call our clinic at [phone] or after hours call [phone] and ask for the internal medicine resident on call. Thank you!  - Dr. Philipp Ovens

## 2017-06-10 NOTE — Assessment & Plan Note (Signed)
BP Readings from Last 3 Encounters:  06/09/17 140/62  06/02/17 (!) 153/60  11/25/16 (!) 136/49   Patient is here for blood pressure follow-up. She is prescribed olmesartan-amlodipine-hctz 40-5-25 mg daily, metoprolol 100 mg daily, and spironolactone 50 mg which she reports restarting last week after getting it refilled. Since restarting spironolactone, patient reports side effects of GI upset, nausea, and dizziness. Side effects resolved after stopping spironolactone. Blood pressure today is 140/62. We will discontinue spironolactone for now and increase olmesartan-amlodipine-hctz to 40-10-25 mg daily. Follow up in 1 month.  -- Stop spironolactone  -- Increase olmesartan-amlodipine-hctz to 40-10-25 mg daily -- Continue Metoprolol 100 mg daily  -- 1 month f/u

## 2017-06-10 NOTE — Progress Notes (Signed)
Internal Medicine Clinic Attending  Case discussed with Dr. Guilloud at the time of the visit.  We reviewed the resident's history and exam and pertinent patient test results.  I agree with the assessment, diagnosis, and plan of care documented in the resident's note.  

## 2017-06-15 ENCOUNTER — Other Ambulatory Visit: Payer: Self-pay | Admitting: Internal Medicine

## 2017-07-11 ENCOUNTER — Other Ambulatory Visit: Payer: Self-pay

## 2017-07-11 ENCOUNTER — Ambulatory Visit (INDEPENDENT_AMBULATORY_CARE_PROVIDER_SITE_OTHER): Payer: Medicare Other | Admitting: Internal Medicine

## 2017-07-11 DIAGNOSIS — I10 Essential (primary) hypertension: Secondary | ICD-10-CM

## 2017-07-11 DIAGNOSIS — Z79899 Other long term (current) drug therapy: Secondary | ICD-10-CM | POA: Diagnosis not present

## 2017-07-11 NOTE — Assessment & Plan Note (Addendum)
BP Readings from Last 3 Encounters:  07/11/17 (!) 138/52  06/09/17 140/62  06/02/17 (!) 153/60    Lab Results  Component Value Date   NA 141 10/12/2016   K 3.8 10/12/2016   CREATININE 0.89 10/12/2016   Here for BP follow up. BP today is 138/52 with HR 65. She is currently prescribed olmesartan-amlodipine-HCTZ 40-10-25 (amlodipine increased from 5 last visit) and metoprolol 100 mg daily. She was unable to tolerate spironolactone due to GI side effects last visit and it was discontinued. She reports doing well on the increased dose of her combination pill. Denies any headaches, vision changes, chest pain, SOB, nausea/vomiting, dizziness/lightheadedness. Reports she does not check her BP at home. Brings her medications with her today and has the appropriate prescriptions.    A/P: Continue current medications Will check BMET today  Follow up with PCP in 3 months

## 2017-07-11 NOTE — Progress Notes (Signed)
   CC: HTN follow up  HPI:  [name] is a 66 y.o. female with a past medical history listed below here today for follow up of her HTN.  For details of today's visit and the status of her chronic medical issues please refer to the assessment and plan.  Past Medical History:  Diagnosis Date  . Allergy   . Arthritis   . Asthma   . Atrial fibrillation ([place])    h/o of isolated AF associated with hypothyroidism   . Diverticulosis    cecum  . Diverticulosis    colonoscopy 2002 [facility]  . Fatty liver   . GERD (gastroesophageal reflux disease)   . Hypertension   . Lumbar radiculopathy   . Thyroid disease    hypothyroid  . TIA (transient ischemic attack)    2006  . Urinary incontinence    mixed urge and stress   . Vitamin D deficiency    Review of Systems:   No chest pain or shortness of breath  Physical Exam:  Vitals:   07/11/17 0833  BP: (!) 138/52  Pulse: 65  Temp: 98 F (36.7 C)  TempSrc: Oral  SpO2: 100%  Weight: 194 lb 11.2 oz (88.3 kg)  Height: 5\' 1"  (1.549 m)   GENERAL- alert, co-operative, appears as stated age, not in any distress. CARDIAC- RRR, no murmurs, rubs or gallops. RESP- Moving equal volumes of air, and clear to auscultation bilaterally, no wheezes or crackles. ABDOMEN- Soft, nontender, bowel sounds present. EXTREMITIES- pulse 2+, symmetric, no pedal edema. SKIN- Warm, dry, No rash or lesion. PSYCH- Normal mood and affect  Assessment & Plan:   See Encounters Tab for problem based charting.  Patient discussed with Dr. Rebeca Alert

## 2017-07-11 NOTE — Patient Instructions (Signed)
Ms. Gebhardt,  Your blood pressure is doing much better. We will keep with the medications you are on today without any changes.  I would like for you to follow up with Dr. Dareen Piano in 3 months for follow up.

## 2017-07-11 NOTE — Progress Notes (Signed)
Internal Medicine Clinic Attending  Case discussed with Dr. Boswell  at the time of the visit.  We reviewed the resident's history and exam and pertinent patient test results.  I agree with the assessment, diagnosis, and plan of care documented in the resident's note.  Alexander N Raines, MD   

## 2017-07-12 LAB — BMP8+ANION GAP
Anion Gap: 14 mmol/L (ref 10.0–18.0)
BUN/Creatinine Ratio: 14 (ref 12–28)
BUN: 14 mg/dL (ref 8–27)
CO2: 27 mmol/L (ref 20–29)
Calcium: 9.2 mg/dL (ref 8.7–10.3)
Chloride: 103 mmol/L (ref 96–106)
Creatinine, Ser: 1 mg/dL (ref 0.57–1.00)
GFR calc Af Amer: 67 mL/min/{1.73_m2} (ref >59)
GFR calc non Af Amer: 58 mL/min/{1.73_m2} — ABNORMAL LOW (ref >59)
Glucose: 108 mg/dL — ABNORMAL HIGH (ref 65–99)
Potassium: 3.9 mmol/L (ref 3.5–5.2)
Sodium: 144 mmol/L (ref 134–144)

## 2017-07-21 ENCOUNTER — Other Ambulatory Visit: Payer: Self-pay | Admitting: Internal Medicine

## 2017-09-01 ENCOUNTER — Other Ambulatory Visit: Payer: Self-pay | Admitting: Internal Medicine

## 2017-09-01 DIAGNOSIS — Z139 Encounter for screening, unspecified: Secondary | ICD-10-CM

## 2017-09-13 ENCOUNTER — Ambulatory Visit
Admission: RE | Admit: 2017-09-13 | Discharge: 2017-09-13 | Disposition: A | Discharge status: Home / Self Care | Payer: Medicare Other | Source: Ambulatory Visit | Attending: Internal Medicine | Admitting: Internal Medicine

## 2017-09-13 DIAGNOSIS — Z1231 Encounter for screening mammogram for malignant neoplasm of breast: Secondary | ICD-10-CM | POA: Diagnosis not present

## 2017-09-13 DIAGNOSIS — Z139 Encounter for screening, unspecified: Secondary | ICD-10-CM

## 2017-09-14 ENCOUNTER — Other Ambulatory Visit: Payer: Self-pay | Admitting: Internal Medicine

## 2017-09-15 NOTE — Telephone Encounter (Signed)
Next appt scheduled 2/5 with PCP. 

## 2017-09-15 NOTE — Telephone Encounter (Signed)
Next appt scheduled  09/20/17 with PCP.

## 2017-09-17 ENCOUNTER — Other Ambulatory Visit: Payer: Self-pay | Admitting: Internal Medicine

## 2017-09-20 ENCOUNTER — Encounter: Payer: Self-pay | Admitting: Internal Medicine

## 2017-09-20 ENCOUNTER — Other Ambulatory Visit: Payer: Self-pay

## 2017-09-20 ENCOUNTER — Ambulatory Visit (INDEPENDENT_AMBULATORY_CARE_PROVIDER_SITE_OTHER): Payer: Medicare Other | Admitting: Internal Medicine

## 2017-09-20 VITALS — BP 146/70 | HR 59 | Temp 97.7°F | Ht 61.0 in | Wt 197.3 lb

## 2017-09-20 DIAGNOSIS — E039 Hypothyroidism, unspecified: Secondary | ICD-10-CM | POA: Diagnosis not present

## 2017-09-20 DIAGNOSIS — Z79899 Other long term (current) drug therapy: Secondary | ICD-10-CM | POA: Diagnosis not present

## 2017-09-20 DIAGNOSIS — J069 Acute upper respiratory infection, unspecified: Secondary | ICD-10-CM | POA: Insufficient documentation

## 2017-09-20 DIAGNOSIS — J45909 Unspecified asthma, uncomplicated: Secondary | ICD-10-CM | POA: Diagnosis not present

## 2017-09-20 DIAGNOSIS — E559 Vitamin D deficiency, unspecified: Secondary | ICD-10-CM | POA: Diagnosis not present

## 2017-09-20 DIAGNOSIS — I10 Essential (primary) hypertension: Secondary | ICD-10-CM | POA: Diagnosis not present

## 2017-09-20 DIAGNOSIS — M79604 Pain in right leg: Secondary | ICD-10-CM

## 2017-09-20 DIAGNOSIS — Z79890 Hormone replacement therapy (postmenopausal): Secondary | ICD-10-CM

## 2017-09-20 DIAGNOSIS — M25561 Pain in right knee: Secondary | ICD-10-CM

## 2017-09-20 DIAGNOSIS — K760 Fatty (change of) liver, not elsewhere classified: Secondary | ICD-10-CM

## 2017-09-20 DIAGNOSIS — Z0000 Encounter for general adult medical examination without abnormal findings: Secondary | ICD-10-CM

## 2017-09-20 NOTE — Assessment & Plan Note (Signed)
-  Patient with nasal congestion and dry cough over the last couple of days.  She denies any fevers or chills, no sinus pain or  congestion, no chest pain, no shortness of breath, no headaches, no myalgias -Patient advised to continue Nasonex and loratadine -She was advised to take over-the-counter Robitussin for cough -She was also encouraged to take warm showers with some steam inhalation -Patient also advised to use over-the-counter Nettie pot or saline nasal spray -Patient given reading material on a viral upper respiratory tract infection -She was encouraged to follow-up if her symptoms worsen or remain persistent despite conservative measures over the next week or 2

## 2017-09-20 NOTE — Assessment & Plan Note (Signed)
-  Patient states that she got her flu shot at her pharmacy -We will ask the pharmacy to fax over her records -We will consider goals of care discussion on her next visit

## 2017-09-20 NOTE — Assessment & Plan Note (Signed)
-  This problem is chronic and stable -Patient is compliant with levothyroxine 100 mcg daily -This was refilled yesterday and is available for pickup by the patient at the pharmacy -Patient denies any cold intolerance but does have some intermittent constipation which she says is normal for her. -We will check TSH today

## 2017-09-20 NOTE — Assessment & Plan Note (Signed)
BP Readings from Last 3 Encounters:  09/20/17 (!) 146/70  07/11/17 (!) 138/52  06/09/17 140/62    Lab Results  Component Value Date   NA 144 07/11/2017   K 3.9 07/11/2017   CREATININE 1.00 07/11/2017    Assessment: Blood pressure control:  Fair Progress toward BP goal:   Deteriorated Comments: Patient states that she is compliant with her medications (olmesartan/amlodipine/HCTZ 40/10/25 mg, metoprolol XL 100 mg)  Plan: Medications:  continue current medications Educational resources provided: brochure(denies need ) Self management tools provided:   Other plans: We will check BMP today

## 2017-09-20 NOTE — Assessment & Plan Note (Signed)
-  This problem is chronic and stable -Patient states that since the onset of her upper respiratory tract infection she has had to take her inhaler a little more frequently (approximately once a day) but normally she does not need to take it often -We will continue with albuterol as needed for now

## 2017-09-20 NOTE — Assessment & Plan Note (Signed)
-  Patient was noted to have vitamin D deficiency and was started on vitamin D on her last visit -We will check vitamin D levels today -Patient states that she has been compliant with his medication -No further workup at this time

## 2017-09-20 NOTE — Assessment & Plan Note (Signed)
-  This problem is chronic and stable -Patient was noted to have fatty liver on ultrasound in 2011 -Will check LFTs today -No further workup for now

## 2017-09-20 NOTE — Assessment & Plan Note (Signed)
-  Patient with mild intermittent right knee pain for which she uses a brace and Tylenol and states that this controls it -She has been prescribed Voltaren gel but does not use this.  She does use over-the-counter Aspercreme which she says works as well -Continue with Tylenol and Aspercreme as needed.  If pain is intermittently worse she can use Advil or Aleve occasionally but patient cautioned against excessive NSAID use

## 2017-09-20 NOTE — Progress Notes (Signed)
   Subjective:    Patient ID: Madison Jefferson, female    DOB: [birth year], 67 y.o.   MRN: [id number]  HPI  I have seen and examined this patient.  Patient is here for routine follow-up of her hypertension.  Patient states that she is compliant with all her medications.  She does complain of a recent upper respiratory tract infection.  States that she has had nasal congestion and some dry cough.  No fevers or chills.  She has had to use her albuterol inhaler a little more often during this.  Review of Systems  Constitutional: Negative.   HENT: Positive for congestion and rhinorrhea. Negative for ear discharge, ear pain, sinus pressure, sinus pain, sore throat, trouble swallowing and voice change.   Eyes: Negative.   Respiratory: Negative.   Cardiovascular: Negative.   Gastrointestinal: Negative.   Musculoskeletal: Positive for arthralgias.  Skin: Negative.   Neurological: Negative.   Psychiatric/Behavioral: Negative.        Objective:   Physical Exam  Constitutional: She is oriented to person, place, and time. She appears well-developed and well-nourished.  HENT:  Head: Normocephalic and atraumatic.  Mouth/Throat: Oropharynx is clear and moist. No oropharyngeal exudate.  Neck: Neck supple.  Cardiovascular: Normal rate, regular rhythm and normal heart sounds.  Pulmonary/Chest: Effort normal and breath sounds normal. No respiratory distress. She has no wheezes.  Abdominal: Soft. Bowel sounds are normal. She exhibits no distension. There is no tenderness.  Musculoskeletal: Normal range of motion. She exhibits no edema or tenderness.  Lymphadenopathy:    She has no cervical adenopathy.  Neurological: She is alert and oriented to person, place, and time.  Skin: Skin is warm. No rash noted. No erythema.  Psychiatric: She has a normal mood and affect. Her behavior is normal.          Assessment & Plan:  Please see problem based charting for assessment and plan:

## 2017-09-20 NOTE — Patient Instructions (Signed)
- It was a pleasure seeing you today -We will check some blood work today. -We have refilled your levothyroxine.  It is already ready at the pharmacy for you to pick up - you have a likely viral upper respiratory tract infection.  The symptoms will likely last for about a week or so.  If you have any worsening fevers or chills or muscle aches and pains or if your symptoms persist or get worse please follow-up in the clinic    Upper Respiratory Infection, Adult Most upper respiratory infections (URIs) are a viral infection of the air passages leading to the lungs. A URI affects the nose, throat, and upper air passages. The most common type of URI is nasopharyngitis and is typically referred to as "the common cold." URIs run their course and usually go away on their own. Most of the time, a URI does not require medical attention, but sometimes a bacterial infection in the upper airways can follow a viral infection. This is called a secondary infection. Sinus and middle ear infections are common types of secondary upper respiratory infections. Bacterial pneumonia can also complicate a URI. A URI can worsen asthma and chronic obstructive pulmonary disease (COPD). Sometimes, these complications can require emergency medical care and may be life threatening. What are the causes? Almost all URIs are caused by viruses. A virus is a type of germ and can spread from one person to another. What increases the risk? You may be at risk for a URI if:  You smoke.  You have chronic heart or lung disease.  You have a weakened defense (immune) system.  You are very young or very old.  You have nasal allergies or asthma.  You work in crowded or poorly ventilated areas.  You work in health care facilities or schools.  What are the signs or symptoms? Symptoms typically develop 2-3 days after you come in contact with a cold virus. Most viral URIs last 7-10 days. However, viral URIs from the influenza virus  (flu virus) can last 14-18 days and are typically more severe. Symptoms may include:  Runny or stuffy (congested) nose.  Sneezing.  Cough.  Sore throat.  Headache.  Fatigue.  Fever.  Loss of appetite.  Pain in your forehead, behind your eyes, and over your cheekbones (sinus pain).  Muscle aches.  How is this diagnosed? Your health care provider may diagnose a URI by:  Physical exam.  Tests to check that your symptoms are not due to another condition such as: ? Strep throat. ? Sinusitis. ? Pneumonia. ? Asthma.  How is this treated? A URI goes away on its own with time. It cannot be cured with medicines, but medicines may be prescribed or recommended to relieve symptoms. Medicines may help:  Reduce your fever.  Reduce your cough.  Relieve nasal congestion.  Follow these instructions at home:  Take medicines only as directed by your health care provider.  Gargle warm saltwater or take cough drops to comfort your throat as directed by your health care provider.  Use a warm mist humidifier or inhale steam from a shower to increase air moisture. This may make it easier to breathe.  Drink enough fluid to keep your urine clear or pale yellow.  Eat soups and other clear broths and maintain good nutrition.  Rest as needed.  Return to work when your temperature has returned to normal or as your health care provider advises. You may need to stay home longer to avoid infecting others.  You can also use a face mask and careful hand washing to prevent spread of the virus.  Increase the usage of your inhaler if you have asthma.  Do not use any tobacco products, including cigarettes, chewing tobacco, or electronic cigarettes. If you need help quitting, ask your health care provider. How is this prevented? The best way to protect yourself from getting a cold is to practice good hygiene.  Avoid oral or hand contact with people with cold symptoms.  Wash your hands often  if contact occurs.  There is no clear evidence that vitamin C, vitamin E, echinacea, or exercise reduces the chance of developing a cold. However, it is always recommended to get plenty of rest, exercise, and practice good nutrition. Contact a health care provider if:  You are getting worse rather than better.  Your symptoms are not controlled by medicine.  You have chills.  You have worsening shortness of breath.  You have brown or red mucus.  You have yellow or brown nasal discharge.  You have pain in your face, especially when you bend forward.  You have a fever.  You have swollen neck glands.  You have pain while swallowing.  You have white areas in the back of your throat. Get help right away if:  You have severe or persistent: ? Headache. ? Ear pain. ? Sinus pain. ? Chest pain.  You have chronic lung disease and any of the following: ? Wheezing. ? Prolonged cough. ? Coughing up blood. ? A change in your usual mucus.  You have a stiff neck.  You have changes in your: ? Vision. ? Hearing. ? Thinking. ? Mood. This information is not intended to replace advice given to you by your health care provider. Make sure you discuss any questions you have with your health care provider. Document Released: 01/26/2001 Document Revised: 04/04/2016 Document Reviewed: 11/07/2013 Elsevier Interactive Patient Education  [employer].

## 2017-09-21 ENCOUNTER — Encounter: Payer: Self-pay | Admitting: Internal Medicine

## 2017-09-21 LAB — CMP14 + ANION GAP
A/G RATIO: 1.5 (ref 1.2–2.2)
ALK PHOS: 52 IU/L (ref 39–117)
ALT: 23 IU/L (ref 0–32)
AST: 20 IU/L (ref 0–40)
Albumin: 4 g/dL (ref 3.6–4.8)
Anion Gap: 16 mmol/L (ref 10.0–18.0)
BUN/Creatinine Ratio: 16 (ref 12–28)
BUN: 15 mg/dL (ref 8–27)
Bilirubin Total: 0.8 mg/dL (ref 0.0–1.2)
CALCIUM: 9 mg/dL (ref 8.7–10.3)
CHLORIDE: 101 mmol/L (ref 96–106)
CO2: 24 mmol/L (ref 20–29)
Creatinine, Ser: 0.92 mg/dL (ref 0.57–1.00)
GFR calc Af Amer: 74 mL/min/{1.73_m2} (ref >59)
GFR, EST NON AFRICAN AMERICAN: 64 mL/min/{1.73_m2} (ref >59)
GLOBULIN, TOTAL: 2.6 g/dL (ref 1.5–4.5)
GLUCOSE: 112 mg/dL — AB (ref 65–99)
POTASSIUM: 3.9 mmol/L (ref 3.5–5.2)
SODIUM: 141 mmol/L (ref 134–144)
Total Protein: 6.6 g/dL (ref 6.0–8.5)

## 2017-09-21 LAB — TSH: TSH: 1.76 u[IU]/mL (ref 0.450–4.500)

## 2017-09-21 LAB — VITAMIN D 25 HYDROXY (VIT D DEFICIENCY, FRACTURES): VIT D 25 HYDROXY: 22.9 ng/mL — AB (ref 30.0–100.0)

## 2017-09-26 ENCOUNTER — Other Ambulatory Visit: Payer: Self-pay | Admitting: Internal Medicine

## 2017-09-26 NOTE — Telephone Encounter (Signed)
Last refill "No Print". Please re-send rx Thanks

## 2017-10-12 ENCOUNTER — Other Ambulatory Visit: Payer: Self-pay | Admitting: Internal Medicine

## 2017-10-12 DIAGNOSIS — I10 Essential (primary) hypertension: Secondary | ICD-10-CM

## 2017-10-31 ENCOUNTER — Other Ambulatory Visit: Payer: Self-pay | Admitting: Student in an Organized Health Care Education/Training Program

## 2017-12-16 ENCOUNTER — Other Ambulatory Visit: Payer: Self-pay | Admitting: Internal Medicine

## 2017-12-21 ENCOUNTER — Other Ambulatory Visit: Payer: Self-pay | Admitting: Internal Medicine

## 2017-12-29 ENCOUNTER — Other Ambulatory Visit: Payer: Self-pay | Admitting: Internal Medicine

## 2018-01-06 ENCOUNTER — Other Ambulatory Visit: Payer: Self-pay

## 2018-01-06 ENCOUNTER — Other Ambulatory Visit: Payer: Self-pay | Admitting: Internal Medicine

## 2018-01-06 ENCOUNTER — Ambulatory Visit (INDEPENDENT_AMBULATORY_CARE_PROVIDER_SITE_OTHER): Payer: Medicare Other | Admitting: Internal Medicine

## 2018-01-06 VITALS — BP 185/69 | HR 70 | Temp 98.1°F | Ht 61.0 in | Wt 194.8 lb

## 2018-01-06 DIAGNOSIS — Z791 Long term (current) use of non-steroidal anti-inflammatories (NSAID): Secondary | ICD-10-CM | POA: Diagnosis not present

## 2018-01-06 DIAGNOSIS — Z79899 Other long term (current) drug therapy: Secondary | ICD-10-CM

## 2018-01-06 DIAGNOSIS — X58XXXA Exposure to other specified factors, initial encounter: Secondary | ICD-10-CM | POA: Diagnosis not present

## 2018-01-06 DIAGNOSIS — S161XXA Strain of muscle, fascia and tendon at neck level, initial encounter: Secondary | ICD-10-CM

## 2018-01-06 DIAGNOSIS — M5030 Other cervical disc degeneration, unspecified cervical region: Secondary | ICD-10-CM | POA: Diagnosis not present

## 2018-01-06 MED ORDER — NAPROXEN 375 MG PO TABS: 375.0000 mg | ORAL_TABLET | Freq: Two times a day (BID) | ORAL | 0 refills | Status: DC

## 2018-01-06 MED ORDER — CYCLOBENZAPRINE HCL 5 MG PO TABS: 5.0000 mg | ORAL_TABLET | Freq: Three times a day (TID) | ORAL | 0 refills | Status: DC | PRN

## 2018-01-06 NOTE — Patient Instructions (Signed)
I suspect you have caused a muscle strain injury of your neck.  This can happen from trauma or even just postural things like an awkward sleeping position.  This is usually painful with slow improvement on its own and resolution within 6 weeks.  I recommend use of a anti-inflammatory medicine and muscle relaxant to relieve symptoms in the meantime.  You can take the anti-inflammatory as a scheduled medicine for 1-2 weeks then hopefully go down to less frequent use  I do not suspect a significant nerve or spine problem at this time.  Please give [place] a call back if you do start to notice numbness weakness trouble gripping things or walking is these would suggest that inflammation is causing a nerve problem.
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
## 2018-01-10 NOTE — Assessment & Plan Note (Signed)
She has severe neck pain consistent with cervical muscle strain.  There are no neurologic changes worrisome for new myelopathy.  Pain also does not radiate anywhere.  Her range of motion is limited by pain from passive and active movement indicating a significant level of spasticity.  This will likely take 4 to 6 weeks to fully recover but can start stretching and resuming normal range of motion after 2 weeks. Plan: Can continue soft neck brace as needed during the next week, she will need to try to resume range of motion stretches daily after 2 or 3 weeks Naproxen 375 mg twice daily for 1 to 2 weeks Cyclobenzaprine 5 mg every 8 hours as needed

## 2018-01-11 NOTE — Progress Notes (Signed)
Internal Medicine Clinic Attending  Case discussed with Dr. Rice at the time of the visit.  We reviewed the resident's history and exam and pertinent patient test results.  I agree with the assessment, diagnosis, and plan of care documented in the resident's note.  

## 2018-02-01 ENCOUNTER — Other Ambulatory Visit: Payer: Self-pay | Admitting: Internal Medicine

## 2018-03-29 ENCOUNTER — Other Ambulatory Visit: Payer: Self-pay | Admitting: Internal Medicine

## 2018-04-01 ENCOUNTER — Other Ambulatory Visit: Payer: Self-pay | Admitting: Internal Medicine

## 2018-04-17 ENCOUNTER — Other Ambulatory Visit: Payer: Self-pay | Admitting: Internal Medicine

## 2018-04-18 ENCOUNTER — Ambulatory Visit (INDEPENDENT_AMBULATORY_CARE_PROVIDER_SITE_OTHER): Payer: Medicare Other | Admitting: Internal Medicine

## 2018-04-18 ENCOUNTER — Encounter: Payer: Self-pay | Admitting: Internal Medicine

## 2018-04-18 ENCOUNTER — Other Ambulatory Visit: Payer: Self-pay

## 2018-04-18 VITALS — BP 137/49 | HR 71 | Temp 99.0°F | Ht 61.0 in | Wt 194.0 lb

## 2018-04-18 DIAGNOSIS — S161XXA Strain of muscle, fascia and tendon at neck level, initial encounter: Secondary | ICD-10-CM

## 2018-04-18 DIAGNOSIS — Z23 Encounter for immunization: Secondary | ICD-10-CM

## 2018-04-18 DIAGNOSIS — E559 Vitamin D deficiency, unspecified: Secondary | ICD-10-CM | POA: Diagnosis not present

## 2018-04-18 DIAGNOSIS — J301 Allergic rhinitis due to pollen: Secondary | ICD-10-CM

## 2018-04-18 DIAGNOSIS — R739 Hyperglycemia, unspecified: Secondary | ICD-10-CM

## 2018-04-18 DIAGNOSIS — E039 Hypothyroidism, unspecified: Secondary | ICD-10-CM

## 2018-04-18 DIAGNOSIS — M25561 Pain in right knee: Secondary | ICD-10-CM

## 2018-04-18 DIAGNOSIS — K760 Fatty (change of) liver, not elsewhere classified: Secondary | ICD-10-CM

## 2018-04-18 DIAGNOSIS — Z0000 Encounter for general adult medical examination without abnormal findings: Secondary | ICD-10-CM

## 2018-04-18 DIAGNOSIS — Z79890 Hormone replacement therapy (postmenopausal): Secondary | ICD-10-CM

## 2018-04-18 DIAGNOSIS — I10 Essential (primary) hypertension: Secondary | ICD-10-CM | POA: Diagnosis not present

## 2018-04-18 DIAGNOSIS — J309 Allergic rhinitis, unspecified: Secondary | ICD-10-CM

## 2018-04-18 DIAGNOSIS — Z79899 Other long term (current) drug therapy: Secondary | ICD-10-CM

## 2018-04-18 DIAGNOSIS — Z6836 Body mass index (BMI) 36.0-36.9, adult: Secondary | ICD-10-CM

## 2018-04-18 DIAGNOSIS — E669 Obesity, unspecified: Secondary | ICD-10-CM

## 2018-04-18 DIAGNOSIS — G8929 Other chronic pain: Secondary | ICD-10-CM

## 2018-04-18 DIAGNOSIS — Z8739 Personal history of other diseases of the musculoskeletal system and connective tissue: Secondary | ICD-10-CM

## 2018-04-18 LAB — GLUCOSE, CAPILLARY: Glucose-Capillary: 166 mg/dL — ABNORMAL HIGH (ref 70–99)

## 2018-04-18 LAB — POCT GLYCOSYLATED HEMOGLOBIN (HGB A1C): Hemoglobin A1C: 5.6 % (ref 4.0–5.6)

## 2018-04-18 MED ORDER — LORATADINE 10 MG PO TABS: ORAL_TABLET | ORAL | 1 refills | Status: DC

## 2018-04-18 MED ORDER — OLMESARTAN-AMLODIPINE-HCTZ 40-10-25 MG PO TABS: 1.0000 | ORAL_TABLET | Freq: Every day | ORAL | 1 refills | Status: DC

## 2018-04-18 NOTE — Progress Notes (Signed)
   Subjective:    Patient ID: Madison Jefferson, female    DOB: 1951-02-04, 67 y.o.   MRN: [id number]  HPI  I have seen and examined this patient.  Patient feels well today.  She does state that she is been trying to lose weight by exercising and following a diet but that she gets some recurrent right knee pain when she exercises.  She denies any other complaints at this time and states that she is compliant with all her medications.   Review of Systems  Constitutional: Negative.   HENT: Negative.   Respiratory: Negative.   Cardiovascular: Negative.   Gastrointestinal: Negative.   Musculoskeletal: Positive for arthralgias. Negative for back pain and myalgias.  Neurological: Negative.   Psychiatric/Behavioral: Negative.        Objective:   Physical Exam  Constitutional: She is oriented to person, place, and time. She appears well-developed and well-nourished.  HENT:  Head: Normocephalic and atraumatic.  Mouth/Throat: No oropharyngeal exudate.  Neck: Neck supple.  Cardiovascular: Normal rate, regular rhythm and normal heart sounds.  Pulmonary/Chest: Effort normal and breath sounds normal. She has no wheezes. She has no rales.  Abdominal: Soft. Bowel sounds are normal. She exhibits no distension. There is no tenderness.  Musculoskeletal: Normal range of motion. She exhibits no edema.  Lymphadenopathy:    She has no cervical adenopathy.  Neurological: She is alert and oriented to person, place, and time.  Psychiatric: She has a normal mood and affect. Her behavior is normal.          Assessment & Plan:  Please see problem based charting for assessment and plan:

## 2018-04-18 NOTE — Assessment & Plan Note (Signed)
-  We will give the patient a flu shot today

## 2018-04-18 NOTE — Patient Instructions (Signed)
-  It was a pleasure seeing you today -Please follow-up with me in 6 months -We will check some blood work on you today -We will check a repeat sonogram of your liver -We will give you a flu shot today -Please call me if you have any questions

## 2018-04-18 NOTE — Assessment & Plan Note (Signed)
-  This problem is chronic and stable -Patient states that she has been trying to exercise and watch her diet -Her weight has remained stable -I encouraged the patient to continue to try to do this and we will follow-up with her on her next visit

## 2018-04-18 NOTE — Assessment & Plan Note (Signed)
-  Patient states that she has been exercising recently and has been having some worsening right knee pain when she does this -I asked the patient try some over-the-counter creams like Aspercreme or BenGay to see if this would help her and if it does not we will consider starting her on Voltaren gel -Patient expresses understanding and is reviewed plan -No further work-up at this time

## 2018-04-18 NOTE — Assessment & Plan Note (Signed)
-  This problem is chronic and stable -Patient states that she is compliant with her vitamin D supplements -We will recheck vitamin D level today

## 2018-04-18 NOTE — Assessment & Plan Note (Signed)
-  This problem is chronic and stable -Patient states she takes loratadine as needed for this -She denies any other complaints at this time -We will refill this medication for her today

## 2018-04-18 NOTE — Assessment & Plan Note (Signed)
-  Patient presented to the internal medicine clinic in May with a cervical muscle strain -She was prescribed naproxen and cyclobenzaprine for this -Patient states that her pain had resolved and she is no longer taking these medications -We will remove these medications from her list today and resolve this problem

## 2018-04-18 NOTE — Assessment & Plan Note (Addendum)
BP Readings from Last 3 Encounters:  04/18/18 (!) 137/49  01/06/18 (!) 185/69  09/20/17 (!) 146/70    Lab Results  Component Value Date   NA 141 09/20/2017   K 3.9 09/20/2017   CREATININE 0.92 09/20/2017    Assessment: Blood pressure control:  Well-controlled Progress toward BP goal:   At goal Comments: Patient states that she is compliant with olmesartan/amlodipine/HCTZ 40/10/20 5 mg and metoprolol succinate 100 mg  Plan: Medications:  continue current medications Educational resources provided:   Self management tools provided:   Other plans:

## 2018-04-18 NOTE — Assessment & Plan Note (Signed)
-  This problem is chronic and stable -Patient denies any fatigue, abdominal pain or nausea or vomiting, abdominal distention, lower extremity swelling -Patient did have LFTs done earlier this year which were within normal limits -Her last abdominal ultrasound was done in 2011.  I will order repeat right upper quadrant ultrasound today -Explained to the patient that weight loss is extremely important in patients who have fatty liver and she states that she will try to lose weight

## 2018-04-18 NOTE — Assessment & Plan Note (Signed)
-  This problem is chronic and stable -Patient states that she is compliant with her levothyroxine 100 mcg daily -I have refilled this for her recently -Her last TSH in February was within normal limits -We will recheck her TSH today

## 2018-04-18 NOTE — Assessment & Plan Note (Signed)
-  Patient has been noted to have some episodes of hypoglycemia with blood sugars in the 100-125 range -We will check an A1c today -No further work-up at this time

## 2018-04-19 ENCOUNTER — Telehealth: Payer: Self-pay | Admitting: Internal Medicine

## 2018-04-19 LAB — TSH: TSH: 0.683 u[IU]/mL (ref 0.450–4.500)

## 2018-04-19 LAB — VITAMIN D 25 HYDROXY (VIT D DEFICIENCY, FRACTURES): Vit D, 25-Hydroxy: 26.3 ng/mL — ABNORMAL LOW (ref 30.0–100.0)

## 2018-04-19 NOTE — Telephone Encounter (Signed)
I called the patient to discuss results of her blood work with her.  Patient states that she feels well today.  Her TSH is within normal her vitamin D level is still below normal but has improved from last time.  She was encouraged to continue using her vitamin D supplements.  No further work-up at this time.  Patient expresses understanding and is in agreement with plan.

## 2018-05-09 ENCOUNTER — Ambulatory Visit (HOSPITAL_COMMUNITY): Payer: Medicare Other

## 2018-05-11 ENCOUNTER — Other Ambulatory Visit: Payer: Self-pay | Admitting: Internal Medicine

## 2018-06-26 ENCOUNTER — Other Ambulatory Visit: Payer: Self-pay | Admitting: Internal Medicine

## 2018-08-14 ENCOUNTER — Other Ambulatory Visit: Payer: Self-pay | Admitting: Internal Medicine

## 2018-08-29 ENCOUNTER — Other Ambulatory Visit: Payer: Self-pay | Admitting: Internal Medicine

## 2018-08-29 DIAGNOSIS — Z1231 Encounter for screening mammogram for malignant neoplasm of breast: Secondary | ICD-10-CM

## 2018-09-08 ENCOUNTER — Other Ambulatory Visit: Payer: Self-pay | Admitting: Internal Medicine

## 2018-09-25 ENCOUNTER — Ambulatory Visit
Admission: RE | Admit: 2018-09-25 | Discharge: 2018-09-25 | Disposition: A | Discharge status: Home / Self Care | Payer: Medicare Other | Source: Ambulatory Visit | Attending: Internal Medicine | Admitting: Internal Medicine

## 2018-09-25 DIAGNOSIS — Z1231 Encounter for screening mammogram for malignant neoplasm of breast: Secondary | ICD-10-CM

## 2018-10-25 ENCOUNTER — Other Ambulatory Visit: Payer: Self-pay | Admitting: Internal Medicine

## 2018-10-25 DIAGNOSIS — I10 Essential (primary) hypertension: Secondary | ICD-10-CM

## 2018-10-25 NOTE — Telephone Encounter (Signed)
Next appt scheduled 4/21 with PCP. ?

## 2018-12-05 ENCOUNTER — Encounter: Payer: Medicare Other | Admitting: Internal Medicine

## 2018-12-16 ENCOUNTER — Other Ambulatory Visit: Payer: Self-pay | Admitting: Internal Medicine

## 2018-12-16 DIAGNOSIS — J45909 Unspecified asthma, uncomplicated: Secondary | ICD-10-CM

## 2018-12-16 DIAGNOSIS — J301 Allergic rhinitis due to pollen: Secondary | ICD-10-CM

## 2019-03-27 ENCOUNTER — Other Ambulatory Visit: Payer: Self-pay | Admitting: Internal Medicine

## 2019-03-27 DIAGNOSIS — J301 Allergic rhinitis due to pollen: Secondary | ICD-10-CM

## 2019-04-23 ENCOUNTER — Other Ambulatory Visit: Payer: Self-pay | Admitting: Internal Medicine

## 2019-04-23 DIAGNOSIS — I10 Essential (primary) hypertension: Secondary | ICD-10-CM

## 2019-04-24 ENCOUNTER — Ambulatory Visit (INDEPENDENT_AMBULATORY_CARE_PROVIDER_SITE_OTHER): Payer: Medicare Other | Admitting: Internal Medicine

## 2019-04-24 ENCOUNTER — Other Ambulatory Visit: Payer: Self-pay

## 2019-04-24 ENCOUNTER — Ambulatory Visit (HOSPITAL_COMMUNITY)
Admission: RE | Admit: 2019-04-24 | Discharge: 2019-04-24 | Disposition: A | Discharge status: Home / Self Care | Payer: Medicare Other | Source: Ambulatory Visit | Attending: Internal Medicine | Admitting: Internal Medicine

## 2019-04-24 VITALS — BP 150/62 | HR 62 | Temp 99.1°F | Ht 61.0 in | Wt 191.6 lb

## 2019-04-24 DIAGNOSIS — M25561 Pain in right knee: Secondary | ICD-10-CM | POA: Insufficient documentation

## 2019-04-24 DIAGNOSIS — G8929 Other chronic pain: Secondary | ICD-10-CM

## 2019-04-24 DIAGNOSIS — E039 Hypothyroidism, unspecified: Secondary | ICD-10-CM | POA: Diagnosis not present

## 2019-04-24 DIAGNOSIS — I10 Essential (primary) hypertension: Secondary | ICD-10-CM

## 2019-04-24 DIAGNOSIS — Z6836 Body mass index (BMI) 36.0-36.9, adult: Secondary | ICD-10-CM

## 2019-04-24 DIAGNOSIS — R739 Hyperglycemia, unspecified: Secondary | ICD-10-CM | POA: Diagnosis not present

## 2019-04-24 DIAGNOSIS — Z79899 Other long term (current) drug therapy: Secondary | ICD-10-CM

## 2019-04-24 DIAGNOSIS — K760 Fatty (change of) liver, not elsewhere classified: Secondary | ICD-10-CM | POA: Diagnosis not present

## 2019-04-24 DIAGNOSIS — E559 Vitamin D deficiency, unspecified: Secondary | ICD-10-CM | POA: Diagnosis not present

## 2019-04-24 DIAGNOSIS — Z79890 Hormone replacement therapy (postmenopausal): Secondary | ICD-10-CM

## 2019-04-24 DIAGNOSIS — Z0000 Encounter for general adult medical examination without abnormal findings: Secondary | ICD-10-CM

## 2019-04-24 DIAGNOSIS — E669 Obesity, unspecified: Secondary | ICD-10-CM

## 2019-04-24 LAB — POCT GLYCOSYLATED HEMOGLOBIN (HGB A1C): Hemoglobin A1C: 6.1 % — AB (ref 4.0–5.6)

## 2019-04-24 LAB — GLUCOSE, CAPILLARY: Glucose-Capillary: 176 mg/dL — ABNORMAL HIGH (ref 70–99)

## 2019-04-24 MED ORDER — METOPROLOL SUCCINATE ER 100 MG PO TB24: ORAL_TABLET | ORAL | 0 refills | Status: DC

## 2019-04-24 MED ORDER — LEVOTHYROXINE SODIUM 100 MCG PO TABS: 100.0000 ug | ORAL_TABLET | Freq: Every day | ORAL | 2 refills | Status: DC

## 2019-04-24 MED ORDER — NAPROXEN 500 MG PO TABS: 500.0000 mg | ORAL_TABLET | Freq: Two times a day (BID) | ORAL | 0 refills | Status: AC | PRN

## 2019-04-24 MED ORDER — OLMESARTAN-AMLODIPINE-HCTZ 40-10-25 MG PO TABS: 1.0000 | ORAL_TABLET | Freq: Every day | ORAL | 1 refills | Status: DC

## 2019-04-24 NOTE — Assessment & Plan Note (Addendum)
-  This problem is chronic and stable -Patient is compliant with vitamin D supplements -We will recheck a vitamin D level today  Addendum: -Patient vitamin D level remains slightly low (at 25.9) -We will continue vitamin D supplementation and monitor

## 2019-04-24 NOTE — Assessment & Plan Note (Addendum)
-  This problem is chronic and stable -Patient denies any symptoms of hypothyroidism currently and is compliant with levothyroxine 100 mcg daily -I have refilled his medication for today -She was noted to have mild lower extremity nonpitting edema on exam today which could be from hypothyroidism -We will recheck her TSH today -No further work-up at this time  Addendum: -Patient's TSH was within normal limits (1.5) on blood work -We will continue with current dose of Synthroid -No further work-up at this time

## 2019-04-24 NOTE — Assessment & Plan Note (Addendum)
BP Readings from Last 3 Encounters:  04/24/19 (!) 150/62  04/18/18 (!) 137/49  01/06/18 (!) 185/69    Lab Results  Component Value Date   NA 141 09/20/2017   K 3.9 09/20/2017   CREATININE 0.92 09/20/2017    Assessment: Blood pressure control:  Fair Progress toward BP goal:   Deteriorated Comments: Patient states that she takes her olmesartan/amlodipine/HCTZ 40/10/25 mg in the morning but takes her metoprolol XL 100 mg only in the afternoons and thus has not taken that medication yet today  Plan: Medications:  continue current medications Educational resources provided:   Self management tools provided:   Other plans: We will check BMP today  Addendum: -Patient noted to have a mildly elevated creatinine of 1.04.  No further work-up at this time but will need repeat BMP on her follow-up visit.  If her creatinine continues to worsen will consider holding her olmesartan and HCTZ and starting her on alternative blood pressure medications

## 2019-04-24 NOTE — Assessment & Plan Note (Addendum)
-  This problem is chronic and stable -Patient states that her right knee does occasionally swell sometimes and that she does get pain in her right knee especially when standing all day at work -No tenderness to palpation and no joint swelling or erythema noted -We will get an x-ray of her right knee today -Patient to continue with Aspercreme for her pain as well as naproxen as needed -If patient has worsening pain despite these measures she may benefit from a joint injection -No further work-up at this time  Addendum: -Patient's x-ray of her right knee showed mild degenerative changes -We will continue with pain control for now -No further work-up at this time

## 2019-04-24 NOTE — Assessment & Plan Note (Signed)
-  Patient's last A1c was 5.6 done last year -Given that she has had some mildly elevated blood sugars in the past we will recheck her A1c again today -If this is within normal limits we will stop checking A1c's

## 2019-04-24 NOTE — Patient Instructions (Signed)
-  It was a pleasure seeing you today -We will check some blood work on you today -I will give you a call and let you know if we need to get imaging of your liver based on her blood work -We will get an x-ray of your right knee today -I will put in refills of your medication for you today -Please call me if you have any questions or concerns

## 2019-04-24 NOTE — Assessment & Plan Note (Addendum)
-  This problem is chronic and stable -We will check repeat LFTs today -Patient's weight is down approximately 3 pounds since her last visit last year.  However, she will need to lose approximately 10% of her body weight for it to be beneficial and fatty liver -We will refer patient to [facility] for weight loss -We will also check a CBC, CMP today and calculate a fibrosis score.  If this score is elevated will consider checking an elastography to check for scarring of her liver -No further work-up at this time  Addendum: -Patient was noted to have a fibrosis score of 1.29 which is not consistent with advanced fibrosis.  We will not do further imaging at this time

## 2019-04-24 NOTE — Assessment & Plan Note (Signed)
-  Patient offered a flu shot today but states that she will get it at her next visit and does not want to take this today

## 2019-04-24 NOTE — Assessment & Plan Note (Signed)
-  This problem is chronic and stable -Patient is down approximately 3 pounds since her visit last year -Expressed to the patient the importance of her losing weight especially with regards to her fatty liver -She stresses understanding of this and was agreeable to meet with [facility] to help with her diet -No further work-up at this time.  We will continue to monitor

## 2019-04-24 NOTE — Progress Notes (Signed)
   Subjective:    Patient ID: Madison Jefferson, female    DOB: 1950-08-19, 68 y.o.   MRN: [id number]  HPI I have seen and examined this patient.  Patient is here for routine follow-up of her hypertension and hypothyroidism.  Patient does complain of intermittent pain in her right knee which has been chronic in nature.  Patient also complains of intermittent swelling in her feet and states that this is likely from being on her feet all day at work.  Patient denies any other complaints currently and states that she is compliant with all her medications.   Review of Systems  Constitutional: Negative.   HENT: Negative.   Respiratory: Negative.   Cardiovascular: Positive for leg swelling.       Patient complains of intermittent lower extremity swelling especially after being on her feet all day  Gastrointestinal: Negative.   Musculoskeletal: Positive for arthralgias. Negative for gait problem, joint swelling and myalgias.       Patient with intermittent right knee pain  Neurological: Negative.   Psychiatric/Behavioral: Negative.        Objective:   Physical Exam Constitutional:      Appearance: Normal appearance.  HENT:     Head: Normocephalic and atraumatic.  Cardiovascular:     Rate and Rhythm: Normal rate and regular rhythm.     Heart sounds: Normal heart sounds.  Pulmonary:     Breath sounds: Normal breath sounds. No wheezing or rales.  Abdominal:     General: Abdomen is flat. Bowel sounds are normal.     Palpations: Abdomen is soft.     Tenderness: There is no abdominal tenderness. There is no guarding.  Musculoskeletal:        General: No tenderness.     Comments: Trace bilateral lower extremity nonpitting edema noted  No swelling or increased warmth or tenderness on palpation of patient's right knee  Neurological:     General: No focal deficit present.     Mental Status: She is alert and oriented to person, place, and time.  Psychiatric:        Mood and Affect: Mood  normal.        Behavior: Behavior normal.           Assessment & Plan:  Please see problem based charting for assessment and plan:

## 2019-04-25 ENCOUNTER — Encounter: Payer: Self-pay | Admitting: Internal Medicine

## 2019-04-25 LAB — CMP14 + ANION GAP
ALT: 22 IU/L (ref 0–32)
AST: 17 IU/L (ref 0–40)
Albumin/Globulin Ratio: 1.4 (ref 1.2–2.2)
Albumin: 4 g/dL (ref 3.8–4.8)
Alkaline Phosphatase: 46 IU/L (ref 39–117)
Anion Gap: 17 mmol/L (ref 10.0–18.0)
BUN/Creatinine Ratio: 14 (ref 12–28)
BUN: 15 mg/dL (ref 8–27)
Bilirubin Total: 0.9 mg/dL (ref 0.0–1.2)
CO2: 23 mmol/L (ref 20–29)
Calcium: 8.9 mg/dL (ref 8.7–10.3)
Chloride: 98 mmol/L (ref 96–106)
Creatinine, Ser: 1.04 mg/dL — ABNORMAL HIGH (ref 0.57–1.00)
GFR calc Af Amer: 63 mL/min/{1.73_m2} (ref >59)
GFR calc non Af Amer: 55 mL/min/{1.73_m2} — ABNORMAL LOW (ref >59)
Globulin, Total: 2.8 g/dL (ref 1.5–4.5)
Glucose: 164 mg/dL — ABNORMAL HIGH (ref 65–99)
Potassium: 3.6 mmol/L (ref 3.5–5.2)
Sodium: 138 mmol/L (ref 134–144)
Total Protein: 6.8 g/dL (ref 6.0–8.5)

## 2019-04-25 LAB — CBC WITH DIFFERENTIAL/PLATELET
Basophils Absolute: 0.1 10*3/uL (ref 0.0–0.2)
Basos: 1 %
EOS (ABSOLUTE): 0.1 10*3/uL (ref 0.0–0.4)
Eos: 2 %
Hematocrit: 38.1 % (ref 34.0–46.6)
Hemoglobin: 12.5 g/dL (ref 11.1–15.9)
Immature Grans (Abs): 0 10*3/uL (ref 0.0–0.1)
Immature Granulocytes: 0 %
Lymphocytes Absolute: 1.4 10*3/uL (ref 0.7–3.1)
Lymphs: 24 %
MCH: 27.7 pg (ref 26.6–33.0)
MCHC: 32.8 g/dL (ref 31.5–35.7)
MCV: 85 fL (ref 79–97)
Monocytes Absolute: 0.4 10*3/uL (ref 0.1–0.9)
Monocytes: 7 %
Neutrophils Absolute: 3.9 10*3/uL (ref 1.4–7.0)
Neutrophils: 66 %
Platelets: 196 10*3/uL (ref 150–450)
RBC: 4.51 x10E6/uL (ref 3.77–5.28)
RDW: 12.9 % (ref 11.7–15.4)
WBC: 5.9 10*3/uL (ref 3.4–10.8)

## 2019-04-25 LAB — VITAMIN D 25 HYDROXY (VIT D DEFICIENCY, FRACTURES): Vit D, 25-Hydroxy: 25.9 ng/mL — ABNORMAL LOW (ref 30.0–100.0)

## 2019-04-25 LAB — TSH: TSH: 1.51 u[IU]/mL (ref 0.450–4.500)

## 2019-04-26 ENCOUNTER — Encounter: Payer: Self-pay | Admitting: Internal Medicine

## 2019-05-07 ENCOUNTER — Other Ambulatory Visit: Payer: Self-pay

## 2019-05-07 ENCOUNTER — Ambulatory Visit (INDEPENDENT_AMBULATORY_CARE_PROVIDER_SITE_OTHER): Payer: Medicare Other | Admitting: Dietician

## 2019-05-07 DIAGNOSIS — Z713 Dietary counseling and surveillance: Secondary | ICD-10-CM | POA: Diagnosis not present

## 2019-05-07 DIAGNOSIS — Z6836 Body mass index (BMI) 36.0-36.9, adult: Secondary | ICD-10-CM | POA: Diagnosis not present

## 2019-05-07 NOTE — Patient Instructions (Addendum)
Nice to meet you today!  I will request a referral to the Medicare Diabetes Prevention Program at the [facility]. Alric Quan is in charge. their phone umber is [phone]  Of course you can call me with any question or concerns.  [facility] [phone]   Here is a summary of foods to try to add and foods to try to keep at a minimum:   Eat more... 1. Whole grains: whole wheat cereal, crackers, bread, pasta, old fashioned oats & brown rice, quinoa,bulgur,spelt, etc.. 2. Whole fruits-1-2 cups a day 3. Minimally processed Vegetables- 2-3 cups a day 4. Lowfat Protein: Chicken, [place], lean cuts of beef and pork, fish 2-3 times a week, tofu, tempeh, beans, go meatless at least one time per week 5. Nuts, Seeds and Beans- :add walnuts to cereal, peanut butter sandwich; beans instead of meat 6- Healthy Fats- nuts, seeds, chia seeds, flax seeds, avocados, olives, peanut butter,  almond butter...  Eat Less... 1. Saturated & Trans fats- limit sat fat to 15 grams per day, best for zero trans fats (found mostly in red meat, high fat dairy like milk, ice cream, coffee creamer, snack foods like chips, pork rind, fried foods) 2. Added Sugar: limit to 6 teaspoons added sugar per day 3. Sodium:Use spices more often

## 2019-05-07 NOTE — Progress Notes (Signed)
Intensive Behavioral Therapy for Obesity ( visit # ) :  1. Estimated body mass index is 36.2 kg/m as calculated from the following:   Height as of 04/24/19: 5\' 1"  (1.549 m).   Weight as of 04/24/19: 191 lb 9.6 oz (86.9 kg).    Wt Readings from Last 5 Encounters:  04/24/19 191 lb 9.6 oz (86.9 kg)  04/18/18 194 lb (88 kg)  01/06/18 194 lb 12.8 oz (88.4 kg)  09/20/17 197 lb 4.8 oz (89.5 kg)  07/11/17 194 lb 11.2 oz (88.3 kg)    2. Dietary (nutritional) assessment; Has cut back on sugar, chips, sausage and bacon intake and is trying to include more fruits. She has a good meal pattern: Eats 3 meals a day spaced 4-5 hours with 0-1 snacks. Sleep sounds adequate.   3. Intensive behavioral counseling and behavioral therapy to promote sustained  weight loss 1. Assessment of behavioral health risks and factors affecting choice of behavior goals: starting anew job, but change she is making is expected to improve her schedule so she has more time for herself 2. Advise provided today:  importance of balanced meals and increasing vegetables, fruits and whole grain (potassium) intake 3. Collaboratively set goals: eat different snacks, more vegetables and whole grains 4. Assist: meal plannig for foods to include when hungry between meals 5. Arrange follow up:as needed with me; she agreed to try to [facility] Prevention [facility] Debera Lat, RD 05/07/2019 4:18 PM.

## 2019-05-08 ENCOUNTER — Other Ambulatory Visit: Payer: Self-pay | Admitting: Dietician

## 2019-05-08 DIAGNOSIS — R739 Hyperglycemia, unspecified: Secondary | ICD-10-CM

## 2019-05-08 NOTE — Progress Notes (Signed)
Request referral per patient.

## 2019-05-24 ENCOUNTER — Encounter: Payer: Self-pay | Admitting: *Deleted

## 2019-07-20 ENCOUNTER — Other Ambulatory Visit: Payer: Self-pay | Admitting: Internal Medicine

## 2019-07-20 DIAGNOSIS — I10 Essential (primary) hypertension: Secondary | ICD-10-CM

## 2019-09-06 ENCOUNTER — Other Ambulatory Visit: Payer: Self-pay | Admitting: Internal Medicine

## 2019-09-06 DIAGNOSIS — Z1231 Encounter for screening mammogram for malignant neoplasm of breast: Secondary | ICD-10-CM

## 2019-10-12 ENCOUNTER — Other Ambulatory Visit: Payer: Self-pay

## 2019-10-12 ENCOUNTER — Ambulatory Visit
Admission: RE | Admit: 2019-10-12 | Discharge: 2019-10-12 | Disposition: A | Discharge status: Home / Self Care | Payer: Medicare Other | Source: Ambulatory Visit | Attending: Internal Medicine | Admitting: Internal Medicine

## 2019-10-12 DIAGNOSIS — Z1231 Encounter for screening mammogram for malignant neoplasm of breast: Secondary | ICD-10-CM

## 2019-10-14 ENCOUNTER — Ambulatory Visit: Payer: Medicare Other | Attending: Internal Medicine

## 2019-10-14 DIAGNOSIS — Z23 Encounter for immunization: Secondary | ICD-10-CM

## 2019-10-14 NOTE — Progress Notes (Signed)
   Covid-19 Vaccination Clinic  Name:  Madison Jefferson    MRN: [id number] DOB: 09-Dec-1950  10/14/2019  [name] was observed post Covid-19 immunization for 15 minutes without incidence. She was provided with Vaccine Information Sheet and instruction to access the V-Safe system.   [name] was instructed to call 911 with any severe reactions post vaccine: [name] Difficulty breathing  . Swelling of your face and throat  . A fast heartbeat  . A bad rash all over your body  . Dizziness and weakness    Immunizations Administered    Name Date Dose VIS Date Route   Pfizer COVID-19 Vaccine 10/14/2019  8:36 AM 0.3 mL 07/27/2019 Intramuscular   Manufacturer: [place]   Lot: [id number]   [place]: [id number]

## 2019-10-16 ENCOUNTER — Ambulatory Visit (INDEPENDENT_AMBULATORY_CARE_PROVIDER_SITE_OTHER): Payer: Medicare Other | Admitting: Internal Medicine

## 2019-10-16 ENCOUNTER — Encounter: Payer: Self-pay | Admitting: Internal Medicine

## 2019-10-16 VITALS — BP 160/62 | HR 67 | Temp 98.0°F | Ht 61.0 in | Wt 196.0 lb

## 2019-10-16 DIAGNOSIS — R739 Hyperglycemia, unspecified: Secondary | ICD-10-CM

## 2019-10-16 DIAGNOSIS — E559 Vitamin D deficiency, unspecified: Secondary | ICD-10-CM | POA: Diagnosis not present

## 2019-10-16 DIAGNOSIS — K760 Fatty (change of) liver, not elsewhere classified: Secondary | ICD-10-CM

## 2019-10-16 DIAGNOSIS — E039 Hypothyroidism, unspecified: Secondary | ICD-10-CM

## 2019-10-16 DIAGNOSIS — I10 Essential (primary) hypertension: Secondary | ICD-10-CM | POA: Diagnosis not present

## 2019-10-16 DIAGNOSIS — Z0000 Encounter for general adult medical examination without abnormal findings: Secondary | ICD-10-CM

## 2019-10-16 DIAGNOSIS — J45909 Unspecified asthma, uncomplicated: Secondary | ICD-10-CM

## 2019-10-16 DIAGNOSIS — E669 Obesity, unspecified: Secondary | ICD-10-CM

## 2019-10-16 DIAGNOSIS — Z79890 Hormone replacement therapy (postmenopausal): Secondary | ICD-10-CM

## 2019-10-16 DIAGNOSIS — Z6837 Body mass index (BMI) 37.0-37.9, adult: Secondary | ICD-10-CM

## 2019-10-16 DIAGNOSIS — M25561 Pain in right knee: Secondary | ICD-10-CM

## 2019-10-16 DIAGNOSIS — J301 Allergic rhinitis due to pollen: Secondary | ICD-10-CM

## 2019-10-16 DIAGNOSIS — G8929 Other chronic pain: Secondary | ICD-10-CM

## 2019-10-16 DIAGNOSIS — Z79899 Other long term (current) drug therapy: Secondary | ICD-10-CM

## 2019-10-16 LAB — POCT GLYCOSYLATED HEMOGLOBIN (HGB A1C): Hemoglobin A1C: 5.9 % — AB (ref 4.0–5.6)

## 2019-10-16 LAB — GLUCOSE, CAPILLARY: Glucose-Capillary: 226 mg/dL — ABNORMAL HIGH (ref 70–99)

## 2019-10-16 MED ORDER — LEVOTHYROXINE SODIUM 100 MCG PO TABS: 100.0000 ug | ORAL_TABLET | Freq: Every day | ORAL | 2 refills | Status: DC

## 2019-10-16 MED ORDER — METOPROLOL SUCCINATE ER 100 MG PO TB24: ORAL_TABLET | ORAL | 0 refills | Status: DC

## 2019-10-16 MED ORDER — MOMETASONE FUROATE 50 MCG/ACT NA SUSP: 2.0000 | Freq: Every day | NASAL | 1 refills | Status: DC

## 2019-10-16 MED ORDER — OLMESARTAN-AMLODIPINE-HCTZ 40-10-25 MG PO TABS: 1.0000 | ORAL_TABLET | Freq: Every day | ORAL | 1 refills | Status: DC

## 2019-10-16 MED ORDER — VITAMIN D3 10 MCG (400 UNIT) PO CAPS: 800.0000 [IU] | ORAL_CAPSULE | Freq: Every day | ORAL | 1 refills | Status: DC

## 2019-10-16 MED ORDER — LORATADINE 10 MG PO TABS: 10.0000 mg | ORAL_TABLET | Freq: Every day | ORAL | 1 refills | Status: DC

## 2019-10-16 MED ORDER — ASPIRIN 81 MG PO TBEC: DELAYED_RELEASE_TABLET | ORAL | 1 refills | Status: DC

## 2019-10-16 NOTE — Assessment & Plan Note (Signed)
BP Readings from Last 3 Encounters:  10/16/19 (!) 160/62  04/24/19 (!) 150/62  04/18/18 (!) 137/49    Lab Results  Component Value Date   NA 138 04/24/2019   K 3.6 04/24/2019   CREATININE 1.04 (H) 04/24/2019    Assessment: Blood pressure control:  Uncontrolled Progress toward BP goal:   Deteriorated Comments: Patient states that she is compliant with olmesartan/amlodipine/HCTZ 40/10/25 mg as well as Toprol-XL 100 mg daily  Plan: Medications:  continue current medications Educational resources provided:   Self management tools provided:   Other plans: Patient states that her blood pressures at home have been better controlled with SBP's in the 130s to 140s.  We will not change her medications at this time but if her blood pressure mains elevated at her follow-up visit we will consider changing her blood pressure medication

## 2019-10-16 NOTE — Assessment & Plan Note (Signed)
-  This problem is chronic and stable -Patient states that she has occasional episodes of pain in her right knee which are improved with Voltaren gel or Aspercreme.  Patient states that the pain is severe she takes a couple of aspirin as well -Patient's pain is well controlled on current regimen -No further work-up at this time

## 2019-10-16 NOTE — Assessment & Plan Note (Signed)
-  This problem is chronic and stable -Patient states that she followed up with [facility] and is working on her diet. -Patient has also been trying to exercise and go for walks when the weather is warmer -She has a goal weight loss of 5 pounds by her next visit -No further work-up at this time.  We will continue to monitor closely

## 2019-10-16 NOTE — Assessment & Plan Note (Signed)
-  This problem is chronic and stable -Patient is compliant with vitamin D supplements -We will recheck vitamin D level today

## 2019-10-16 NOTE — Patient Instructions (Signed)
-  It was a pleasure seeing you today. -We will check some blood work on you today -Your blood pressure is elevated today.  We will recheck this for you today.  If it remains elevated at your next visit we will consider changing her blood pressure medications -Please continue to exercise watch her diet and try to lose weight -Please call me if have any questions or concerns or if you need any refills

## 2019-10-16 NOTE — Assessment & Plan Note (Signed)
-  We will recheck patient's A1c today -If this is within normal limits we will stop checking her A1c's

## 2019-10-16 NOTE — Assessment & Plan Note (Signed)
-  This problem is chronic and stable -Patient states that she had a sinus issue last week that caused an asthma exacerbation but that her breathing is currently back to baseline and that she feels well -Patient is also clear to auscultation today -We will continue with albuterol as needed for now

## 2019-10-16 NOTE — Assessment & Plan Note (Signed)
-  Patient states that she got her flu shot at [facility] -Patient also states that she got her first dose of Covid vaccine on Sunday and had some soreness from this but otherwise feels well

## 2019-10-16 NOTE — Assessment & Plan Note (Signed)
-  This problem is chronic and stable -Patient states that she is compliant with levothyroxine 100 mcg daily -We will recheck a TSH today -No further work-up at this time

## 2019-10-16 NOTE — Progress Notes (Signed)
   Subjective:    Patient ID: Madison Jefferson, female    DOB: 1951-03-23, 69 y.o.   MRN: [id number]  HPI  I seen and examined this patient.  Patient is here for routine follow-up of her hypertension and asthma.  Patient states that she had an asthma exacerbation last week but that she is doing better currently.  She denies any other complaints at this time and states that she is compliant with all her medications.   Review of Systems  Constitutional: Negative.   HENT: Negative.   Respiratory: Negative.   Cardiovascular: Negative.   Gastrointestinal: Negative.   Musculoskeletal: Negative.   Neurological: Negative.   Psychiatric/Behavioral: Negative.        Objective:   Physical Exam Vitals reviewed.  Constitutional:      Appearance: Normal appearance.  HENT:     Head: Normocephalic and atraumatic.  Eyes:     Pupils: Pupils are equal, round, and reactive to light.  Cardiovascular:     Rate and Rhythm: Normal rate and regular rhythm.     Heart sounds: Normal heart sounds.  Pulmonary:     Breath sounds: Normal breath sounds. No wheezing or rales.  Abdominal:     General: Bowel sounds are normal. There is no distension.     Palpations: Abdomen is soft.     Tenderness: There is no abdominal tenderness.  Musculoskeletal:        General: No swelling or tenderness.     Cervical back: Neck supple.  Lymphadenopathy:     Cervical: No cervical adenopathy.  Skin:    General: Skin is warm and dry.  Neurological:     General: No focal deficit present.     Mental Status: She is alert and oriented to person, place, and time.  Psychiatric:        Mood and Affect: Mood normal.        Behavior: Behavior normal.           Assessment & Plan:  Please see problem based charting for assessment and plan:

## 2019-10-16 NOTE — Assessment & Plan Note (Signed)
-  This problem is chronic and stable -Patient's LFTs were within normal limits on her last visit -Patient fibrosis score at her last visit was 1.29 which is not consistent with advanced fibrosis.  No further imaging required at this time -Patient follow-up with Butch Penny for weight loss strategies and states that she is working on her diet as well as trying to walk when it is warmer outside -No further work-up at this time

## 2019-10-17 ENCOUNTER — Encounter: Payer: Self-pay | Admitting: Internal Medicine

## 2019-10-17 LAB — BMP8+ANION GAP
Anion Gap: 18 mmol/L (ref 10.0–18.0)
BUN/Creatinine Ratio: 14 (ref 12–28)
BUN: 14 mg/dL (ref 8–27)
CO2: 22 mmol/L (ref 20–29)
Calcium: 9.1 mg/dL (ref 8.7–10.3)
Chloride: 102 mmol/L (ref 96–106)
Creatinine, Ser: 0.97 mg/dL (ref 0.57–1.00)
GFR calc Af Amer: 68 mL/min/{1.73_m2} (ref >59)
GFR calc non Af Amer: 59 mL/min/{1.73_m2} — ABNORMAL LOW (ref >59)
Glucose: 228 mg/dL — ABNORMAL HIGH (ref 65–99)
Potassium: 3.7 mmol/L (ref 3.5–5.2)
Sodium: 142 mmol/L (ref 134–144)

## 2019-10-17 LAB — VITAMIN D 25 HYDROXY (VIT D DEFICIENCY, FRACTURES): Vit D, 25-Hydroxy: 28.5 ng/mL — ABNORMAL LOW (ref 30.0–100.0)

## 2019-10-17 LAB — TSH: TSH: 1.33 u[IU]/mL (ref 0.450–4.500)

## 2019-11-07 ENCOUNTER — Ambulatory Visit: Payer: Medicare Other | Attending: Internal Medicine

## 2019-11-07 DIAGNOSIS — Z23 Encounter for immunization: Secondary | ICD-10-CM

## 2019-11-07 NOTE — Progress Notes (Signed)
   Covid-19 Vaccination Clinic  Name:  Madison Jefferson    MRN: [id number] DOB: 08-02-1951  11/07/2019  [name] was observed post Covid-19 immunization for 15 minutes without incident. She was provided with Vaccine Information Sheet and instruction to access the V-Safe system.   [name] was instructed to call 911 with any severe reactions post vaccine: [name] Difficulty breathing  . Swelling of face and throat  . A fast heartbeat  . A bad rash all over body  . Dizziness and weakness   Immunizations Administered    Name Date Dose VIS Date Route   Pfizer COVID-19 Vaccine 11/07/2019  9:38 AM 0.3 mL 07/27/2019 Intramuscular   Manufacturer: [place]   Lot: [id number]   [place]: [id number]

## 2019-11-14 ENCOUNTER — Ambulatory Visit (INDEPENDENT_AMBULATORY_CARE_PROVIDER_SITE_OTHER): Payer: Medicare Other | Admitting: Internal Medicine

## 2019-11-14 ENCOUNTER — Other Ambulatory Visit: Payer: Self-pay | Admitting: Internal Medicine

## 2019-11-14 ENCOUNTER — Other Ambulatory Visit: Payer: Self-pay

## 2019-11-14 VITALS — BP 166/70 | HR 76 | Temp 98.0°F | Ht 60.0 in | Wt 197.9 lb

## 2019-11-14 DIAGNOSIS — H532 Diplopia: Secondary | ICD-10-CM

## 2019-11-14 DIAGNOSIS — Z79899 Other long term (current) drug therapy: Secondary | ICD-10-CM

## 2019-11-14 DIAGNOSIS — H4922 Sixth [abducent] nerve palsy, left eye: Secondary | ICD-10-CM | POA: Diagnosis not present

## 2019-11-14 DIAGNOSIS — R519 Headache, unspecified: Secondary | ICD-10-CM

## 2019-11-14 DIAGNOSIS — I10 Essential (primary) hypertension: Secondary | ICD-10-CM | POA: Diagnosis not present

## 2019-11-14 MED ORDER — SPIRONOLACTONE 25 MG PO TABS: 25.0000 mg | ORAL_TABLET | Freq: Every day | ORAL | 0 refills | Status: DC

## 2019-11-14 NOTE — Patient Instructions (Signed)
You were seen for followup on your blood pressure and double vision. Here are my recommendations  1) We have placed an order for an MRI of your brain to check for any abnormality that could be causing your double vision  2) We have started spironolactone 25 mg once daily for your blood pressure and are checking blood work to try to figure out why your blood pressure is elevated  Thank you for allowing [place] to be part of your medical care!

## 2019-11-15 ENCOUNTER — Other Ambulatory Visit: Payer: Self-pay

## 2019-11-15 ENCOUNTER — Ambulatory Visit (HOSPITAL_COMMUNITY)
Admission: RE | Admit: 2019-11-15 | Discharge: 2019-11-15 | Disposition: A | Discharge status: Home / Self Care | Payer: Medicare Other | Source: Ambulatory Visit | Attending: Internal Medicine | Admitting: Internal Medicine

## 2019-11-15 DIAGNOSIS — H4922 Sixth [abducent] nerve palsy, left eye: Secondary | ICD-10-CM | POA: Insufficient documentation

## 2019-11-15 DIAGNOSIS — H532 Diplopia: Secondary | ICD-10-CM | POA: Insufficient documentation

## 2019-11-15 DIAGNOSIS — H4921 Sixth [abducent] nerve palsy, right eye: Secondary | ICD-10-CM | POA: Insufficient documentation

## 2019-11-15 DIAGNOSIS — R519 Headache, unspecified: Secondary | ICD-10-CM | POA: Diagnosis not present

## 2019-11-15 NOTE — Progress Notes (Signed)
   CC: Blood pressure followup, headache, double vision  HPI: Patient is a 69 year old female with past medical history as below who presents for followup on blood pressure and evaluation of headache and double vision.  Past Medical History:  Diagnosis Date  . Allergy   . Arthritis   . Asthma   . Atrial fibrillation ([place])    h/o of isolated AF associated with hypothyroidism   . Diverticulosis    cecum  . Diverticulosis    colonoscopy 2002 [facility]  . Fatty liver   . GERD (gastroesophageal reflux disease)   . Hypertension   . Lumbar radiculopathy   . Thyroid disease    hypothyroid  . TIA (transient ischemic attack)    2006  . Urinary incontinence    mixed urge and stress   . Vitamin D deficiency    Review of Systems:   Review of Systems  Eyes: Positive for double vision and pain.  Neurological: Positive for headaches.  All other systems reviewed and are negative.   Physical Exam:  Vitals:   11/14/19 1544  BP: (!) 166/70  Pulse: 76  Temp: 98 F (36.7 C)  TempSrc: Oral  SpO2: 99%  Weight: 197 lb 14.4 oz (89.8 kg)  Height: 5' (1.524 m)   Physical Exam  Constitutional: She is well-developed, well-nourished, and in no distress.  HENT:  Head: Normocephalic and atraumatic.  Eyes: EOM are normal. Right eye exhibits no discharge. Left eye exhibits no discharge.  Neck: No tracheal deviation present.  Cardiovascular: Normal rate and regular rhythm. Exam reveals no gallop and no friction rub.  No murmur heard. Pulmonary/Chest: Effort normal and breath sounds normal. No respiratory distress. She has no wheezes. She has no rales.  Abdominal: Soft. She exhibits no distension. There is no abdominal tenderness. There is no rebound and no guarding.  Musculoskeletal:        General: No tenderness, deformity or edema. Normal range of motion.     Cervical back: Normal range of motion.  Neurological: She is alert. Coordination normal.  *Strength 5/5 bilaterally *Left lateral  rectus palsy present - on leftward gaze, patient's left eye is unable to fully move laterally (see photo) - during this maneuver patient developed double vision *Cranial nerve exam otherwise without deficit  Skin: Skin is warm and dry. No rash noted. She is not diaphoretic. No erythema.  Psychiatric: Memory and judgment normal.       Assessment & Plan:   See Encounters Tab for problem based charting.  Patient discussed with Dr. Dareen Piano

## 2019-11-15 NOTE — Progress Notes (Signed)
Internal Medicine Clinic Attending  Case discussed with Dr. MacLean at the time of the visit.  We reviewed the resident's history and exam and pertinent patient test results.  I agree with the assessment, diagnosis, and plan of care documented in the resident's note.    

## 2019-11-15 NOTE — Assessment & Plan Note (Addendum)
Blood pressure remains uncontrolled - BP of 166/70 at this encounter and also elevated to 160/62 and 150/62 at the last two encounters. Patient on regimen of Toprol-XL 100 mg daily + olmesartan-amlodipine-HCTZ 40-10-25 mg once daily.  On review of last encounter, patient had similar elevated blood pressure and articulated compliance with regimen. BMP from last encounter reviewed showing creatinine and electrolytes WNL.  A/P: Given patient's treatment resistant hypertension with persistent elevation, will work-up for secondary causes with renin,aldosterone level and start patient on spironolactone *Continue Toprol-XL 100 mg daily *Continue olmesartan-amlodipine-hydrochlorothiazide 40-10-25 mg once daily *Start spironolactone 25 mg once daily, titrate up if remains uncontrolled

## 2019-11-15 NOTE — Assessment & Plan Note (Addendum)
History: Patient reports 2-week history of intermittent double vision with an additional 2-week history of a headache located primarily in the region of her left eyebrow.  Patient denies photophobia, phonophobia.  Objective: Exam demonstrates left lateral rectus palsy.   A/P: Given patient's intermittent diplopia with leftward gaze, left lateral rectus palsy on exam, and accompanying headache I have concern for cranial nerve lesion versus new stroke. Given onset 2 weeks ago and mild symptoms, no indication for hospitalization *Patient counseled to avoid driving for now *Outpatient STAT MR Brain WO Contrast ordered

## 2019-11-16 ENCOUNTER — Other Ambulatory Visit: Payer: Self-pay | Admitting: Internal Medicine

## 2019-11-16 DIAGNOSIS — H4922 Sixth [abducent] nerve palsy, left eye: Secondary | ICD-10-CM

## 2019-11-16 DIAGNOSIS — H499 Unspecified paralytic strabismus: Secondary | ICD-10-CM

## 2019-11-16 DIAGNOSIS — H532 Diplopia: Secondary | ICD-10-CM

## 2019-11-16 NOTE — Progress Notes (Signed)
Attempted to call patient with results, no answer, voicemail not available. Will call later

## 2019-11-16 NOTE — Assessment & Plan Note (Signed)
MR brain reviewed - no abnormality to explain patient's symptoms. Spoke with radiologist at [facility] who recommends MR Orbit W&WO Contrast (to include brainstem) for further evaluation. Given size of CN6, a small lesion could easily have been missed on MR Brain WO Contrast  A/P:  *MR Orbit W&WO Contrast STAT *Urgent referral to neurology for evaluation

## 2019-11-20 ENCOUNTER — Ambulatory Visit (HOSPITAL_COMMUNITY)
Admission: RE | Admit: 2019-11-20 | Discharge: 2019-11-20 | Disposition: A | Discharge status: Home / Self Care | Payer: Medicare Other | Source: Ambulatory Visit | Attending: Internal Medicine | Admitting: Internal Medicine

## 2019-11-20 ENCOUNTER — Other Ambulatory Visit: Payer: Self-pay

## 2019-11-20 DIAGNOSIS — H4922 Sixth [abducent] nerve palsy, left eye: Secondary | ICD-10-CM | POA: Diagnosis not present

## 2019-11-20 DIAGNOSIS — H532 Diplopia: Secondary | ICD-10-CM | POA: Diagnosis not present

## 2019-11-20 DIAGNOSIS — H499 Unspecified paralytic strabismus: Secondary | ICD-10-CM | POA: Insufficient documentation

## 2019-11-20 DIAGNOSIS — H748X1 Other specified disorders of right middle ear and mastoid: Secondary | ICD-10-CM | POA: Diagnosis not present

## 2019-11-20 MED ORDER — GADOBUTROL 1 MMOL/ML IV SOLN
9.0000 mL | Freq: Once | INTRAVENOUS | Status: AC | PRN
  Administered 2019-11-20: 9 mL via INTRAVENOUS

## 2019-11-23 ENCOUNTER — Encounter: Payer: Self-pay | Admitting: Neurology

## 2019-11-27 LAB — ALDOSTERONE + RENIN ACTIVITY W/ RATIO
ALDOS/RENIN RATIO: 1.6 (ref 0.0–30.0)
ALDOSTERONE: 2.4 ng/dL (ref 0.0–30.0)
Renin: 1.487 ng/mL/hr (ref 0.167–5.380)

## 2019-12-03 NOTE — Progress Notes (Signed)
NEUROLOGY CONSULTATION NOTE  TRINESHA HAGHIGHI MRN: [id number] DOB: [birth year]  Referring provider: Aldine Contes, MD Primary care provider: Aldine Contes, MD  Reason for consult:  Left lateral rectus palsy  HISTORY OF PRESENT ILLNESS: Madison Jefferson. Madison Jefferson is a 69 year old right-handed black female with hypothyroidism and a fib who presents for left lateral rectus palsy.  History supplemented by referring provider's note.  She had her first COVID vaccine shot on 10/14/2019.  About a couple of days later, she began noticing horizontal double vision with certain eye movements, such as while driving or sometimes watching TV.  She also had left ocular pain as well.  No headache or decreased vision.  The eye pain lasted about 3 or 4 weeks and has resolved.  She still notes some photosensitivity when she is outside in the sun.  Double vision is still present with certain gaze.  She did receive her second COVID vaccine shot on 11/07/2019.  MRI of brain without contrast on 11/15/2019 personally reviewed showed mild chronic small vessel ischemic changes in the cerebral white matter but otherwise unremarkable (no brainstem lesion such as stroke or abnormality of cavernous sinus).  MRI of orbits with and without contrast performed on 11/20/2019 personally reviewed showed subtle increased T2 signal in the left lateral rectus muscle, possibly related to denervation.  Labs from March showed TSH 1.330 and Hgb A1c 5.9.  PAST MEDICAL HISTORY: Past Medical History:  Diagnosis Date  . Allergy   . Arthritis   . Asthma   . Atrial fibrillation ([place])    h/o of isolated AF associated with hypothyroidism   . Diverticulosis    cecum  . Diverticulosis    colonoscopy 2002 [facility]  . Fatty liver   . GERD (gastroesophageal reflux disease)   . Hypertension   . Lumbar radiculopathy   . Thyroid disease    hypothyroid  . TIA (transient ischemic attack)    2006  . Urinary incontinence    mixed urge and stress   .  Vitamin D deficiency     PAST SURGICAL HISTORY: Past Surgical History:  Procedure Laterality Date  . ABLATION     thyroid with radioactive iodine ablation  . CARPAL TUNNEL RELEASE     left wrist  . CHOLECYSTECTOMY    . LEFT HEART CATHETERIZATION WITH CORONARY ANGIOGRAM N/A 01/28/2012   Procedure: LEFT HEART CATHETERIZATION WITH CORONARY ANGIOGRAM;  Surgeon: Birdie Riddle, MD;  Location: [facility] CATH [facility];  Service: Cardiovascular;  Laterality: N/A;  . OTHER SURGICAL HISTORY     carpel tunnel surgery   . OTHER SURGICAL HISTORY     carpel tunnel repair  . RIGHT HEART CATH  2005-Dr. Doylene Canard   minimal CAD; left circumflex dominant with 10-15% proximal lesion;; normal LVF  . TUBAL LIGATION    . VAGINAL HYSTERECTOMY     partial hysterectomy 1983 vaginal bleeding    MEDICATIONS: Current Outpatient Medications on File Prior to Visit  Medication Sig Dispense Refill  . albuterol (VENTOLIN HFA) 108 (90 Base) MCG/ACT inhaler INHALE 1 TO 2 PUFFS INTO THE LUNGS EVERY 6 HOURS AS NEEDED FOR WHEEZING OR SHORTNESS OF BREATH 8.5 g 2  . aspirin (ASPIRIN LOW DOSE) 81 MG EC tablet TAKE 1 TABLET(81 MG) BY MOUTH DAILY. SWALLOW WHOLE 90 tablet 1  . Cholecalciferol (VITAMIN D3) 10 MCG (400 UNIT) CAPS Take 800 Units by mouth daily. 60 capsule 1  . levothyroxine (SYNTHROID) 100 MCG tablet Take 1 tablet (100 mcg total) by mouth daily.  90 tablet 2  . loratadine (CLARITIN) 10 MG tablet Take 1 tablet (10 mg total) by mouth daily. 90 tablet 1  . metoprolol succinate (TOPROL-XL) 100 MG 24 hr tablet TAKE 1 TABLET BY MOUTH DAILY FOLLOWING A MEAL 90 tablet 0  . mometasone (NASONEX) 50 MCG/ACT nasal spray Place 2 sprays into the nose daily. 17 g 1  . naproxen (NAPROSYN) 500 MG tablet Take 1 tablet (500 mg total) by mouth 2 (two) times daily as needed for moderate pain. 60 tablet 0  . Olmesartan-amLODIPine-HCTZ 40-10-25 MG TABS Take 1 tablet by mouth daily. 90 tablet 1  . spironolactone (ALDACTONE) 25 MG tablet TAKE 1  TABLET(25 MG) BY MOUTH DAILY 90 tablet 1   No current facility-administered medications on file prior to visit.    ALLERGIES: Allergies  Allergen Reactions  . Oysters [Shellfish Allergy] Rash    FAMILY HISTORY: Family History  Problem Relation Age of Onset  . Diabetes Mother   . Stroke Mother   . Cancer Mother        mother <50 at diagnosis (breast); h/o oral cancer  . Diabetes Father   . Heart disease Father   . Stroke Father   . Hypertension Father   . Diabetes Brother   . Colon cancer Brother   . Diabetes Sister   . Diabetes Paternal Uncle   . Cancer Brother        lung  . Cancer Brother        throat  . Esophageal cancer Neg Hx   . Rectal cancer Neg Hx   . Stomach cancer Neg Hx     SOCIAL HISTORY: Social History   Socioeconomic History  . Marital status: Divorced    Spouse name: Not on file  . Number of children: Not on file  . Years of education: Not on file  . Highest education level: Not on file  Occupational History  . Not on file  Tobacco Use  . Smoking status: Never Smoker  . Smokeless tobacco: Never Used  Substance and Sexual Activity  . Alcohol use: No    Alcohol/week: 0.0 standard drinks  . Drug use: No  . Sexual activity: Not on file  Other Topics Concern  . Not on file  Social History Narrative  . Not on file   Social Determinants of Health   Financial Resource Strain:   . Difficulty of Paying Living Expenses:   Food Insecurity:   . Worried About Charity fundraiser in the Last Year:   . Arboriculturist in the Last Year:   Transportation Needs:   . Film/video editor (Medical):   [name] Lack of Transportation (Non-Medical):   Physical Activity:   . Days of Exercise per Week:   . Minutes of Exercise per Session:   Stress:   . Feeling of Stress :   Social Connections:   . Frequency of Communication with Friends and Family:   . Frequency of Social Gatherings with Friends and Family:   . Attends Religious Services:   . Active  Member of Clubs or Organizations:   . Attends Archivist Meetings:   [name] Marital Status:   Intimate Partner Violence:   . Fear of Current or Ex-Partner:   . Emotionally Abused:   [name] Physically Abused:   . Sexually Abused:     PHYSICAL EXAM: Blood pressure (!) 152/69, pulse 75, height 5' (1.524 m), weight 197 lb 12.8 oz (89.7 kg), SpO2 98 %. General: No  acute distress.  Patient appears well-groomed.  Head:  Normocephalic/atraumatic Eyes:  fundi examined but not visualized Neck: supple, no paraspinal tenderness, full range of motion Back: No paraspinal tenderness Heart: regular rate and rhythm Lungs: Clear to auscultation bilaterally. Vascular: No carotid bruits. Neurological Exam: Mental status: alert and oriented to person, place, and time, recent and remote memory intact, fund of knowledge intact, attention and concentration intact, speech fluent and not dysarthric, language intact. Cranial nerves: CN I: not tested CN II: pupils equal, round and reactive to light, visual fields intact CN III, IV, VI:  reduced abduction of left eye, no nystagmus, no ptosis CN V: facial sensation intact CN VII: upper and lower face symmetric CN VIII: hearing intact CN IX, X: gag intact, uvula midline CN XI: sternocleidomastoid and trapezius muscles intact CN XII: tongue midline Bulk & Tone: normal, no fasciculations. Motor:  5/5 throughout  Sensation:  Pinprick and vibration sensation intact. Deep Tendon Reflexes:  2+ throughout, toes downgoing.  Finger to nose testing:  Without dysmetria.  Heel to shin:  Without dysmetria.  Gait:  Normal station and stride.  Romberg negative.  IMPRESSION: Left abducens nerve palsy.  Likely ischemic.  Unclear if there is any relation to the vaccine.  Not diabetic.  Will look for vasculitis such as Sjogren's.  Will also consider temporal arteritis (rare cases may present with ophthalmoplegia in absence of headache and decreased vision).  Will also check  for autoimmune such as sarcoid.  Will also evaluate for cerebral aneurysm.    PLAN: 1.  Check CTA of head 2.  Check sed rate, CRP, ACE, SSA/SSB antibodies, ANCA 3.  She has appointment with her eye doctor tomorrow. 4.  Further recommendations pending results.  Otherwise, follow up in 3 to 4 months.  Thank you for allowing me to take part in the care of this patient.  Metta Clines, DO  CC:  Aldine Contes, MD

## 2019-12-04 ENCOUNTER — Other Ambulatory Visit: Payer: Medicare Other

## 2019-12-04 ENCOUNTER — Encounter: Payer: Self-pay | Admitting: Neurology

## 2019-12-04 ENCOUNTER — Ambulatory Visit: Payer: Medicare Other | Admitting: Neurology

## 2019-12-04 ENCOUNTER — Other Ambulatory Visit: Payer: Self-pay

## 2019-12-04 VITALS — BP 152/69 | HR 75 | Ht 60.0 in | Wt 197.8 lb

## 2019-12-04 DIAGNOSIS — H4922 Sixth [abducent] nerve palsy, left eye: Secondary | ICD-10-CM

## 2019-12-04 LAB — C-REACTIVE PROTEIN: CRP: <1 mg/dL (ref 0.5–20.0)

## 2019-12-04 LAB — SEDIMENTATION RATE: Sed Rate: 19 mm/hr (ref 0–30)

## 2019-12-04 NOTE — Patient Instructions (Addendum)
1.  We will check sed rate, CRP, ACE, SSA/SSB antibodies and ANCA 2.  Check CTA of head 3.  Further recommendations pending results.  Otherwise, follow up in 3 to 4 months.

## 2019-12-05 DIAGNOSIS — H25813 Combined forms of age-related cataract, bilateral: Secondary | ICD-10-CM | POA: Diagnosis not present

## 2019-12-05 DIAGNOSIS — H4922 Sixth [abducent] nerve palsy, left eye: Secondary | ICD-10-CM | POA: Diagnosis not present

## 2019-12-06 LAB — SJOGRENS SYNDROME-A EXTRACTABLE NUCLEAR ANTIBODY: SSA (Ro) (ENA) Antibody, IgG: <1 AI

## 2019-12-06 LAB — ANGIOTENSIN CONVERTING ENZYME: Angiotensin-Converting Enzyme: 20 U/L (ref 9–67)

## 2019-12-06 LAB — ANCA SCREEN W REFLEX TITER: ANCA Screen: NEGATIVE

## 2019-12-06 LAB — SJOGRENS SYNDROME-B EXTRACTABLE NUCLEAR ANTIBODY: SSB (La) (ENA) Antibody, IgG: <1 AI

## 2019-12-12 ENCOUNTER — Ambulatory Visit: Payer: Medicare Other

## 2019-12-14 ENCOUNTER — Other Ambulatory Visit: Payer: Self-pay | Admitting: Internal Medicine

## 2019-12-19 ENCOUNTER — Ambulatory Visit
Admission: RE | Admit: 2019-12-19 | Discharge: 2019-12-19 | Disposition: A | Discharge status: Home / Self Care | Payer: Medicare Other | Source: Ambulatory Visit | Attending: Neurology | Admitting: Neurology

## 2019-12-19 DIAGNOSIS — H4922 Sixth [abducent] nerve palsy, left eye: Secondary | ICD-10-CM

## 2019-12-19 DIAGNOSIS — I6523 Occlusion and stenosis of bilateral carotid arteries: Secondary | ICD-10-CM | POA: Diagnosis not present

## 2019-12-19 MED ORDER — IOPAMIDOL (ISOVUE-370) INJECTION 76%
75.0000 mL | Freq: Once | INTRAVENOUS | Status: AC | PRN
  Administered 2019-12-19: 75 mL via INTRAVENOUS

## 2020-01-07 DIAGNOSIS — H4922 Sixth [abducent] nerve palsy, left eye: Secondary | ICD-10-CM | POA: Diagnosis not present

## 2020-01-07 DIAGNOSIS — H25813 Combined forms of age-related cataract, bilateral: Secondary | ICD-10-CM | POA: Diagnosis not present

## 2020-01-17 ENCOUNTER — Other Ambulatory Visit: Payer: Self-pay | Admitting: Internal Medicine

## 2020-01-17 DIAGNOSIS — I10 Essential (primary) hypertension: Secondary | ICD-10-CM

## 2020-02-22 DIAGNOSIS — H4922 Sixth [abducent] nerve palsy, left eye: Secondary | ICD-10-CM | POA: Diagnosis not present

## 2020-02-22 DIAGNOSIS — H25813 Combined forms of age-related cataract, bilateral: Secondary | ICD-10-CM | POA: Diagnosis not present

## 2020-04-10 ENCOUNTER — Other Ambulatory Visit: Payer: Self-pay | Admitting: Internal Medicine

## 2020-04-11 NOTE — Progress Notes (Signed)
NEUROLOGY FOLLOW UP OFFICE NOTE  Madison Jefferson [id number]  HISTORY OF PRESENT ILLNESS: Madison Jefferson is a 69 year old right-handed black female with hypothyroidism and a fib who follows up for left abducens nerve palsy.  UPDATE: Labs from 12/04/2019 demonstrated sed rate 19, ACE 20, negative CRP, negative SSA/SSB antibodies and negative ANCA CTA of head on 12/19/2019 showed 1-2 mm aneurysm vs infundibulum arising from the paraclinoid right ICA but no explanation for symptoms. She did follow up with ophthalmology on 12/05/2019 who believed that with negative workup, likely ischemic.  HISTORY: She had her first COVID vaccine shot on 10/14/2019.  About a couple of days later, she began noticing horizontal double vision with certain eye movements, such as while driving or sometimes watching TV.  She also had left ocular pain as well.  No headache or decreased vision.  The eye pain lasted about 3 or 4 weeks and has resolved.  She still notes some photosensitivity when she is outside in the sun.  Double vision is still present with certain gaze.  She did receive her second COVID vaccine shot on 11/07/2019.  MRI of brain without contrast on 11/15/2019 personally reviewed showed mild chronic small vessel ischemic changes in the cerebral white matter but otherwise unremarkable (no brainstem lesion such as stroke or abnormality of cavernous sinus).  MRI of orbits with and without contrast performed on 11/20/2019 personally reviewed showed subtle increased T2 signal in the left lateral rectus muscle, possibly related to denervation.  Labs from March showed TSH 1.330 and Hgb A1c 5.9.  PAST MEDICAL HISTORY: Past Medical History:  Diagnosis Date  . Allergy   . Arthritis   . Asthma   . Atrial fibrillation ([place])    h/o of isolated AF associated with hypothyroidism   . Diverticulosis    cecum  . Diverticulosis    colonoscopy 2002 [facility]  . Fatty liver   . GERD (gastroesophageal reflux disease)   .  Hypertension   . Lumbar radiculopathy   . Thyroid disease    hypothyroid  . TIA (transient ischemic attack)    2006  . Urinary incontinence    mixed urge and stress   . Vitamin D deficiency     MEDICATIONS: Current Outpatient Medications on File Prior to Visit  Medication Sig Dispense Refill  . albuterol (VENTOLIN HFA) 108 (90 Base) MCG/ACT inhaler INHALE 1 TO 2 PUFFS INTO THE LUNGS EVERY 6 HOURS AS NEEDED FOR WHEEZING OR SHORTNESS OF BREATH 8.5 g 2  . aspirin (ASPIRIN LOW DOSE) 81 MG EC tablet TAKE 1 TABLET(81 MG) BY MOUTH DAILY. SWALLOW WHOLE 90 tablet 1  . Cholecalciferol (VITAMIN D3) 10 MCG (400 UNIT) CAPS Take 800 Units by mouth daily. 60 capsule 1  . levothyroxine (SYNTHROID) 100 MCG tablet Take 1 tablet (100 mcg total) by mouth daily. 90 tablet 2  . loratadine (CLARITIN) 10 MG tablet Take 1 tablet (10 mg total) by mouth daily. 90 tablet 1  . metoprolol succinate (TOPROL-XL) 100 MG 24 hr tablet TAKE 1 TABLET BY MOUTH DAILY FOLLOWING A MEAL 90 tablet 0  . mometasone (NASONEX) 50 MCG/ACT nasal spray Place 2 sprays into the nose daily. 17 g 1  . naproxen (NAPROSYN) 500 MG tablet Take 1 tablet (500 mg total) by mouth 2 (two) times daily as needed for moderate pain. 60 tablet 0  . Olmesartan-amLODIPine-HCTZ 40-10-25 MG TABS Take 1 tablet by mouth daily. 90 tablet 1  . spironolactone (ALDACTONE) 25 MG tablet TAKE  1 TABLET(25 MG) BY MOUTH DAILY 90 tablet 1   No current facility-administered medications on file prior to visit.    ALLERGIES: Allergies  Allergen Reactions  . Oysters [Shellfish Allergy] Rash    FAMILY HISTORY: Family History  Problem Relation Age of Onset  . Diabetes Mother   . Stroke Mother   . Cancer Mother        mother <50 at diagnosis (breast); h/o oral cancer  . Diabetes Father   . Heart disease Father   . Stroke Father   . Hypertension Father   . Diabetes Brother   . Colon cancer Brother   . Diabetes Sister   . Diabetes Paternal Uncle   . Cancer  Brother        lung  . Cancer Brother        throat  . Esophageal cancer Neg Hx   . Rectal cancer Neg Hx   . Stomach cancer Neg Hx     SOCIAL HISTORY: Social History   Socioeconomic History  . Marital status: Divorced    Spouse name: Not on file  . Number of children: Not on file  . Years of education: Not on file  . Highest education level: Not on file  Occupational History  . Not on file  Tobacco Use  . Smoking status: Never Smoker  . Smokeless tobacco: Never Used  Substance and Sexual Activity  . Alcohol use: No    Alcohol/week: 0.0 standard drinks  . Drug use: No  . Sexual activity: Not on file  Other Topics Concern  . Not on file  Social History Narrative   Right handed   Lives alone in a two story   Social Determinants of Health   Financial Resource Strain:   . Difficulty of Paying Living Expenses: Not on file  Food Insecurity:   . Worried About Charity fundraiser in the Last Year: Not on file  . Ran Out of Food in the Last Year: Not on file  Transportation Needs:   . Lack of Transportation (Medical): Not on file  . Lack of Transportation (Non-Medical): Not on file  Physical Activity:   . Days of Exercise per Week: Not on file  . Minutes of Exercise per Session: Not on file  Stress:   . Feeling of Stress : Not on file  Social Connections:   . Frequency of Communication with Friends and Family: Not on file  . Frequency of Social Gatherings with Friends and Family: Not on file  . Attends Religious Services: Not on file  . Active Member of Clubs or Organizations: Not on file  . Attends Archivist Meetings: Not on file  . Marital Status: Not on file  Intimate Partner Violence:   . Fear of Current or Ex-Partner: Not on file  . Emotionally Abused: Not on file  . Physically Abused: Not on file  . Sexually Abused: Not on file    PHYSICAL EXAM: Blood pressure (!) 162/79, pulse 78, height 5' (1.524 m), weight 200 lb 6.4 oz (90.9 kg), SpO2 96  %. General: No acute distress.  Patient appears well-groomed.   Head:  Normocephalic/atraumatic Eyes:  Fundi examined but not visualized Neck: supple, no paraspinal tenderness, full range of motion Heart:  Regular rate and rhythm Lungs:  Clear to auscultation bilaterally Back: No paraspinal tenderness Neurological Exam: alert and oriented to person, place, and time. Attention span and concentration intact, recent and remote memory intact, fund of knowledge intact.  Speech  fluent and not dysarthric, language intact.  CN II-XII intact. Bulk and tone normal, muscle strength 5/5 throughout.  Sensation to light touch, temperature and vibration intact.  Deep tendon reflexes 2+ throughout, toes downgoing.  Finger to nose and heel to shin testing intact.  Gait normal, Romberg negative.  IMPRESSION: 1.  Ischemic left abducens nerve palsy 2.  Questionable tiny cerebral aneurysm vs infundibulum  PLAN: 1.  Repeat CTA of head in one year 2.  Follow up after repeat CTA  Metta Clines, DO  CC: Aldine Contes, MD

## 2020-04-14 ENCOUNTER — Ambulatory Visit (INDEPENDENT_AMBULATORY_CARE_PROVIDER_SITE_OTHER): Payer: Medicare Other | Admitting: Neurology

## 2020-04-14 ENCOUNTER — Encounter: Payer: Self-pay | Admitting: Neurology

## 2020-04-14 ENCOUNTER — Other Ambulatory Visit: Payer: Self-pay

## 2020-04-14 ENCOUNTER — Other Ambulatory Visit: Payer: Self-pay | Admitting: Internal Medicine

## 2020-04-14 VITALS — BP 162/79 | HR 78 | Ht 60.0 in | Wt 200.4 lb

## 2020-04-14 DIAGNOSIS — H4922 Sixth [abducent] nerve palsy, left eye: Secondary | ICD-10-CM

## 2020-04-14 DIAGNOSIS — I671 Cerebral aneurysm, nonruptured: Secondary | ICD-10-CM | POA: Diagnosis not present

## 2020-04-14 DIAGNOSIS — J45909 Unspecified asthma, uncomplicated: Secondary | ICD-10-CM

## 2020-04-14 NOTE — Patient Instructions (Signed)
1.  Repeat CTA of head in one year 2.  Follow up afterwards

## 2020-04-17 ENCOUNTER — Other Ambulatory Visit: Payer: Self-pay | Admitting: Internal Medicine

## 2020-04-17 DIAGNOSIS — I10 Essential (primary) hypertension: Secondary | ICD-10-CM

## 2020-05-29 ENCOUNTER — Encounter: Payer: Self-pay | Admitting: *Deleted

## 2020-06-24 ENCOUNTER — Encounter: Payer: Medicare Other | Admitting: Internal Medicine

## 2020-07-15 ENCOUNTER — Encounter: Payer: Self-pay | Admitting: Internal Medicine

## 2020-07-15 ENCOUNTER — Ambulatory Visit (INDEPENDENT_AMBULATORY_CARE_PROVIDER_SITE_OTHER): Payer: Medicare Other | Admitting: Internal Medicine

## 2020-07-15 VITALS — BP 148/70 | HR 67 | Temp 98.2°F | Ht 60.0 in | Wt 202.6 lb

## 2020-07-15 DIAGNOSIS — Z6836 Body mass index (BMI) 36.0-36.9, adult: Secondary | ICD-10-CM

## 2020-07-15 DIAGNOSIS — E559 Vitamin D deficiency, unspecified: Secondary | ICD-10-CM

## 2020-07-15 DIAGNOSIS — J301 Allergic rhinitis due to pollen: Secondary | ICD-10-CM

## 2020-07-15 DIAGNOSIS — E669 Obesity, unspecified: Secondary | ICD-10-CM

## 2020-07-15 DIAGNOSIS — Z0000 Encounter for general adult medical examination without abnormal findings: Secondary | ICD-10-CM | POA: Diagnosis not present

## 2020-07-15 DIAGNOSIS — K760 Fatty (change of) liver, not elsewhere classified: Secondary | ICD-10-CM

## 2020-07-15 DIAGNOSIS — E039 Hypothyroidism, unspecified: Secondary | ICD-10-CM | POA: Diagnosis not present

## 2020-07-15 DIAGNOSIS — J45909 Unspecified asthma, uncomplicated: Secondary | ICD-10-CM

## 2020-07-15 DIAGNOSIS — H4922 Sixth [abducent] nerve palsy, left eye: Secondary | ICD-10-CM

## 2020-07-15 DIAGNOSIS — I10 Essential (primary) hypertension: Secondary | ICD-10-CM

## 2020-07-15 MED ORDER — VITAMIN D3 10 MCG (400 UNIT) PO CAPS: 800.0000 [IU] | ORAL_CAPSULE | Freq: Every day | ORAL | 1 refills | Status: DC

## 2020-07-15 MED ORDER — LORATADINE 10 MG PO TABS: 10.0000 mg | ORAL_TABLET | Freq: Every day | ORAL | 1 refills | Status: DC

## 2020-07-15 MED ORDER — LEVOTHYROXINE SODIUM 100 MCG PO TABS: 100.0000 ug | ORAL_TABLET | Freq: Every day | ORAL | 2 refills | Status: DC

## 2020-07-15 MED ORDER — OLMESARTAN-AMLODIPINE-HCTZ 40-10-25 MG PO TABS: 1.0000 | ORAL_TABLET | Freq: Every day | ORAL | 1 refills | Status: DC

## 2020-07-15 MED ORDER — ASPIRIN 81 MG PO TBEC: DELAYED_RELEASE_TABLET | ORAL | 1 refills | Status: DC

## 2020-07-15 MED ORDER — METOPROLOL SUCCINATE ER 100 MG PO TB24: ORAL_TABLET | ORAL | 0 refills | Status: DC

## 2020-07-15 MED ORDER — MOMETASONE FUROATE 50 MCG/ACT NA SUSP: 2.0000 | Freq: Every day | NASAL | 1 refills | Status: DC

## 2020-07-15 NOTE — Assessment & Plan Note (Signed)
-  Patient was seen by ophthalmology and neurology for her left abducens nerve palsy -Work-up was negative and this was thought to be secondary to an ischemic etiology -Patient denies any double vision currently and her extraocular movements are intact -Given that this is thought to be secondary to an ischemic etiology we will continue with low-dose aspirin at this time -Patient is also noted to have a possible small cerebral aneurysm versus infundibulum and will follow up with repeat CTA in 1 year as well as follow-up with neurology -No further work-up at this time

## 2020-07-15 NOTE — Assessment & Plan Note (Signed)
BP Readings from Last 3 Encounters:  07/15/20 (!) 148/70  04/14/20 (!) 162/79  12/04/19 (!) 152/69    Lab Results  Component Value Date   NA 142 10/16/2019   K 3.7 10/16/2019   CREATININE 0.97 10/16/2019    Assessment: Blood pressure control:  Fair Progress toward BP goal:   Improved Comments: Patient states that she is compliant with olmesartan/amlodipine/HCTZ 40/10/20 5 mg as well as Toprol-XL 100 mg daily.  Patient states that she has not been taking spironolactone and is unsure whether this was prescribed for her or not  Plan: Medications:  continue current medications Educational resources provided:   Self management tools provided:   Other plans: We will check BMP.  If patient blood pressure remains elevated will consider adding spironolactone at her next visit but currently her blood pressure is improved from last time so we will continue with current medications for now

## 2020-07-15 NOTE — Assessment & Plan Note (Signed)
-  This problems chronic and stable -Patient states that her asthma is currently well controlled on albuterol as needed -No further work-up at this time -We will refill her albuterol inhaler today

## 2020-07-15 NOTE — Progress Notes (Signed)
   Subjective:    Patient ID: Madison Jefferson, female    DOB: 1951/05/20, 69 y.o.   MRN: [id number]  HPI  I have seen and examined this patient.  Patient is here for routine follow-up of her hypertension and fatty liver.  Patient states that she is compliant with her medications and denies any new complaints at this time.  She did have a question about whether she needed to continue on her low-dose aspirin at this time.  Patient also does have a" runny nose" which she attributes to the season changing  Review of Systems  Constitutional: Negative.  Negative for fatigue and fever.  HENT: Positive for rhinorrhea.   Respiratory: Negative.  Negative for cough.   Cardiovascular: Negative.   Gastrointestinal: Negative.   Musculoskeletal: Negative.   Neurological: Negative.   Psychiatric/Behavioral: Negative.        Objective:   Physical Exam Constitutional:      Appearance: Normal appearance.  HENT:     Head: Normocephalic and atraumatic.  Cardiovascular:     Rate and Rhythm: Normal rate and regular rhythm.     Heart sounds: Normal heart sounds.  Pulmonary:     Effort: Pulmonary effort is normal.     Breath sounds: Normal breath sounds. No wheezing or rales.  Abdominal:     General: Bowel sounds are normal. There is no distension.     Palpations: Abdomen is soft.     Tenderness: There is no abdominal tenderness.  Musculoskeletal:        General: No swelling or tenderness.     Cervical back: Neck supple.  Lymphadenopathy:     Cervical: No cervical adenopathy.  Neurological:     Mental Status: She is alert and oriented to person, place, and time.     Comments: Extraocular movements appear intact and patient is able to look to the left without any diplopia  Psychiatric:        Mood and Affect: Mood normal.        Behavior: Behavior normal.           Assessment & Plan:  Please see problem based charting for assessment and plan:

## 2020-07-15 NOTE — Assessment & Plan Note (Signed)
-  This problem is chronic and stable -Patient states that she is compliant with her levothyroxine 100 mcg daily -She does complain of increased weight despite diet and exercise -We will recheck her TSH today -No further work-up at this time

## 2020-07-15 NOTE — Assessment & Plan Note (Signed)
-  Patient states that she already received her flu shot at the pharmacy -She has also completed her COVID-19 vaccinations as well as a booster shot -No further work-up

## 2020-07-15 NOTE — Assessment & Plan Note (Signed)
-  This problem is chronic and stable -Patient follow-up with [facility] for weight loss strategies but her weight is slightly increased from last time up to 202 pounds -I explained the importance of weight loss given her fatty liver and patient expressed understanding -She will continue to work on her diet and exercise and we will follow up with this at her next visit

## 2020-07-15 NOTE — Assessment & Plan Note (Signed)
-  Patient does complain of rhinorrhea today likely secondary to her allergic rhinitis -She denies any fevers, no chills, no fatigue, no headache, no blurry vision, no cough -We will continue with loratadine and mometasone nasal spray -These were refilled for her today

## 2020-07-15 NOTE — Assessment & Plan Note (Signed)
-  This problem is chronic and stable -We will recheck her LFTs on this visit -Patient's last fibrosis score was low and was not consistent with advanced fibrosis -Patient states that she followed up with [facility] for weight loss strategies but her weight is slightly up today up to 202 pounds -I explained to the patient the importance of weight loss for her fatty liver disease and she states that she will continue to watch her diet and attempt exercise to lose weight

## 2020-07-15 NOTE — Patient Instructions (Signed)
-  It was a pleasure seeing you today -Have a great holiday season! -We will check some blood work on you today including your liver function, kidney function as well as your thyroid and vitamin D levels -I will refill your medications for your -Your blood pressure has improved.  Keep up the great work -Please call me for any questions or concerns or if you need any refills

## 2020-07-15 NOTE — Assessment & Plan Note (Signed)
-  This problem is chronic and stable -Patient compliant vitamin D supplementation but her last vitamin D level was still below normal -We will recheck her vitamin D level today

## 2020-07-16 ENCOUNTER — Telehealth: Payer: Self-pay | Admitting: Internal Medicine

## 2020-07-16 ENCOUNTER — Telehealth: Payer: Self-pay

## 2020-07-16 ENCOUNTER — Encounter: Payer: Self-pay | Admitting: Internal Medicine

## 2020-07-16 ENCOUNTER — Other Ambulatory Visit: Payer: Self-pay | Admitting: Internal Medicine

## 2020-07-16 DIAGNOSIS — E039 Hypothyroidism, unspecified: Secondary | ICD-10-CM

## 2020-07-16 DIAGNOSIS — I10 Essential (primary) hypertension: Secondary | ICD-10-CM

## 2020-07-16 LAB — CMP14 + ANION GAP
ALT: 28 IU/L (ref 0–32)
AST: 19 IU/L (ref 0–40)
Albumin/Globulin Ratio: 1.6 (ref 1.2–2.2)
Albumin: 4.2 g/dL (ref 3.7–4.7)
Alkaline Phosphatase: 52 IU/L (ref 44–121)
Anion Gap: 17 mmol/L (ref 10.0–18.0)
BUN/Creatinine Ratio: 13 (ref 12–28)
BUN: 13 mg/dL (ref 8–27)
Bilirubin Total: 0.7 mg/dL (ref 0.0–1.2)
CO2: 24 mmol/L (ref 20–29)
Calcium: 9 mg/dL (ref 8.7–10.3)
Chloride: 97 mmol/L (ref 96–106)
Creatinine, Ser: 0.97 mg/dL (ref 0.57–1.00)
GFR calc Af Amer: 68 mL/min/{1.73_m2} (ref >59)
GFR calc non Af Amer: 59 mL/min/{1.73_m2} — ABNORMAL LOW (ref >59)
Globulin, Total: 2.6 g/dL (ref 1.5–4.5)
Glucose: 200 mg/dL — ABNORMAL HIGH (ref 65–99)
Potassium: 3.6 mmol/L (ref 3.5–5.2)
Sodium: 138 mmol/L (ref 134–144)
Total Protein: 6.8 g/dL (ref 6.0–8.5)

## 2020-07-16 LAB — TSH: TSH: 3.32 u[IU]/mL (ref 0.450–4.500)

## 2020-07-16 LAB — VITAMIN D 25 HYDROXY (VIT D DEFICIENCY, FRACTURES): Vit D, 25-Hydroxy: 38.3 ng/mL (ref 30.0–100.0)

## 2020-07-16 NOTE — Telephone Encounter (Signed)
I attempted to call the patient to discuss the results of her blood work with her.  Patient's BMP, TSH and vitamin D levels are within normal limits.  She was noted to have a mildly elevated blood glucose up to 200.  Her last A1c was 5.9 in March.  We will recheck an A1c at her next visit.  We will send the patient a letter with her results and recommendations.

## 2020-07-16 NOTE — Telephone Encounter (Signed)
Requesting to speak with Dr. Dareen Piano for test results. Please call pt back.

## 2020-07-16 NOTE — Telephone Encounter (Signed)
I called the patient discussed the results of her blood work with her.  Patient's BMP, TSH and vitamin D levels are within normal limits.  She did have a mildly elevated blood glucose of 200.  She did have an A1c done in March of this year which was 5.9.  We will recheck an A1c at her next visit given the hyperglycemia noted on her CBC.  No further work-up at this time.  Patient expressed understanding and is in agreement with plan.

## 2020-07-16 NOTE — Telephone Encounter (Signed)
Confirmed w/ pharmacist that [facility] did indeed receive all 7 refill scripts 11/30

## 2020-07-16 NOTE — Telephone Encounter (Signed)
I refilled these yesterday. Did she not get them?

## 2020-07-29 ENCOUNTER — Encounter: Payer: Self-pay | Admitting: *Deleted

## 2020-07-29 NOTE — Progress Notes (Unsigned)

## 2020-08-05 NOTE — Progress Notes (Unsigned)
Things That May Be Affecting Your Health:  Alcohol  Hearing loss  Pain    Depression  Home Safety  Sexual Health   Diabetes x Lack of physical activity  Stress  x Difficulty with daily activities  Loneliness  Tiredness   Drug use  Medicines  Tobacco use   Falls  Motor Vehicle Safety x Weight   Food choices  Oral Health  Other    YOUR PERSONALIZED HEALTH PLAN : 1. Schedule your next subsequent Medicare Wellness visit in one year 2. Attend all of your regular appointments to address your medical issues 3. Complete the preventative screenings and services   Annual Wellness Visit   Medicare Covered Preventative Screenings and [place] Men and Women Who How Often Need? Date of Last Service Action  Abdominal Aortic Aneurysm Adults with AAA risk factors Once     Alcohol Misuse and Counseling All Adults Screening once a year if no alcohol misuse. Counseling up to 4 face to face sessions.     Bone Density Measurement  Adults at risk for osteoporosis Once every 2 yrs     Lipid Panel Z13.6 All adults without CV disease Once every 5 yrs     Colorectal Cancer   Stool sample or  Colonoscopy All adults 9 and older   Once every year  Every 10 years     Depression All Adults Once a year  Today   Diabetes Screening Blood glucose, post glucose load, or GTT Z13.1  All adults at risk  Pre-diabetics  Once per year  Twice per year     Diabetes  Self-Management Training All adults Diabetics 10 hrs first year; 2 hours subsequent years. Requires Copay     Glaucoma  Diabetics  Family history of glaucoma  African Americans 69 yrs +  Hispanic Americans 64 yrs + Annually - requires coppay     Hepatitis C Z72.89 or F19.20  High Risk for HCV  Born between 1945 and 1965  Annually  Once     HIV Z11.4 All adults based on risk  Annually btw ages 50 & 49 regardless of risk  Annually > 65 yrs if at increased risk     Lung Cancer Screening Asymptomatic adults aged  81-77 with 30 pack yr history and current smoker OR quit within the last 15 yrs Annually Must have counseling and shared decision making documentation before first screen     Medical Nutrition Therapy Adults with   Diabetes  Renal disease  Kidney transplant within past 3 yrs 3 hours first year; 2 hours subsequent years     Obesity and Counseling All adults Screening once a year Counseling if BMI 30 or higher  Today   Tobacco Use Counseling Adults who use tobacco  Up to 8 visits in one year     Vaccines Z23  Hepatitis B  Influenza   Pneumonia  Adults   Once  Once every flu season  Two different vaccines separated by one year x Influenza Covid booster   Next Annual Wellness Visit People with Medicare Every year  Today     Services & Screenings Women Who How Often Need  Date of Last Service Action  Mammogram  Z12.31 Women over 22 One baseline ages 63-39. Annually ager 40 yrs+     Pap tests All women Annually if high risk. Every 2 yrs for normal risk women     Screening for cervical cancer with   Pap (Z01.419 nl or Z01.411abnl) &  HPV Z11.51 Women aged 51 to 43 Once every 5 yrs     Screening pelvic and breast exams All women Annually if high risk. Every 2 yrs for normal risk women     Sexually Transmitted Diseases  Chlamydia  Gonorrhea  Syphilis All at risk adults Annually for non pregnant females at increased risk         [place] Men Who How Ofter Need  Date of Last Service Action  Prostate Cancer - DRE & PSA Men over 50 Annually.  DRE might require a copay.     Sexually Transmitted Diseases  Syphilis All at risk adults Annually for men at increased risk

## 2020-08-27 ENCOUNTER — Ambulatory Visit (INDEPENDENT_AMBULATORY_CARE_PROVIDER_SITE_OTHER): Payer: Medicare Other | Admitting: Internal Medicine

## 2020-08-27 ENCOUNTER — Ambulatory Visit (HOSPITAL_COMMUNITY)
Admission: RE | Admit: 2020-08-27 | Discharge: 2020-08-27 | Disposition: A | Discharge status: Home / Self Care | Payer: Medicare Other | Source: Ambulatory Visit | Attending: Internal Medicine | Admitting: Internal Medicine

## 2020-08-27 ENCOUNTER — Other Ambulatory Visit: Payer: Self-pay

## 2020-08-27 VITALS — BP 176/69 | HR 78 | Temp 98.4°F | Wt 199.2 lb

## 2020-08-27 DIAGNOSIS — H4921 Sixth [abducent] nerve palsy, right eye: Secondary | ICD-10-CM

## 2020-08-27 DIAGNOSIS — H532 Diplopia: Secondary | ICD-10-CM | POA: Diagnosis not present

## 2020-08-27 DIAGNOSIS — I10 Essential (primary) hypertension: Secondary | ICD-10-CM | POA: Diagnosis not present

## 2020-08-27 NOTE — Progress Notes (Addendum)
   CC: right eye double vision, headache   HPI:  Madison Jefferson is a 70 y.o. female with PMHx as listed below presenting with right eye double vision and slight headache x 3 days duration. Similar to prior episode of left abducens nerve palsy in March 2021. Please see problem based charting for complete assessment and plan.  Past Medical History:  Diagnosis Date  . Allergy   . Arthritis   . Asthma   . Atrial fibrillation ([place])    h/o of isolated AF associated with hypothyroidism   . Diverticulosis    cecum  . Diverticulosis    colonoscopy 2002 [facility]  . Fatty liver   . GERD (gastroesophageal reflux disease)   . Hypertension   . Lumbar radiculopathy   . Thyroid disease    hypothyroid  . TIA (transient ischemic attack)    2006  . Urinary incontinence    mixed urge and stress   . Vitamin D deficiency    Review of Systems:  Negative except as stated in HPI.  Physical Exam:  Vitals:   08/27/20 1429  BP: (!) 176/69  Pulse: 78  Temp: 98.4 F (36.9 C)  TempSrc: Oral  SpO2: 99%  Weight: 199 lb 3.2 oz (90.4 kg)   Physical Exam  Constitutional: Appears well-developed and well-nourished. No distress.  HENT: Normocephalic and atraumatic, right LR palsy with disconjugate horizontal gaze to the right; remainder of extraocular eye movements intact;  conjunctiva normal, moist mucous membranes Cardiovascular: Normal rate, regular rhythm, S1 and S2 present, no murmurs, rubs, gallops.  Distal pulses intact Respiratory: No respiratory distress, no accessory muscle use.  Effort is normal.  Lungs are clear to auscultation bilaterally. Neurological: Is alert and oriented x4, no apparent focal deficits noted. Skin: Warm and dry.  No rash, erythema, lesions noted.   Assessment & Plan:   See Encounters Tab for problem based charting.  Patient discussed with Dr. Philipp Ovens

## 2020-08-27 NOTE — Patient Instructions (Addendum)
Madison Jefferson,  It was a pleasure seeing you in clinic. Today we discussed:   Right eye double vision:  I have ordered a MRI Brain to further evaluate for this scheduled for 08/29/2019 at [place] [facility]. EKG was obtained at this visit which was normal. I will further discuss this with your neurologist. I am also obtaining some blood work. I will call you with any abnormal results.   If you have any questions or concerns, please call our clinic at [phone] between 9am-5pm and after hours call [phone] and ask for the internal medicine resident on call. If you feel you are having a medical emergency please call 911.   Thank you, we look forward to helping you remain healthy!

## 2020-08-28 ENCOUNTER — Ambulatory Visit (HOSPITAL_COMMUNITY)
Admission: RE | Admit: 2020-08-28 | Discharge: 2020-08-28 | Disposition: A | Discharge status: Home / Self Care | Payer: Medicare Other | Source: Ambulatory Visit | Attending: Internal Medicine | Admitting: Internal Medicine

## 2020-08-28 DIAGNOSIS — R29818 Other symptoms and signs involving the nervous system: Secondary | ICD-10-CM | POA: Diagnosis not present

## 2020-08-28 DIAGNOSIS — H532 Diplopia: Secondary | ICD-10-CM | POA: Diagnosis not present

## 2020-08-28 DIAGNOSIS — R9082 White matter disease, unspecified: Secondary | ICD-10-CM | POA: Diagnosis not present

## 2020-08-28 DIAGNOSIS — M2548 Effusion, other site: Secondary | ICD-10-CM | POA: Diagnosis not present

## 2020-08-28 DIAGNOSIS — H748X1 Other specified disorders of right middle ear and mastoid: Secondary | ICD-10-CM | POA: Diagnosis not present

## 2020-08-28 LAB — C-REACTIVE PROTEIN: CRP: 2 mg/L (ref 0–10)

## 2020-08-28 LAB — SEDIMENTATION RATE: Sed Rate: 14 mm/hr (ref 0–40)

## 2020-08-28 MED ORDER — GADOBUTROL 1 MMOL/ML IV SOLN
9.0000 mL | Freq: Once | INTRAVENOUS | Status: AC | PRN
  Administered 2020-08-28: 9 mL via INTRAVENOUS

## 2020-08-29 ENCOUNTER — Other Ambulatory Visit (INDEPENDENT_AMBULATORY_CARE_PROVIDER_SITE_OTHER): Payer: Medicare Other

## 2020-08-29 DIAGNOSIS — H25813 Combined forms of age-related cataract, bilateral: Secondary | ICD-10-CM | POA: Diagnosis not present

## 2020-08-29 DIAGNOSIS — H532 Diplopia: Secondary | ICD-10-CM

## 2020-08-29 DIAGNOSIS — H4901 Third [oculomotor] nerve palsy, right eye: Secondary | ICD-10-CM | POA: Diagnosis not present

## 2020-08-29 NOTE — Assessment & Plan Note (Signed)
BP Readings from Last 3 Encounters:  08/27/20 (!) 176/69  07/15/20 (!) 148/70  04/14/20 (!) 162/79   Patient with poorly controlled hypertension. She endorses compliance with her Olmesartan-amlodipine-HCTZ daily and Toprol XL daily. Although notes that she did not take her medications this morning. Suspect that may be contributing to her elevated BP at this visit. She does endorse a mild right sided headache and double vision, however this has been persistent for three days duration.   Plan:  Continue current regimen  F/u in 2-3 weeks for BP check and would consider addition of spironolactone at next visit

## 2020-08-29 NOTE — Assessment & Plan Note (Addendum)
Patient presents with approximately three day history of right eye pain, double vision and a mild right sided headache. She endorses sudden onset of symptoms on Sunday evening that progressively worsened on Monday and have been constant since. She notes that her double vision is improved with closing the right eye.On examination, patient has right lateral rectus palsy. Remainder of extraocular eye movements are intact.  Of note, patient had similar presentation in March 2021 with left lateral rectus palsy and patient notes that current episode feels similar to that. At the time, work up, including MRI Brains and [facility]bits w and w/o contract and ESR, CRP were negative for acute intracranial abnormality with possible denervation of the left lateral rectus nerve and small vessel ischemic changes. CTA with possible small cerebral aneurysm vs infundibulum arising from paraclinoid right ICA noted. Patient was also evaluated by ophthalmology and neurology for symptoms. Further work up, including SSA/B antibodies and ANCA were negative as well. Symptoms were thought to be secondary to ischemia and patient was started on low-dose aspirin at the time. She continues to take aspirin. Etiologies include neoplasm, inflammation/infection, demyelination, ischemia, subarachnoid space lesions. Given prior benign MRI findings, neoplasm is less likely. MS is a possibility; however, no demyelination noted on prior MRI.  Repeat MRI Brain ordered which was negative for acute intracranial abnormalities but did show chronic small vessel disease. No demyelination noted. Repeat ESR and CRP wnl, less likely inflammation/infectious etiology of patients symptoms.  Discussed with neurologist, Dr Tomi Likens, who recommended myasthenia panel and, if negative, referral for neuro-ophthalmology.   Plan: Myasthenia gravis panel Continue with aspirin 19m daily  Continue to monitor symptoms, if persistent or recurrent, referral to neuro-ophthalmology      ADDENDUM: Myasthenia gravis panel wnl. Referral to neuro-ophthalmology placed

## 2020-09-03 NOTE — Progress Notes (Signed)
Internal Medicine Clinic Attending ° °Case discussed with Dr. Aslam  At the time of the visit.  We reviewed the resident’s history and exam and pertinent patient test results.  I agree with the assessment, diagnosis, and plan of care documented in the resident’s note.  °

## 2020-09-08 LAB — SPECIMEN STATUS REPORT

## 2020-09-08 LAB — STRIATED AB IGG W/RFX TITER: Striated Muscle Ab, IgG Screen: <1:40 {titer}

## 2020-09-08 LAB — ACHR RECEPTOR MODULATING AB: Acetylcholine Modulating Ab: 0 % (ref <=45)

## 2020-09-09 LAB — MYASTHENIA GRAVIS PROFILE
AChR Binding Ab, Serum: 0.03 nmol/L (ref 0.00–0.24)
Acetylchol Block Ab: 23 % (ref 0–25)

## 2020-09-09 LAB — MUSK ANTIBODIES: MuSK Antibodies: <1 U/mL

## 2020-09-23 ENCOUNTER — Telehealth: Payer: Self-pay | Admitting: Internal Medicine

## 2020-09-23 NOTE — Telephone Encounter (Signed)
Opened in error

## 2020-10-08 DIAGNOSIS — H25813 Combined forms of age-related cataract, bilateral: Secondary | ICD-10-CM | POA: Diagnosis not present

## 2020-10-08 DIAGNOSIS — H4901 Third [oculomotor] nerve palsy, right eye: Secondary | ICD-10-CM | POA: Diagnosis not present

## 2020-10-14 ENCOUNTER — Other Ambulatory Visit: Payer: Self-pay | Admitting: Internal Medicine

## 2020-10-14 ENCOUNTER — Ambulatory Visit (INDEPENDENT_AMBULATORY_CARE_PROVIDER_SITE_OTHER): Payer: Medicare Other | Admitting: Internal Medicine

## 2020-10-14 VITALS — BP 169/69 | HR 85 | Temp 98.9°F | Ht 60.0 in | Wt 198.1 lb

## 2020-10-14 DIAGNOSIS — E039 Hypothyroidism, unspecified: Secondary | ICD-10-CM | POA: Diagnosis not present

## 2020-10-14 DIAGNOSIS — E669 Obesity, unspecified: Secondary | ICD-10-CM

## 2020-10-14 DIAGNOSIS — I10 Essential (primary) hypertension: Secondary | ICD-10-CM

## 2020-10-14 DIAGNOSIS — R7303 Prediabetes: Secondary | ICD-10-CM

## 2020-10-14 DIAGNOSIS — H4921 Sixth [abducent] nerve palsy, right eye: Secondary | ICD-10-CM | POA: Diagnosis not present

## 2020-10-14 DIAGNOSIS — Z6836 Body mass index (BMI) 36.0-36.9, adult: Secondary | ICD-10-CM

## 2020-10-14 DIAGNOSIS — K760 Fatty (change of) liver, not elsewhere classified: Secondary | ICD-10-CM

## 2020-10-14 NOTE — Addendum Note (Signed)
Addended by: Harvie Heck on: 10/14/2020 08:54 AM   Modules accepted: Orders

## 2020-10-14 NOTE — Patient Instructions (Addendum)
-  It was a pleasure seeing you today -I will reach out to Dr. Katy Fitch and follow-up with him as far as your right eye weakness goes.  I will also attempt to place a referral to a neuro-ophthalmologist for your right eye weakness (possibly [facility]) -Please follow-up with me in 1 month so I can recheck your blood pressure while on medication and follow-up with your eye weakness -I will put in a refill for your metoprolol today.  Please call me if any questions or concerns or if you need further refills -I have also put in referral to [facility] to help you with a diet plan

## 2020-10-14 NOTE — Assessment & Plan Note (Signed)
BP Readings from Last 3 Encounters:  10/14/20 (!) 169/69  08/27/20 (!) 176/69  07/15/20 (!) 148/70    Lab Results  Component Value Date   NA 138 07/15/2020   K 3.6 07/15/2020   CREATININE 0.97 07/15/2020    Assessment: Blood pressure control:  Uncontrolled Progress toward BP goal:   Unchanged Comments: Patient states that she is compliant with olmesartan/amlodipine/HCTZ 40/10/25 mg daily as well as metoprolol XL 100 mg daily at home.  Repeat blood pressure with systolics in the 280K  Plan: Medications:  continue current medications Educational resources provided:   Self management tools provided:   Other plans: Patient states that she has not taken her blood pressure pills today.  We will have patient follow-up with me in a month to recheck her blood pressure while on her medication.

## 2020-10-14 NOTE — Assessment & Plan Note (Signed)
-  Patient was noted to have right lateral rectus palsy on exam on her last visit in January -Case discussed with neurology who recommended referral to neuro-ophthalmology -Repeat MRI done at that time showed no evidence of a CVA -Myasthenia gravis panel was within normal limits -Referral to neuro-ophthalmology was placed by Dr. Marva Panda.  Will need to discuss with her as to which physician she referred her to.  Patient may need to follow-up with [facility] -Continue with aspirin 81 mg daily

## 2020-10-14 NOTE — Assessment & Plan Note (Signed)
-  Patient's last A1c in March 2021 was 5.9 consistent with prediabetes -We will repeat her A1c at her next visit -Patient also referred to [facility] for weight loss strategies and diet plan

## 2020-10-14 NOTE — Assessment & Plan Note (Signed)
-  This problem is chronic and stable -Patient's weight remains elevated at 198 pounds -We will refer patient to [facility] for weight loss strategies and a diet plan

## 2020-10-14 NOTE — Assessment & Plan Note (Signed)
-  This problem is chronic and stable -Patient had a low fibrosis score which would not be consistent with NASH -We will refer patient to [facility] to help patient with weight loss strategies and a diet plan -We talked about the importance of trying to lose weight today -No further work-up at this time

## 2020-10-14 NOTE — Progress Notes (Signed)
   Subjective:    Patient ID: Madison Jefferson, female    DOB: 07-01-1951, 70 y.o.   MRN: [id number]  HPI  I have seen and examined this patient.  Patient is here for routine follow-up of her hypertension.  Patient still complains of double vision secondary to her right 6th nerve palsy and has been following with Dr. Katy Fitch for this.  She denies any other complaints at this time and states that she feels well currently.  Review of Systems  Constitutional: Negative.   HENT: Negative.   Eyes: Positive for visual disturbance.       Double vision  Respiratory: Negative.   Cardiovascular: Negative.   Gastrointestinal: Negative.   Musculoskeletal: Negative.   Neurological:       Double vision secondary to right 6th nerve palsy  Psychiatric/Behavioral: Negative.        Objective:   Physical Exam Constitutional:      Appearance: Normal appearance.  HENT:     Head: Normocephalic and atraumatic.  Eyes:     Comments: Patient with right 6th nerve palsy and double vision.  Wearing eye patch today in clinic.  When attempting to do extraocular movements right eye lags when attempting to look to the right  Cardiovascular:     Rate and Rhythm: Normal rate and regular rhythm.     Heart sounds: Normal heart sounds.  Pulmonary:     Breath sounds: Normal breath sounds. No wheezing or rales.  Abdominal:     General: Bowel sounds are normal. There is no distension.     Palpations: Abdomen is soft.     Tenderness: There is no abdominal tenderness.  Musculoskeletal:        General: No swelling or tenderness.     Cervical back: Neck supple.  Neurological:     Mental Status: She is alert and oriented to person, place, and time.  Psychiatric:        Mood and Affect: Mood normal.        Behavior: Behavior normal.           Assessment & Plan:  Please see problem based charting for assessment and plan:

## 2020-10-14 NOTE — Assessment & Plan Note (Signed)
-  This problem is chronic and stable -Patient is compliant with levothyroxine 100 mcg daily -Patient last TSH was within normal limits -We will recheck TSH at next visit -No further work-up at this time

## 2020-11-03 ENCOUNTER — Encounter: Payer: Self-pay | Admitting: *Deleted

## 2020-11-14 ENCOUNTER — Ambulatory Visit (INDEPENDENT_AMBULATORY_CARE_PROVIDER_SITE_OTHER): Payer: Medicare Other | Admitting: Internal Medicine

## 2020-11-14 ENCOUNTER — Other Ambulatory Visit: Payer: Self-pay

## 2020-11-14 ENCOUNTER — Encounter: Payer: Self-pay | Admitting: Internal Medicine

## 2020-11-14 VITALS — BP 134/56 | HR 70 | Temp 98.3°F | Ht 60.0 in | Wt 198.3 lb

## 2020-11-14 DIAGNOSIS — E039 Hypothyroidism, unspecified: Secondary | ICD-10-CM | POA: Diagnosis not present

## 2020-11-14 DIAGNOSIS — H4921 Sixth [abducent] nerve palsy, right eye: Secondary | ICD-10-CM | POA: Diagnosis not present

## 2020-11-14 DIAGNOSIS — I10 Essential (primary) hypertension: Secondary | ICD-10-CM

## 2020-11-14 DIAGNOSIS — R7303 Prediabetes: Secondary | ICD-10-CM

## 2020-11-14 LAB — GLUCOSE, CAPILLARY: Glucose-Capillary: 256 mg/dL — ABNORMAL HIGH (ref 70–99)

## 2020-11-14 LAB — POCT GLYCOSYLATED HEMOGLOBIN (HGB A1C): Hemoglobin A1C: 8.4 % — AB (ref 4.0–5.6)

## 2020-11-14 NOTE — Assessment & Plan Note (Addendum)
Vitals:   11/14/20 0933 11/14/20 0935  BP: (!) 150/61 (!) 134/56  Pulse: 76 70  Temp: 98.3 F (36.8 C)   SpO2: 99%    Patient reports taking her blood pressure medications this morning. Denies any headaches, vision changes, lightheadedness, SOB or chest pain. Plan to continue olmesartan/amlodipine/hctz 40-10-25 mg and metoprolol xl 100 mg daily.

## 2020-11-14 NOTE — Assessment & Plan Note (Signed)
Patient states she is planning to schedule an appt with [facility] to discuss weight loss and diet. She has joined a gym and is planning on resuming walking. Will recheck her Hgb A1c today.

## 2020-11-14 NOTE — Patient Instructions (Signed)
It was a pleasure meeting you today! Your blood pressure reading looked great. I want you to continue taking your medications as prescribed. We will plan to follow up after you see your eye doctor. Also make sure to schedule your appointment with [facility].  We are also checking your thyroid and diabetes blood work today. I will call you if anything is abnormal.

## 2020-11-14 NOTE — Progress Notes (Signed)
Internal Medicine Clinic Attending ° °Case discussed with Dr. Rehman  At the time of the visit.  We reviewed the resident’s history and exam and pertinent patient test results.  I agree with the assessment, diagnosis, and plan of care documented in the resident’s note.  ° °

## 2020-11-14 NOTE — Assessment & Plan Note (Addendum)
She is planning to follow up with Dr. Katy Fitch on 4/6 regarding this. She states her double vision, headache and lightheadedness has all resolved. Denies any trouble with vision at this time. This has been ongoing since January. Neurology recommended referral to neuro-ophthalmology, will see what Dr. Katy Fitch recommends.   - Ophthalmology appt 4/6 - Continue aspirin 81 mg  - Follow up after appt with eye doctor

## 2020-11-14 NOTE — Assessment & Plan Note (Signed)
She is compliant with levothyroxine 100 mcg daily. Last TSH was wnl. Will recheck TSH today.

## 2020-11-14 NOTE — Progress Notes (Signed)
CC: HTN  HPI:  Ms.Dyneshia Jerilynn Mages Demby is a 70 y.o. with a past medical history listed below presenting for follow-up of her hypertension. For details of today's visit and the status of his chronic medical issues please refer to the assessment and plan.   Past Medical History:  Diagnosis Date  . Allergy   . Arthritis   . Asthma   . Atrial fibrillation ([place])    h/o of isolated AF associated with hypothyroidism   . Diverticulosis    cecum  . Diverticulosis    colonoscopy 2002 [facility]  . Fatty liver   . GERD (gastroesophageal reflux disease)   . Hypertension   . Lumbar radiculopathy   . Thyroid disease    hypothyroid  . TIA (transient ischemic attack)    2006  . Urinary incontinence    mixed urge and stress   . Vitamin D deficiency    Review of Systems:   Review of Systems  Constitutional: Negative for chills, fever and malaise/fatigue.  Eyes: Negative for blurred vision, double vision, photophobia, pain and discharge.  Respiratory: Negative for cough, sputum production and shortness of breath.   Cardiovascular: Negative for chest pain and leg swelling.  Gastrointestinal: Negative for abdominal pain, constipation, diarrhea, nausea and vomiting.  Genitourinary: Negative for frequency and urgency.  Musculoskeletal: Negative for falls.  Neurological: Negative for dizziness, weakness and headaches.    Physical Exam:  Vitals:   11/14/20 0933 11/14/20 0935  BP: (!) 150/61 (!) 134/56  Pulse: 76 70  Temp: 98.3 F (36.8 C)   TempSrc: Oral   SpO2: 99%   Weight: 198 lb 4.8 oz (89.9 kg)   Height: 5' (1.524 m)    Physical Exam Constitutional:      General: She is not in acute distress.    Appearance: Normal appearance. She is not ill-appearing.  HENT:     Head: Normocephalic and atraumatic.  Eyes:     General:        Right eye: No discharge.        Left eye: No discharge.     Extraocular Movements: Extraocular movements intact.     Conjunctiva/sclera: Conjunctivae  normal.     Pupils: Pupils are equal, round, and reactive to light.     Comments: right LR palsy with slight disconjugate horizontal gaze to the right; remainder of extraocular eye movements intact  Cardiovascular:     Rate and Rhythm: Normal rate and regular rhythm.     Pulses: Normal pulses.     Heart sounds: Normal heart sounds. No murmur heard. No friction rub. No gallop.   Pulmonary:     Effort: Pulmonary effort is normal. No respiratory distress.     Breath sounds: Normal breath sounds. No wheezing, rhonchi or rales.  Abdominal:     General: Abdomen is flat. Bowel sounds are normal. There is no distension.     Palpations: Abdomen is soft.     Tenderness: There is no abdominal tenderness. There is no guarding.  Musculoskeletal:        General: No swelling or tenderness.     Right lower leg: No edema.     Left lower leg: No edema.  Skin:    General: Skin is warm and dry.  Neurological:     Mental Status: She is alert and oriented to person, place, and time.  Psychiatric:        Mood and Affect: Mood normal.        Behavior: Behavior  normal.        Thought Content: Thought content normal.        Judgment: Judgment normal.     Assessment & Plan:   See Encounters Tab for problem based charting.  Patient discussed with Dr. Jimmye Norman

## 2020-11-15 LAB — TSH: TSH: 3.79 u[IU]/mL (ref 0.450–4.500)

## 2020-11-19 DIAGNOSIS — H0288A Meibomian gland dysfunction right eye, upper and lower eyelids: Secondary | ICD-10-CM | POA: Diagnosis not present

## 2020-11-19 DIAGNOSIS — H25813 Combined forms of age-related cataract, bilateral: Secondary | ICD-10-CM | POA: Diagnosis not present

## 2020-11-19 DIAGNOSIS — H04123 Dry eye syndrome of bilateral lacrimal glands: Secondary | ICD-10-CM | POA: Diagnosis not present

## 2020-11-19 DIAGNOSIS — H4901 Third [oculomotor] nerve palsy, right eye: Secondary | ICD-10-CM | POA: Diagnosis not present

## 2020-11-19 DIAGNOSIS — H0288B Meibomian gland dysfunction left eye, upper and lower eyelids: Secondary | ICD-10-CM | POA: Diagnosis not present

## 2020-12-03 NOTE — Addendum Note (Signed)
Addended by: Hulan Fray on: 12/03/2020 04:56 PM   Modules accepted: Orders

## 2020-12-15 ENCOUNTER — Other Ambulatory Visit: Payer: Self-pay | Admitting: Internal Medicine

## 2020-12-15 DIAGNOSIS — Z1231 Encounter for screening mammogram for malignant neoplasm of breast: Secondary | ICD-10-CM

## 2021-01-05 ENCOUNTER — Ambulatory Visit (INDEPENDENT_AMBULATORY_CARE_PROVIDER_SITE_OTHER): Payer: Medicare Other

## 2021-01-05 ENCOUNTER — Other Ambulatory Visit: Payer: Self-pay

## 2021-01-05 ENCOUNTER — Encounter (HOSPITAL_COMMUNITY): Payer: Self-pay

## 2021-01-05 ENCOUNTER — Ambulatory Visit (HOSPITAL_COMMUNITY)
Admission: EM | Admit: 2021-01-05 | Discharge: 2021-01-05 | Disposition: A | Discharge status: Home / Self Care | Payer: Medicare Other | Attending: Emergency Medicine | Admitting: Emergency Medicine

## 2021-01-05 DIAGNOSIS — M7731 Calcaneal spur, right foot: Secondary | ICD-10-CM | POA: Diagnosis not present

## 2021-01-05 DIAGNOSIS — M7989 Other specified soft tissue disorders: Secondary | ICD-10-CM | POA: Diagnosis not present

## 2021-01-05 DIAGNOSIS — M79671 Pain in right foot: Secondary | ICD-10-CM | POA: Diagnosis not present

## 2021-01-05 MED ORDER — KETOROLAC TROMETHAMINE 30 MG/ML IJ SOLN
INTRAMUSCULAR | Status: AC
  Filled 2021-01-05: qty 1

## 2021-01-05 MED ORDER — KETOROLAC TROMETHAMINE 30 MG/ML IJ SOLN
30.0000 mg | Freq: Once | INTRAMUSCULAR | Status: AC
  Administered 2021-01-05: 30 mg via INTRAMUSCULAR

## 2021-01-05 NOTE — Discharge Instructions (Addendum)
X-ray was negative for injury, showed that your tissues are swollen  Take naproxen twice a day for the next 5 days then can use as needed  Can use tylenol 650 mg in addition if needing extra comfort  Can place heating pad on area in 15 minute intervals   Follow up with orthopedic specialist if symptoms persist

## 2021-01-05 NOTE — ED Provider Notes (Addendum)
[facility]    CSN: [id number] Arrival date & time: 01/05/21  1102      History   Chief Complaint Chief Complaint  Patient presents with  . Foot Pain    HPI Madison Jefferson is a 70 y.o. female.   Patient presents with swelling of the right foot for three days. Began in right toe and has spread to mid foot. ROM intact but painful to bear weight or touch. Taking tylenol which has given some relief. Denies numbness, tingling or injury. Has not occurred prior.   Past Medical History:  Diagnosis Date  . Allergy   . Arthritis   . Asthma   . Atrial fibrillation ([place])    h/o of isolated AF associated with hypothyroidism   . Diverticulosis    cecum  . Diverticulosis    colonoscopy 2002 [facility]  . Fatty liver   . GERD (gastroesophageal reflux disease)   . Hypertension   . Lumbar radiculopathy   . Thyroid disease    hypothyroid  . TIA (transient ischemic attack)    2006  . Urinary incontinence    mixed urge and stress   . Vitamin D deficiency     Patient Active Problem List   Diagnosis Date Noted  . Right abducens nerve palsy 11/15/2019  . Adhesive capsulitis of right shoulder 10/25/2016  . Osteoarthritis of left glenohumeral joint 06/13/2016  . Degenerative arthritis of thumb, left 06/01/2016  . Right knee pain 07/09/2014  . Pre-diabetes 09/20/2013  . Diverticulosis   . Obesity, unspecified 10/12/2012  . Health care maintenance 07/03/2012  . Vitamin D deficiency 12/24/2009  . Fatty liver 10/29/2009  . Allergic rhinitis 10/21/2009  . GERD 10/21/2009  . Asthma 07/23/2009  . Hypothyroidism 09/05/2008  . HYPERTENSION, BENIGN 09/25/2007  . BACK PAIN WITH RADICULOPATHY 09/25/2007    Past Surgical History:  Procedure Laterality Date  . ABLATION     thyroid with radioactive iodine ablation  . CARPAL TUNNEL RELEASE     left wrist  . CHOLECYSTECTOMY    . LEFT HEART CATHETERIZATION WITH CORONARY ANGIOGRAM N/A 01/28/2012   Procedure: LEFT HEART  CATHETERIZATION WITH CORONARY ANGIOGRAM;  Surgeon: Birdie Riddle, MD;  Location: [facility];  Service: Cardiovascular;  Laterality: N/A;  . OTHER SURGICAL HISTORY     carpel tunnel surgery   . OTHER SURGICAL HISTORY     carpel tunnel repair  . RIGHT HEART CATH  2005-Dr. Doylene Canard   minimal CAD; left circumflex dominant with 10-15% proximal lesion;; normal LVF  . TUBAL LIGATION    . VAGINAL HYSTERECTOMY     partial hysterectomy 1983 vaginal bleeding    OB History   No obstetric history on file.      Home Medications    Prior to Admission medications   Medication Sig Start Date End Date Taking? Authorizing Provider  albuterol (VENTOLIN HFA) 108 (90 Base) MCG/ACT inhaler INHALE 1 TO 2 PUFFS INTO THE LUNGS EVERY 6 HOURS AS NEEDED FOR WHEEZING OR SHORTNESS OF BREATH 04/14/20   Aldine Contes, MD  aspirin (ASPIRIN LOW DOSE) 81 MG EC tablet TAKE 1 TABLET(81 MG) BY MOUTH DAILY. SWALLOW WHOLE 07/15/20   Aldine Contes, MD  Cholecalciferol (VITAMIN D3) 10 MCG (400 UNIT) CAPS Take 800 Units by mouth daily. 07/15/20   Aldine Contes, MD  levothyroxine (SYNTHROID) 100 MCG tablet Take 1 tablet (100 mcg total) by mouth daily. 07/15/20   Aldine Contes, MD  loratadine (CLARITIN) 10 MG tablet Take 1 tablet (10  mg total) by mouth daily. 07/15/20   Aldine Contes, MD  metoprolol succinate (TOPROL-XL) 100 MG 24 hr tablet TAKE 1 TABLET BY MOUTH EVERY DAY WITH OR IMMEDIATELY FOLLOWING A MEAL 10/14/20   Aldine Contes, MD  mometasone (NASONEX) 50 MCG/ACT nasal spray Place 2 sprays into the nose daily. 07/15/20   Aldine Contes, MD  Olmesartan-amLODIPine-HCTZ 40-10-25 MG TABS Take 1 tablet by mouth daily. 07/15/20   Aldine Contes, MD    Family History Family History  Problem Relation Age of Onset  . Diabetes Mother   . Stroke Mother   . Cancer Mother        mother <50 at diagnosis (breast); h/o oral cancer  . Diabetes Father   . Heart disease Father   . Stroke Father   .  Hypertension Father   . Diabetes Brother   . Colon cancer Brother   . Diabetes Sister   . Diabetes Paternal Uncle   . Cancer Brother        lung  . Cancer Brother        throat  . Esophageal cancer Neg Hx   . Rectal cancer Neg Hx   . Stomach cancer Neg Hx     Social History Social History   Tobacco Use  . Smoking status: Never Smoker  . Smokeless tobacco: Never Used  Substance Use Topics  . Alcohol use: No    Alcohol/week: 0.0 standard drinks  . Drug use: No     Allergies   Oysters [shellfish allergy]   Review of Systems Review of Systems  Constitutional: Negative.   Respiratory: Negative.   Cardiovascular: Negative.   Musculoskeletal: Positive for joint swelling. Negative for arthralgias, back pain, gait problem, myalgias, neck pain and neck stiffness.  Skin: Negative.   Neurological: Negative.      Physical Exam Triage Vital Signs ED Triage Vitals  Enc Vitals Group     BP 01/05/21 1214 (!) 147/59     Pulse Rate 01/05/21 1214 76     Resp 01/05/21 1214 18     Temp 01/05/21 1214 98.5 F (36.9 C)     Temp Source 01/05/21 1214 Oral     SpO2 01/05/21 1214 100 %     Weight --      Height --      Head Circumference --      Peak Flow --      Pain Score 01/05/21 1211 10     Pain Loc --      Pain Edu? --      Excl. in [place]? --    No data found.  Updated Vital Signs BP (!) 147/59 (BP Location: Left Arm)   Pulse 76   Temp 98.5 F (36.9 C) (Oral)   Resp 18   SpO2 100%   Visual Acuity Right Eye Distance:   Left Eye Distance:   Bilateral Distance:    Right Eye Near:   Left Eye Near:    Bilateral Near:     Physical Exam Constitutional:      Appearance: Normal appearance. She is normal weight.  HENT:     Head: Normocephalic.  Eyes:     Extraocular Movements: Extraocular movements intact.  Pulmonary:     Effort: Pulmonary effort is normal.  Musculoskeletal:     Cervical back: Normal range of motion.     Right foot: Normal range of motion. No  swelling.       Legs:     Comments: Tenderness and swelling  spread over right great toe and midfoot   Skin:    General: Skin is warm and dry.  Neurological:     General: No focal deficit present.     Mental Status: She is alert and oriented to person, place, and time. Mental status is at baseline.  Psychiatric:        Mood and Affect: Mood normal.        Behavior: Behavior normal.        Thought Content: Thought content normal.        Judgment: Judgment normal.      UC Treatments / Results  Labs (all labs ordered are listed, but only abnormal results are displayed) Labs Reviewed - No data to display  EKG   Radiology DG Foot Complete Right  Result Date: 01/05/2021 CLINICAL DATA:  Pain and swelling.  No known injury EXAM: RIGHT FOOT COMPLETE - 3+ VIEW COMPARISON:  None. FINDINGS: There is no evidence of fracture or dislocation. There is no evidence of arthropathy or other focal bone abnormality. Small plantar and posterior calcaneal heel spurs. Dorsal soft tissue swelling is identified overlying the metatarsal bones and metatarsal phalangeal joints. IMPRESSION: 1. No acute bone abnormality. 2. Dorsal soft tissue swelling. 3. Heel spurs. Electronically Signed   By: Kerby Moors M.D.   On: 01/05/2021 13:12    Procedures Procedures (including critical care time)  Medications Ordered in UC Medications  ketorolac (TORADOL) 30 MG/ML injection 30 mg (30 mg Intramuscular Given 01/05/21 1345)    Initial Impression / Assessment and Plan / UC Course  I have reviewed the triage vital signs and the nursing notes.  Pertinent labs & imaging results that were available during my care of the patient were reviewed by me and considered in my medical decision making (see chart for details).  Acute right foot pain   1. Xray- negative 2. Toradol 30 mg IM now 3. Naproxen 500 mg bid, otc tylenol for additional comfort  4.heating pad 15 minute intervals  5. folow up with orthopedics for  persistent symptoms   Final Clinical Impressions(s) / UC Diagnoses   Final diagnoses:  Acute foot pain, right     Discharge Instructions     X-ray was negative for injury, showed that your tissues are swollen  Take naproxen twice a day for the next 5 days then can use as needed  Can use tylenol 650 mg in addition if needing extra comfort  Can place heating pad on area in 15 minute intervals   Follow up with orthopedic specialist if symptoms persist   ED Prescriptions    None     PDMP not reviewed this encounter.   Hans Eden, NP 01/05/21 1447    Hans Eden, NP 01/05/21 1447

## 2021-01-05 NOTE — ED Triage Notes (Signed)
Pt presents with pain and swelling in the right foot x 3 days. Denies any injury. Tylenol gives some relief.

## 2021-01-12 ENCOUNTER — Other Ambulatory Visit: Payer: Self-pay | Admitting: Internal Medicine

## 2021-01-12 DIAGNOSIS — I10 Essential (primary) hypertension: Secondary | ICD-10-CM

## 2021-01-13 ENCOUNTER — Other Ambulatory Visit: Payer: Self-pay | Admitting: Internal Medicine

## 2021-01-13 DIAGNOSIS — I10 Essential (primary) hypertension: Secondary | ICD-10-CM

## 2021-02-05 ENCOUNTER — Ambulatory Visit
Admission: RE | Admit: 2021-02-05 | Discharge: 2021-02-05 | Disposition: A | Discharge status: Home / Self Care | Payer: Medicare Other | Source: Ambulatory Visit | Attending: Internal Medicine | Admitting: Internal Medicine

## 2021-02-05 ENCOUNTER — Other Ambulatory Visit: Payer: Self-pay

## 2021-02-05 DIAGNOSIS — Z1231 Encounter for screening mammogram for malignant neoplasm of breast: Secondary | ICD-10-CM | POA: Diagnosis not present

## 2021-02-23 DIAGNOSIS — H25813 Combined forms of age-related cataract, bilateral: Secondary | ICD-10-CM | POA: Diagnosis not present

## 2021-02-23 DIAGNOSIS — H0288A Meibomian gland dysfunction right eye, upper and lower eyelids: Secondary | ICD-10-CM | POA: Diagnosis not present

## 2021-02-23 DIAGNOSIS — H04123 Dry eye syndrome of bilateral lacrimal glands: Secondary | ICD-10-CM | POA: Diagnosis not present

## 2021-02-23 DIAGNOSIS — H0288B Meibomian gland dysfunction left eye, upper and lower eyelids: Secondary | ICD-10-CM | POA: Diagnosis not present

## 2021-02-23 LAB — HM DIABETES EYE EXAM

## 2021-04-05 ENCOUNTER — Other Ambulatory Visit: Payer: Self-pay | Admitting: Internal Medicine

## 2021-04-05 DIAGNOSIS — H4922 Sixth [abducent] nerve palsy, left eye: Secondary | ICD-10-CM

## 2021-04-07 ENCOUNTER — Ambulatory Visit
Admission: RE | Admit: 2021-04-07 | Discharge: 2021-04-07 | Disposition: A | Discharge status: Home / Self Care | Payer: Medicare Other | Source: Ambulatory Visit | Attending: Neurology | Admitting: Neurology

## 2021-04-07 DIAGNOSIS — I671 Cerebral aneurysm, nonruptured: Secondary | ICD-10-CM | POA: Diagnosis not present

## 2021-04-07 DIAGNOSIS — I672 Cerebral atherosclerosis: Secondary | ICD-10-CM | POA: Diagnosis not present

## 2021-04-07 DIAGNOSIS — I6523 Occlusion and stenosis of bilateral carotid arteries: Secondary | ICD-10-CM | POA: Diagnosis not present

## 2021-04-07 MED ORDER — IOPAMIDOL (ISOVUE-370) INJECTION 76%
75.0000 mL | Freq: Once | INTRAVENOUS | Status: AC | PRN
  Administered 2021-04-07: 75 mL via INTRAVENOUS

## 2021-04-09 NOTE — Progress Notes (Signed)
Patient advised of CTA results, verbally understood results.

## 2021-04-12 ENCOUNTER — Other Ambulatory Visit: Payer: Self-pay | Admitting: Internal Medicine

## 2021-04-12 DIAGNOSIS — E039 Hypothyroidism, unspecified: Secondary | ICD-10-CM

## 2021-04-14 NOTE — Progress Notes (Signed)
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  Repeat CTA of head in one year.    Subjective:  Verl Dicker. Keim is a 70 year old right-handed black female with hypothyroidism, a fib and history of ischemic left abducens nerve palsy who follows up for questionable tiny cerebral aneurysm vs infundibulum   UPDATE: Repeat CTA of head on 04/07/2021 personally reviewed again demonstrated stable 1-2 mm small aneurysm vs infundibulum arising from the paraclinoid right ICA.  She is feeling well.  No issues.   HISTORY: She had her first COVID vaccine shot on 10/14/2019.  About a couple of days later, she began noticing horizontal double vision with certain eye movements, such as while driving or sometimes watching TV.  She also had left ocular pain as well.  No headache or decreased vision.  The eye pain lasted about 3 or 4 weeks and has resolved.  She still notes some photosensitivity when she is outside in the sun.  Double vision is still present with certain gaze.  She did receive her second COVID vaccine shot on 11/07/2019.   MRI of brain without contrast on 11/15/2019 personally reviewed showed mild chronic small vessel ischemic changes in the cerebral white matter but otherwise unremarkable (no brainstem lesion such as stroke or abnormality of cavernous sinus).  MRI of orbits with and without contrast performed on 11/20/2019 personally reviewed showed subtle increased T2 signal in the left lateral rectus muscle, possibly related to denervation.  Labs from March showed TSH 1.330 and Hgb A1c 5.9.  Labs from 12/04/2019 demonstrated sed rate 19, ACE 20, negative CRP, negative SSA/SSB antibodies and negative ANCA.  CTA of head on 12/19/2019 showed 1-2 mm aneurysm vs infundibulum arising from the paraclinoid right ICA  but no explanation for symptoms.  PAST MEDICAL HISTORY: Past Medical History:  Diagnosis Date   Allergy    Arthritis    Asthma    Atrial fibrillation ([place])    h/o of isolated AF associated with hypothyroidism    Diverticulosis    cecum   Diverticulosis    colonoscopy 2002 [facility]   Fatty liver    GERD (gastroesophageal reflux disease)    Hypertension    Lumbar radiculopathy    Thyroid disease    hypothyroid   TIA (transient ischemic attack)    2006   Urinary incontinence    mixed urge and stress    Vitamin D deficiency     MEDICATIONS: Current Outpatient Medications on File Prior to Visit  Medication Sig Dispense Refill   albuterol (VENTOLIN HFA) 108 (90 Base) MCG/ACT inhaler INHALE 1 TO 2 PUFFS INTO THE LUNGS EVERY 6 HOURS AS NEEDED FOR WHEEZING OR SHORTNESS OF BREATH 18 g 1   aspirin (ASPIRIN LOW DOSE) 81 MG EC tablet TAKE 1 TABLET(81 MG) BY MOUTH DAILY. SWALLOW WHOLE 90 tablet 1   Cholecalciferol (VITAMIN D3) 10 MCG (400 UNIT) CAPS Take 800 Units by mouth daily. 60 capsule 1   levothyroxine (SYNTHROID) 100 MCG tablet Take 1 tablet (100 mcg total) by mouth daily. 90 tablet 2   loratadine (CLARITIN) 10 MG tablet Take 1 tablet (10 mg total) by mouth daily. 90 tablet 1   metoprolol succinate (TOPROL-XL) 100 MG 24 hr tablet TAKE 1 TABLET BY MOUTH EVERY DAY WITH OR IMMEDIATELY FOLLOWING  A MEAL 90 tablet 1   mometasone (NASONEX) 50 MCG/ACT nasal spray Place 2 sprays into the nose daily. 17 g 1   Olmesartan-amLODIPine-HCTZ 40-10-25 MG TABS TAKE 1 TABLET BY MOUTH DAILY 90 tablet 1   No current facility-administered medications on file prior to visit.    ALLERGIES: Allergies  Allergen Reactions   Oysters [Shellfish Allergy] Rash    FAMILY HISTORY: Family History  Problem Relation Age of Onset   Diabetes Mother    Stroke Mother    Cancer Mother        mother <50 at diagnosis (breast); h/o oral cancer   Diabetes Father    Heart disease Father    Stroke Father     Hypertension Father    Diabetes Brother    Colon cancer Brother    Diabetes Sister    Diabetes Paternal Uncle    Cancer Brother        lung   Cancer Brother        throat   Esophageal cancer Neg Hx    Rectal cancer Neg Hx    Stomach cancer Neg Hx       Objective:  Blood pressure (!) 130/58, pulse 80, height '5\' 1"'$  (1.549 m), weight 195 lb (88.5 kg), SpO2 100 %. General: No acute distress.  Patient appears well-groomed.   Head:  Normocephalic/atraumatic Eyes:  Fundi examined but not visualized Neck: supple, no paraspinal tenderness, full range of motion Heart:  Regular rate and rhythm Lungs:  Clear to auscultation bilaterally Back: No paraspinal tenderness Neurological Exam: alert and oriented to person, place, and time.  Speech fluent and not dysarthric, language intact.  CN II-XII intact. Bulk and tone normal, muscle strength 5/5 throughout.  Sensation to light touch intact.  Deep tendon reflexes 2+ throughout, toes downgoing.  Finger to nose testing intact.  Gait normal, Romberg negative.   Metta Clines, DO  CC: Aldine Contes, MD

## 2021-04-15 ENCOUNTER — Ambulatory Visit: Payer: Medicare Other | Admitting: Neurology

## 2021-04-15 ENCOUNTER — Other Ambulatory Visit: Payer: Self-pay

## 2021-04-15 ENCOUNTER — Encounter: Payer: Self-pay | Admitting: Neurology

## 2021-04-15 VITALS — BP 130/58 | HR 80 | Ht 61.0 in | Wt 195.0 lb

## 2021-04-15 DIAGNOSIS — I671 Cerebral aneurysm, nonruptured: Secondary | ICD-10-CM | POA: Diagnosis not present

## 2021-04-15 NOTE — Patient Instructions (Signed)
Aneurysm looks small and stable.  No concerns.  Would repeat in one year. Follow up afterwards.

## 2021-04-23 DIAGNOSIS — H25812 Combined forms of age-related cataract, left eye: Secondary | ICD-10-CM | POA: Diagnosis not present

## 2021-04-23 DIAGNOSIS — H268 Other specified cataract: Secondary | ICD-10-CM | POA: Diagnosis not present

## 2021-06-23 ENCOUNTER — Ambulatory Visit (INDEPENDENT_AMBULATORY_CARE_PROVIDER_SITE_OTHER): Payer: Medicare Other | Admitting: Internal Medicine

## 2021-06-23 VITALS — BP 147/61 | HR 79 | Temp 98.4°F | Ht 61.0 in | Wt 190.4 lb

## 2021-06-23 DIAGNOSIS — E1169 Type 2 diabetes mellitus with other specified complication: Secondary | ICD-10-CM | POA: Diagnosis not present

## 2021-06-23 DIAGNOSIS — I10 Essential (primary) hypertension: Secondary | ICD-10-CM

## 2021-06-23 DIAGNOSIS — E039 Hypothyroidism, unspecified: Secondary | ICD-10-CM | POA: Diagnosis not present

## 2021-06-23 DIAGNOSIS — E559 Vitamin D deficiency, unspecified: Secondary | ICD-10-CM | POA: Diagnosis not present

## 2021-06-23 DIAGNOSIS — R1310 Dysphagia, unspecified: Secondary | ICD-10-CM

## 2021-06-23 DIAGNOSIS — Z0000 Encounter for general adult medical examination without abnormal findings: Secondary | ICD-10-CM

## 2021-06-23 HISTORY — DX: Dysphagia, unspecified: R13.10

## 2021-06-23 LAB — POCT GLYCOSYLATED HEMOGLOBIN (HGB A1C): Hemoglobin A1C: 10.3 % — AB (ref 4.0–5.6)

## 2021-06-23 LAB — GLUCOSE, CAPILLARY: Glucose-Capillary: 409 mg/dL — ABNORMAL HIGH (ref 70–99)

## 2021-06-23 MED ORDER — METFORMIN HCL ER 500 MG PO TB24: ORAL_TABLET | ORAL | 0 refills | Status: DC

## 2021-06-23 NOTE — Assessment & Plan Note (Addendum)
Blood pressure 147/61 today. She reports taking all of her medications as prescribed. Denies vision changes or headache. Endorses some occasional lightheadedness or weakness that improves when she sits down.  Plan: - Continue olmesartan/amlodipine/HCTZ 40-10-25mg  - Continue metoporolol succinate XL 100mg    Addendum: -BMP was within normal limits except for mildly elevated anion gap and elevated blood sugar of 400.  No acidosis noted.  We will recheck at follow-up visit

## 2021-06-23 NOTE — Assessment & Plan Note (Addendum)
Previous TSH checked this year was normal. She continues to take her levothyroxine 128mcg

## 2021-06-23 NOTE — Patient Instructions (Signed)
Thank you, Ms.Florestine Avers for allowing [place] to provide your care today. Today we discussed managing your diabetes, the weak spells you have been experiencing, and your difficulty swallowing.    I have ordered the following labs for you:  Lab Orders         BMP8+Anion Gap         Lipid Profile         Vitamin D (25 hydroxy)         POC Hbg A1C      Tests discussed today:  We discussed the need for a swallowing study in the future. We will schedule you for a barium swallow study.  Referrals ordered today:   Referral Orders  No referral(s) requested today     I have ordered the following medication/changed the following medications:   Stop the following medications: There are no discontinued medications.   Start the following medications: Meds ordered this encounter  Medications   metFORMIN (GLUCOPHAGE-XR) 500 MG 24 hr tablet    Sig: Take 1 tablet (500 mg total) by mouth daily with breakfast for 7 days, THEN 1 tablet (500 mg total) 2 (two) times daily with a meal.    Dispense:  187 tablet    Refill:  0     Follow up:  1 month    Remember: If you experience any concerning weakness or numbness that is worsening and persists, please call us immediately and go to the Emergency Room for further work-up.  Additionally, you experience any crushing chest pain, please call 911 or go the nearest Emergency Room.  Should you have any questions or concerns please call the internal medicine clinic at [phone].

## 2021-06-23 NOTE — Assessment & Plan Note (Signed)
This is a new presentation today. She reports chest pain or discomfort right after eating, with a feeling that something is stuck in her throat. This usually resolves after drinking water. Given symptomology of dysphagia, will refer for barium swallow study and follow up.

## 2021-06-23 NOTE — Assessment & Plan Note (Signed)
Patient reports she received her flu shot at the pharmacy 1 week ago.

## 2021-06-23 NOTE — Progress Notes (Signed)
  Subjective:     Patient ID: Madison Jefferson, female   DOB: Aug 17, 1950, 70 y.o.   MRN: [id number]  Madison Jefferson is a 70 y/o who presents to the clinic for regular follow-up. During her last visit in April, her A1c measured 8.4, which represented a progression of her prediabetes to diabetes. However, this was not communicated to her and was discussed today. In addition to this, she complains of some transient weakness, which occurs in the shower or when she is cooking something in the kitchen. She describes this as a feeling of lightheadedness that resolves when she sits down or lays down. She mentions a feeling of numbness in her right arm on one occasion during these episodes. This does not occur when standing from a seated position or from a position of lying down.   She also reports a feeling of chest discomfort/pain that only occurs with eating. She describes this as a feeling that something is stuck in her throat. As a result, she has begun trying to change her eating habits by eating less red meat and less pork.   This morning she woke up with numbness in the fingertips of her left hand. She wonders if this is because she slept on her arm, as this occasionally happens but usually resolves after some time. She says today it has not resolved, which is unusual. She has had carpal tunnel surgery on her left hand in the past, and has no issues since. Prior to the surgery, her whole left hand was in pain, and her symptoms were different than at today's presentation.      Review of Systems  Constitutional:  Negative for chills and fever.  Eyes:  Negative for pain and visual disturbance.  Cardiovascular:  Positive for chest pain.       Described as "discomfort right after eating." Not burning in nature and feels like something is stuck. This resolves after drinking water.   Gastrointestinal:  Negative for abdominal pain, constipation, diarrhea, nausea and vomiting.  Endocrine: Negative for polyphagia.   Neurological:  Positive for light-headedness and numbness.      Objective:   Physical Exam Constitutional:      Appearance: Normal appearance.  HENT:     Head: Normocephalic and atraumatic.  Eyes:     Extraocular Movements: Extraocular movements intact.  Cardiovascular:     Rate and Rhythm: Normal rate and regular rhythm.     Pulses: Normal pulses.     Heart sounds: Normal heart sounds.  Pulmonary:     Effort: Pulmonary effort is normal.     Breath sounds: Normal breath sounds.  Abdominal:     General: Bowel sounds are normal. There is no distension.     Palpations: Abdomen is soft.     Tenderness: There is no abdominal tenderness.  Musculoskeletal:     Right lower leg: Edema present.     Left lower leg: Edema present.     Comments: Trace edema bilaterally, which is her baseline.  Neurological:     General: No focal deficit present.     Mental Status: She is alert.       Assessment & Plan:     See problem-based charting.

## 2021-06-23 NOTE — Assessment & Plan Note (Addendum)
Her vitamin D deficiency is chronic and stable. We will re-check a vitamin D level today.  Addendum: -Vitamin D level is within normal limits.  We will continue vitamin D supplementation for now given her level remains borderline

## 2021-06-23 NOTE — Assessment & Plan Note (Addendum)
A1c at last visit in April was 8.4. Patient was unaware of this, and discussed progression of prediabetes to diabetes. BCG measured in office today was 409. Her report of numbness in the fingertips of her left hand may represent neuropathic symptoms related to her diabetes. Regardless, will re-check an A1c today and begin metformin 500mg  with plan for expedited titration, dependent on patient tolerance. Will also obtain BMP today, lipids, and refer to Nutrition and Diabetes services.  Plan: - BMP - Lipid profile - Check A1c - Referral to Nutrition and Diabetes Services.  Addendum: -Patient noted to have an elevated A1c of over 10.  We will start the patient on Jardiance in addition to her metformin and have her follow-up with me on December 6 for further management.  Discussed with patient over phone.  She expressed understanding and is in agreement with plan. -Patient has an elevated 10-year ASCVD risk score of 24%.  We will need to initiate a statin.  We will discuss this with patient in person at her next visit

## 2021-06-24 LAB — LIPID PANEL
Chol/HDL Ratio: 3.9 ratio (ref 0.0–4.4)
Cholesterol, Total: 137 mg/dL (ref 100–199)
HDL: 35 mg/dL — ABNORMAL LOW (ref >39)
LDL Chol Calc (NIH): 72 mg/dL (ref 0–99)
Triglycerides: 179 mg/dL — ABNORMAL HIGH (ref 0–149)
VLDL Cholesterol Cal: 30 mg/dL (ref 5–40)

## 2021-06-24 LAB — BMP8+ANION GAP
Anion Gap: 20 mmol/L — ABNORMAL HIGH (ref 10.0–18.0)
BUN/Creatinine Ratio: 18 (ref 12–28)
BUN: 16 mg/dL (ref 8–27)
CO2: 21 mmol/L (ref 20–29)
Calcium: 9 mg/dL (ref 8.7–10.3)
Chloride: 96 mmol/L (ref 96–106)
Creatinine, Ser: 0.87 mg/dL (ref 0.57–1.00)
Glucose: 413 mg/dL — ABNORMAL HIGH (ref 70–99)
Potassium: 4.2 mmol/L (ref 3.5–5.2)
Sodium: 137 mmol/L (ref 134–144)
eGFR: 71 mL/min/{1.73_m2} (ref >59)

## 2021-06-24 LAB — VITAMIN D 25 HYDROXY (VIT D DEFICIENCY, FRACTURES): Vit D, 25-Hydroxy: 36.4 ng/mL (ref 30.0–100.0)

## 2021-06-24 MED ORDER — EMPAGLIFLOZIN 10 MG PO TABS: 10.0000 mg | ORAL_TABLET | Freq: Every day | ORAL | 1 refills | Status: DC

## 2021-06-24 NOTE — Addendum Note (Signed)
Addended by: Aldine Contes on: 06/24/2021 09:47 AM   Modules accepted: Orders

## 2021-06-25 ENCOUNTER — Telehealth (HOSPITAL_COMMUNITY): Payer: Self-pay | Admitting: *Deleted

## 2021-06-25 NOTE — Telephone Encounter (Signed)
Attempted to contact patient to schedule OP MBS. Left VM. RKEEL 

## 2021-06-30 ENCOUNTER — Telehealth: Payer: Self-pay | Admitting: Dietician

## 2021-06-30 DIAGNOSIS — E1169 Type 2 diabetes mellitus with other specified complication: Secondary | ICD-10-CM

## 2021-06-30 MED ORDER — ACCU-CHEK SOFTCLIX LANCETS MISC: 3 refills | Status: DC

## 2021-06-30 MED ORDER — ACCU-CHEK GUIDE W/DEVICE KIT: PACK | 1 refills | Status: DC

## 2021-06-30 MED ORDER — ACCU-CHEK GUIDE VI STRP: ORAL_STRIP | 3 refills | Status: DC

## 2021-06-30 NOTE — Telephone Encounter (Signed)
Ms. Broski calls asking for a meter to check her blood sugar because the new diabetes medicine ( for her newly diagnosed diabetes) is making her weak and tired. She says her relative who is in healthcare suggested she check her blood sugar. Her symptoms have been going on since she started the new diabetes medicines last week. It mostly happens in the early mornings. She is drinking a lot of water. She wonders if her new diabetes medicine is too strong. We discussed that her doses of the two medicines she was started on last week are the lowest and that she may not want to increase the metformin until she feels better. She states that her sister has diabetes and can help her learn how to use the meter. I told her that I would route this note to Dr. Dareen Piano and also request a meter and supplies on her behalf to be sent to her pharmacy. Asked her to bring her meter to hr visits on 07/21/21

## 2021-07-01 ENCOUNTER — Ambulatory Visit (INDEPENDENT_AMBULATORY_CARE_PROVIDER_SITE_OTHER): Payer: Medicare Other | Admitting: Internal Medicine

## 2021-07-01 DIAGNOSIS — E1169 Type 2 diabetes mellitus with other specified complication: Secondary | ICD-10-CM

## 2021-07-01 NOTE — Progress Notes (Signed)
  [facility] Residency Telephone Encounter Continuity Care Appointment  HPI:  This telephone encounter was created for [name] on 07/01/2021 for the following purpose/cc diabetes medication concerns.  At recent office visit, patient's A1c was noted to be elevated > 10%. Patient was started on Metformin and Jardiance.  Since starting these medications, she has been experiencing increasing generalized weakness with slight dizziness that is worse in the morning.  She endorses significant urinary frequency but otherwise denies nausea, vomiting, diarrhea, abdominal pain.  She has been trying to keep up with water intake but is unsure if she is matching how much she is urinating.   Past Medical History:  Past Medical History:  Diagnosis Date   Allergy    Arthritis    Asthma    Atrial fibrillation ([place])    h/o of isolated AF associated with hypothyroidism    Diverticulosis    cecum   Diverticulosis    colonoscopy 2002 [place]   Fatty liver    GERD (gastroesophageal reflux disease)    Hypertension    Lumbar radiculopathy    Thyroid disease    hypothyroid   TIA (transient ischemic attack)    2006   Urinary incontinence    mixed urge and stress    Vitamin D deficiency      ROS:  See above for details   Assessment / Plan / Recommendations:  Please see A&P under problem oriented charting for assessment of the patient's acute and chronic medical conditions.  As always, pt is advised that if symptoms worsen or new symptoms arise, they should go to an urgent care facility or to to ER for further evaluation.   Consent and Medical Decision Making:  Patient discussed with Dr. Jimmye Norman This is a telephone encounter between Madison Jefferson and Jose Persia on 07/01/2021 for diabetes medication concerns. The visit was conducted with the patient located at home and Jose Persia at [facility]. The patient's identity was confirmed using their DOB and current address. The  patient has consented to being evaluated through a telephone encounter and understands the associated risks (an examination cannot be done and the patient may need to come in for an appointment) / benefits (allows the patient to remain at home, decreasing exposure to coronavirus). I personally spent 9 minutes on medical discussion.

## 2021-07-01 NOTE — Assessment & Plan Note (Signed)
Patient endorsing increasing generalized weakness and dizziness since starting metformin and Jardiance at the same time.  I suspect that this is secondary to the diuretic effect of Jardiance in the setting of significantly elevated sugars.  We will discontinue Jardiance for now and work on titrating metformin to maximum dose.  Patient is in agreement and expressed understanding.  - Discontinue Jardiance - Increase metformin to 1 tablet twice daily.  Provided titration instructions via telephone and via mail - Requested patient give [place] a call in the next several days let us know how she is feeling.  If she continues to experience generalized weakness and dizziness, will need an in person appointment to ensure she is not orthostatic.

## 2021-07-02 ENCOUNTER — Other Ambulatory Visit (HOSPITAL_COMMUNITY): Payer: Self-pay

## 2021-07-02 ENCOUNTER — Telehealth (HOSPITAL_COMMUNITY): Payer: Self-pay

## 2021-07-02 DIAGNOSIS — R1310 Dysphagia, unspecified: Secondary | ICD-10-CM

## 2021-07-06 NOTE — Progress Notes (Signed)
Internal Medicine Clinic Attending  Case discussed with Dr. Charleen Kirks  At the time of the visit.  We reviewed the resident's history . I agree with the assessment, diagnosis, and plan of care documented in the resident's note.

## 2021-07-07 ENCOUNTER — Other Ambulatory Visit: Payer: Self-pay

## 2021-07-07 ENCOUNTER — Ambulatory Visit (HOSPITAL_COMMUNITY)
Admission: RE | Admit: 2021-07-07 | Discharge: 2021-07-07 | Disposition: A | Discharge status: Home / Self Care | Payer: Medicare Other | Source: Ambulatory Visit | Attending: Internal Medicine | Admitting: Internal Medicine

## 2021-07-07 DIAGNOSIS — R1310 Dysphagia, unspecified: Secondary | ICD-10-CM | POA: Insufficient documentation

## 2021-07-11 ENCOUNTER — Other Ambulatory Visit: Payer: Self-pay | Admitting: Internal Medicine

## 2021-07-11 DIAGNOSIS — I10 Essential (primary) hypertension: Secondary | ICD-10-CM

## 2021-07-21 ENCOUNTER — Ambulatory Visit (INDEPENDENT_AMBULATORY_CARE_PROVIDER_SITE_OTHER): Payer: Medicare Other | Admitting: Internal Medicine

## 2021-07-21 ENCOUNTER — Ambulatory Visit (INDEPENDENT_AMBULATORY_CARE_PROVIDER_SITE_OTHER): Payer: Medicare Other | Admitting: Dietician

## 2021-07-21 ENCOUNTER — Encounter: Payer: Self-pay | Admitting: Dietician

## 2021-07-21 DIAGNOSIS — K760 Fatty (change of) liver, not elsewhere classified: Secondary | ICD-10-CM | POA: Diagnosis not present

## 2021-07-21 DIAGNOSIS — R1310 Dysphagia, unspecified: Secondary | ICD-10-CM | POA: Diagnosis not present

## 2021-07-21 DIAGNOSIS — E1169 Type 2 diabetes mellitus with other specified complication: Secondary | ICD-10-CM

## 2021-07-21 DIAGNOSIS — Z6836 Body mass index (BMI) 36.0-36.9, adult: Secondary | ICD-10-CM

## 2021-07-21 DIAGNOSIS — I10 Essential (primary) hypertension: Secondary | ICD-10-CM | POA: Diagnosis not present

## 2021-07-21 DIAGNOSIS — Z0000 Encounter for general adult medical examination without abnormal findings: Secondary | ICD-10-CM

## 2021-07-21 DIAGNOSIS — E66812 Obesity, class 2: Secondary | ICD-10-CM

## 2021-07-21 DIAGNOSIS — E669 Obesity, unspecified: Secondary | ICD-10-CM

## 2021-07-21 LAB — GLUCOSE, CAPILLARY: Glucose-Capillary: 126 mg/dL — ABNORMAL HIGH (ref 70–99)

## 2021-07-21 MED ORDER — METFORMIN HCL ER 500 MG PO TB24
1000.0000 mg | ORAL_TABLET | Freq: Every day | ORAL | 1 refills | Status: DC
Start: 2021-07-21 — End: 2021-08-26

## 2021-07-21 NOTE — Patient Instructions (Addendum)
-  It was a pleasure seeing you today.  Have a great holiday season! -Your blood pressure was a little high today.  If it remains high at your follow-up visit we will need to add an additional medication to help control it -Please go up on your metformin to 2 pills in the morning and 2 pills at night -We will check your blood work at your next visit -Please call me if you have any questions or concerns or if you need any refills on your medications

## 2021-07-21 NOTE — Patient Instructions (Addendum)
High-Fiber Eating Plan Fiber, also called dietary fiber, is a type of carbohydrate. It is found foods such as fruits, vegetables, whole grains, and beans. A high-fiber diet can have many health benefits. Your health care provider may recommend a high-fiber diet to help: Prevent constipation. Fiber can make your bowel movements more regular. Lower your cholesterol. Relieve the following conditions: Inflammation of veins in the anus (hemorrhoids). Inflammation of specific areas of the digestive tract (uncomplicated diverticulosis). A problem of the large intestine, also called the colon, that sometimes causes pain and diarrhea (irritable bowel syndrome, or IBS). Prevent overeating as part of a weight-loss plan. Prevent heart disease, type 2 diabetes, and certain cancers. What are tips for following this plan? Reading food labels  Check the nutrition facts label on food products for the amount of dietary fiber. Choose foods that have 5 grams of fiber or more per serving. The goals for recommended daily fiber intake include: Men (age 50 or younger): 34-38 g. Men (over age 50): 28-34 g. Women (age 50 or younger): 25-28 g. Women (over age 50): 22-25 g. Your daily fiber goal is _____________ g. Shopping Choose whole fruits and vegetables instead of processed forms, such as apple juice or applesauce. Choose a wide variety of high-fiber foods such as avocados, lentils, oats, and kidney beans. Read the nutrition facts label of the foods you choose. Be aware of foods with added fiber. These foods often have high sugar and sodium amounts per serving. Cooking Use whole-grain flour for baking and cooking. Cook with brown rice instead of white rice. Meal planning Start the day with a breakfast that is high in fiber, such as a cereal that contains 5 g of fiber or more per serving. Eat breads and cereals that are made with whole-grain flour instead of refined flour or white flour. Eat brown rice, bulgur  wheat, or millet instead of white rice. Use beans in place of meat in soups, salads, and pasta dishes. Be sure that half of the grains you eat each day are whole grains. General information You can get the recommended daily intake of dietary fiber by: Eating a variety of fruits, vegetables, grains, nuts, and beans. Taking a fiber supplement if you are not able to take in enough fiber in your diet. It is better to get fiber through food than from a supplement. Gradually increase how much fiber you consume. If you increase your intake of dietary fiber too quickly, you may have bloating, cramping, or gas. Drink plenty of water to help you digest fiber. Choose high-fiber snacks, such as berries, raw vegetables, nuts, and popcorn. What foods should I eat? Fruits Berries. Pears. Apples. Oranges. Avocado. Prunes and raisins. Dried figs. Vegetables Sweet potatoes. Spinach. Kale. Artichokes. Cabbage. Broccoli. Cauliflower. Green peas. Carrots. Squash. Grains Whole-grain breads. Multigrain cereal. Oats and oatmeal. Brown rice. Barley. Bulgur wheat. Millet. Quinoa. Bran muffins. Popcorn. Rye wafer crackers. Meats and other proteins Navy beans, kidney beans, and pinto beans. Soybeans. Split peas. Lentils. Nuts and seeds. Dairy Fiber-fortified yogurt. Beverages Fiber-fortified soy milk. Fiber-fortified orange juice. Other foods Fiber bars. The items listed above may not be a complete list of recommended foods and beverages. Contact a dietitian for more information. What foods should I avoid? Fruits Fruit juice. Cooked, strained fruit. Vegetables Fried potatoes. Canned vegetables. Well-cooked vegetables. Grains White bread. Pasta made with refined flour. White rice. Meats and other proteins Fatty cuts of meat. Fried chicken or fried fish. Dairy Milk. Yogurt. Cream cheese. Sour cream. Fats and   oils Butters. Beverages Soft drinks. Other foods Cakes and pastries. The items listed above may  not be a complete list of foods and beverages to avoid. Talk with your dietitian about what choices are best for you. Summary Fiber is a type of carbohydrate. It is found in foods such as fruits, vegetables, whole grains, and beans. A high-fiber diet has many benefits. It can help to prevent constipation, lower blood cholesterol, aid weight loss, and reduce your risk of heart disease, diabetes, and certain cancers. Increase your intake of fiber gradually. Increasing fiber too quickly may cause cramping, bloating, and gas. Drink plenty of water while you increase the amount of fiber you consume. The best sources of fiber include whole fruits and vegetables, whole grains, nuts, seeds, and beans.    We agreed to follow up on the same day you see Dr. Dareen Piano next visit or in 4-6 weeks whichever you prefer. What we talked about today-  1- What is diabetes 2- Foods I should eat to keep my blood sugar under control- limit portions of starchy foods, more protein and veggies 3- Checking your blood sugar- please bring your meter to all visits- I suggest checking 1x/week ior when you do not feel right. Dr. Dareen Piano may change this recommendation if he wants more information.  4- Referring you to the exercise program you should get a call from them Butch Penny [phone]  3-

## 2021-07-21 NOTE — Progress Notes (Signed)
Diabetes Self-Management Education  Visit Type: First/Initial  Appt. Start Time: 1418 Appt. End Time: 1518  07/21/2021  [name], identified by name and date of birth, is a 70 y.o. female with a diagnosis of Diabetes: Type 2.   ASSESSMENT Lab Results  Component Value Date   HGBA1C 10.3 (A) 06/23/2021   HGBA1C 8.4 (A) 11/14/2020   HGBA1C 5.9 (A) 10/16/2019   HGBA1C 6.1 (A) 04/24/2019   HGBA1C 5.6 04/18/2018  States she has to strain to have a BM and that Constipation is a problem, wonders if the diabetes medicine is causing it or making it works. Recommended  fiber 15-25 grams a day, fluid and activity she agrees to a PREP referral. She is not interested in classes and has trouble with transportation. Appropriate for 1:1 dsmes.  Flu at [facility] [address] Weight 190 lb 9.6 oz (86.5 kg). Body mass index is 36.01 kg/m.   Diabetes Self-Management Education - 07/21/21 1600       Visit Information   Visit Type First/Initial      Initial Visit   Diabetes Type Type 2    Are you currently following a meal plan? No    Are you taking your medications as prescribed? Yes    Date Diagnosed 11/2020      Health Coping   How would you rate your overall health? Good      Psychosocial Assessment   Patient Belief/Attitude about Diabetes Motivated to manage diabetes   a bit fearful and sad with memory of her mother who was on dialysis, borther and sister also have diabetes   Self-care barriers Lack of material resources    Self-management support Doctor's office;Family;CDE visits    Patient Concerns Nutrition/Meal planning;Medication;Monitoring;Glycemic Control;Support    Special Needs None    Preferred Learning Style No preference indicated    Learning Readiness Ready    How often do you need to have someone help you when you read instructions, pamphlets, or other written materials from your doctor or pharmacy? 2 - Rarely    What is the last grade level you  completed in school? 12      Pre-Education Assessment   Patient understands the diabetes disease and treatment process. Needs Instruction    Patient understands incorporating nutritional management into lifestyle. Needs Instruction    Patient undertands incorporating physical activity into lifestyle. Needs Instruction    Patient understands using medications safely. Demonstrates understanding / competency    Patient understands monitoring blood glucose, interpreting and using results Needs Instruction    Patient understands prevention, detection, and treatment of acute complications. Needs Instruction    Patient understands prevention, detection, and treatment of chronic complications. Needs Instruction    Patient understands how to develop strategies to address psychosocial issues. Demonstrates understanding / competency    Patient understands how to develop strategies to promote health/change behavior. Needs Instruction      Complications   Last HgB A1C per patient/outside source 10.3 %    How often do you check your blood sugar? 0 times/day (not testing)    Fasting Blood glucose range (mg/dL) --   unknown   Postprandial Blood glucose range (mg/dL) 70-129    Number of hypoglycemic episodes per month 0    Number of hyperglycemic episodes per week 0    Have you had a dilated eye exam in the past 12 months? No    Have you had a dental exam in the past 12 months?  No    Are you checking your feet? No      Exercise   Exercise Type ADL's      Patient Education   Previous Diabetes Education No    Disease state  Factors that contribute to the development of diabetes;Explored patient's options for treatment of their diabetes    Nutrition management  Role of diet in the treatment of diabetes and the relationship between the three main macronutrients and blood glucose level    Physical activity and exercise  Role of exercise on diabetes management, blood pressure control and cardiac health.     Medications Reviewed patients medication for diabetes, action, purpose, timing of dose and side effects.    Monitoring Purpose and frequency of SMBG.    Acute complications Discussed and identified patients' treatment of hyperglycemia.    Preconception care Other (comment)   na     Outcomes   Expected Outcomes Demonstrated interest in learning. Expect positive outcomes    Future DMSE 4-6 wks    Program Status Not Completed   plan to finish assessment at future visit, patient did not  seem ready to set goals today.            Individualized Plan for Diabetes Self-Management Training:   Learning Objective:  Patient will have a greater understanding of diabetes self-management. Patient education plan is to attend individual and/or group sessions per assessed needs and concerns.   Plan:   Patient Instructions  High-Fiber Eating Plan Fiber, also called dietary fiber, is a type of carbohydrate. It is found foods such as fruits, vegetables, whole grains, and beans. A high-fiber diet can have many health benefits. Your health care provider may recommend a high-fiber diet to help: Prevent constipation. Fiber can make your bowel movements more regular. Lower your cholesterol. Relieve the following conditions: Inflammation of veins in the anus (hemorrhoids). Inflammation of specific areas of the digestive tract (uncomplicated diverticulosis). A problem of the large intestine, also called the colon, that sometimes causes pain and diarrhea (irritable bowel syndrome, or IBS). Prevent overeating as part of a weight-loss plan. Prevent heart disease, type 2 diabetes, and certain cancers. What are tips for following this plan? Reading food labels  Check the nutrition facts label on food products for the amount of dietary fiber. Choose foods that have 5 grams of fiber or more per serving. The goals for recommended daily fiber intake include: Men (age 55 or younger): 34-38 g. Men (over age 43):  28-34 g. Women (age 13 or younger): 25-28 g. Women (over age 24): 22-25 g. Your daily fiber goal is _____________ g. Shopping Choose whole fruits and vegetables instead of processed forms, such as apple juice or applesauce. Choose a wide variety of high-fiber foods such as avocados, lentils, oats, and kidney beans. Read the nutrition facts label of the foods you choose. Be aware of foods with added fiber. These foods often have high sugar and sodium amounts per serving. Cooking Use whole-grain flour for baking and cooking. Cook with brown rice instead of white rice. Meal planning Start the day with a breakfast that is high in fiber, such as a cereal that contains 5 g of fiber or more per serving. Eat breads and cereals that are made with whole-grain flour instead of refined flour or white flour. Eat brown rice, bulgur wheat, or millet instead of white rice. Use beans in place of meat in soups, salads, and pasta dishes. Be sure that half of the grains you eat each  day are whole grains. General information You can get the recommended daily intake of dietary fiber by: Eating a variety of fruits, vegetables, grains, nuts, and beans. Taking a fiber supplement if you are not able to take in enough fiber in your diet. It is better to get fiber through food than from a supplement. Gradually increase how much fiber you consume. If you increase your intake of dietary fiber too quickly, you may have bloating, cramping, or gas. Drink plenty of water to help you digest fiber. Choose high-fiber snacks, such as berries, raw vegetables, nuts, and popcorn. What foods should I eat? Fruits Berries. Pears. Apples. Oranges. Avocado. Prunes and raisins. Dried figs. Vegetables Sweet potatoes. Spinach. Kale. Artichokes. Cabbage. Broccoli. Cauliflower. Green peas. Carrots. Squash. Grains Whole-grain breads. Multigrain cereal. Oats and oatmeal. Brown rice. Barley. Bulgur wheat. [place]. Quinoa. Bran muffins.  Popcorn. Rye wafer crackers. Meats and other proteins Navy beans, kidney beans, and pinto beans. Soybeans. Split peas. Lentils. Nuts and seeds. Dairy Fiber-fortified yogurt. Beverages Fiber-fortified soy milk. Fiber-fortified orange juice. Other foods Fiber bars. The items listed above may not be a complete list of recommended foods and beverages. Contact a dietitian for more information. What foods should I avoid? Fruits Fruit juice. Cooked, strained fruit. Vegetables Fried potatoes. Canned vegetables. Well-cooked vegetables. Grains White bread. Pasta made with refined flour. White rice. Meats and other proteins Fatty cuts of meat. Fried chicken or fried fish. Dairy Milk. Yogurt. Cream cheese. Sour cream. Fats and oils Butters. Beverages Soft drinks. Other foods Cakes and pastries. The items listed above may not be a complete list of foods and beverages to avoid. Talk with your dietitian about what choices are best for you. Summary Fiber is a type of carbohydrate. It is found in foods such as fruits, vegetables, whole grains, and beans. A high-fiber diet has many benefits. It can help to prevent constipation, lower blood cholesterol, aid weight loss, and reduce your risk of heart disease, diabetes, and certain cancers. Increase your intake of fiber gradually. Increasing fiber too quickly may cause cramping, bloating, and gas. Drink plenty of water while you increase the amount of fiber you consume. The best sources of fiber include whole fruits and vegetables, whole grains, nuts, seeds, and beans.    We agreed to follow up on the same day you see Dr. Dareen Piano next visit or in 4-6 weeks whichever you prefer. What we talked about today-  1- What is diabetes 2- Foods I should eat to keep my blood sugar under control- limit portions of starchy foods, more protein and veggies 3- Checking your blood sugar- please bring your meter to all visits- I suggest checking 1x/week ior when  you do not feel right. Dr. Dareen Piano may change this recommendation if he wants more information.  4- Referring you to the exercise program you should get a call from them Butch Penny [phone]  3-    Expected Outcomes:  Demonstrated interest in learning. Expect positive outcomes  Education material provided: Diabetes Resources  If problems or questions, patient to contact team via:  Phone  Future DSME appointment: 4-6 wks Debera Lat, RD 07/21/2021 4:47 PM.

## 2021-07-22 ENCOUNTER — Other Ambulatory Visit: Payer: Self-pay | Admitting: Internal Medicine

## 2021-07-22 DIAGNOSIS — E1169 Type 2 diabetes mellitus with other specified complication: Secondary | ICD-10-CM

## 2021-07-22 DIAGNOSIS — Z6836 Body mass index (BMI) 36.0-36.9, adult: Secondary | ICD-10-CM

## 2021-07-22 DIAGNOSIS — E669 Obesity, unspecified: Secondary | ICD-10-CM

## 2021-07-22 NOTE — Assessment & Plan Note (Addendum)
-   Patient had a phone visit on November 16 and complained of increased weakness and dizziness since starting Jardiance.  This was discontinued and patient was titrated up on metformin to 1 tablet in the morning and 2 tablets at night -Patient states that she discontinued the Jardiance after her telephone visit here and has been taking metformin.  She states that she did have some GI side effects of metformin but these have since improved.  However, she does complain of increased gas with the metformin -On further discussion with the patient she states that she takes 2 pills of metformin at 2 AM and no other metformin rest of the day -I explained to the patient that she needs to take 2 pills of metformin in the morning with breakfast and 2 pills of metformin at night with dinner and that this would help with her GI side effects and would be the appropriate dose for her. -Patient expressed understanding and is in agreement with plan. -We will continue to hold Jardiance at this time -No further work-up for now -We will need repeat A1c in February.  Patient may benefit from addition of GLP-1 agonist given her history of obesity.  Will discuss with patient at follow-up visit

## 2021-07-22 NOTE — Assessment & Plan Note (Signed)
-  This problem is chronic and stable -Patient noted low fibrosis score and did not need further work-up for NASH -She met with Butch Penny today for weight loss strategies and a diet plan -She was interested in the provider referral exercise program and I have put in a referral for this for her -We will follow-up with this at her next visit

## 2021-07-22 NOTE — Progress Notes (Signed)
   Subjective:    Patient ID: Madison Jefferson, female    DOB: 08-13-1951, 70 y.o.   MRN: [id number]  HPI  I have seen and examined this patient.  Patient is here for routine follow-up of her hypertension and diabetes.  Patient denies any new complaints today and states that she feels well.  She is compliant with all her medications.   Review of Systems  Constitutional: Negative.   HENT: Negative.    Respiratory: Negative.    Cardiovascular: Negative.   Gastrointestinal: Negative.   Musculoskeletal: Negative.   Neurological: Negative.   Psychiatric/Behavioral: Negative.        Objective:   Physical Exam Constitutional:      Appearance: Normal appearance.  HENT:     Head: Normocephalic and atraumatic.  Cardiovascular:     Rate and Rhythm: Normal rate and regular rhythm.     Heart sounds: Normal heart sounds.  Pulmonary:     Effort: No respiratory distress.     Breath sounds: Normal breath sounds. No wheezing.  Abdominal:     General: Bowel sounds are normal. There is no distension.     Palpations: Abdomen is soft.     Tenderness: There is no abdominal tenderness.  Musculoskeletal:        General: No swelling or tenderness.     Cervical back: Neck supple.  Lymphadenopathy:     Cervical: No cervical adenopathy.  Neurological:     Mental Status: She is alert and oriented to person, place, and time.  Psychiatric:        Mood and Affect: Mood normal.        Behavior: Behavior normal.          Assessment & Plan:  Please see problem list charting for assessment and plan:

## 2021-07-22 NOTE — Assessment & Plan Note (Signed)
-   Patient complained of dysphagia on her last visit 1 month ago and stated that she had a feeling that something was stuck in her throat after eating -She underwent a modified barium swallow study which showed no evidence of dysphagia -Patient states that he currently has no episodes of dysphagia either -No further work-up at this time -We will continue to monitor

## 2021-07-22 NOTE — Assessment & Plan Note (Signed)
BP Readings from Last 3 Encounters:  07/21/21 (!) 157/69  06/23/21 (!) 147/61  04/15/21 (!) 130/58    Lab Results  Component Value Date   NA 137 06/23/2021   K 4.2 06/23/2021   CREATININE 0.87 06/23/2021    Assessment: Blood pressure control:  Fair Progress toward BP goal:   Unchanged Comments: Patient states that she is compliant with olmesartan/amlodipine/HCTZ 40/10/25 mg daily as well as metoprolol 100 mg daily  Plan: Medications:  continue current medications Educational resources provided:   Self management tools provided:   Other plans: Patient blood pressure was noted to be elevated today with systolics in the 500B initially and decreasing to the 150s after rest.  Patient is already on 4 blood pressure medications that are maximally dosed.  She did have a renin/aldosterone level done last year which was within normal limits.  We will consider adding Aldactone to her regimen at her next visit if her blood pressure remains elevated and considering a more detailed work-up for secondary hypertension.

## 2021-07-22 NOTE — Assessment & Plan Note (Signed)
-  Patient's weight is down approximately 9 pounds since the beginning of this year -She met with Butch Penny to work on diet plan for weight loss and was also referred to the provider referral exercise program to get on a structured exercise regimen -Possibly patient would benefit from a GLP-1 agonist given her diabetes and obesity.  We will discuss this with her at her next visit.

## 2021-07-22 NOTE — Assessment & Plan Note (Signed)
-   Patient states that she is up-to-date with her flu and COVID vaccines -No further work-up at this time

## 2021-07-24 ENCOUNTER — Telehealth: Payer: Self-pay

## 2021-07-24 NOTE — Telephone Encounter (Signed)
Call to pt reference referral to [facility] [place] program to pt Interested in participating. Lives closest to [place] but can come to [place] for 08/17/21 class M/W 1p-215p Intake scheduled for 08/12/21 at 11am at [place]

## 2021-07-26 DIAGNOSIS — H2511 Age-related nuclear cataract, right eye: Secondary | ICD-10-CM | POA: Diagnosis not present

## 2021-07-30 DIAGNOSIS — H268 Other specified cataract: Secondary | ICD-10-CM | POA: Diagnosis not present

## 2021-07-30 DIAGNOSIS — H25011 Cortical age-related cataract, right eye: Secondary | ICD-10-CM | POA: Diagnosis not present

## 2021-08-03 ENCOUNTER — Telehealth: Payer: Self-pay

## 2021-08-12 ENCOUNTER — Telehealth: Payer: Self-pay

## 2021-08-12 NOTE — Telephone Encounter (Signed)
Call placed to pt reference appt for intake scheduled for today at [place] patient who forgot about appt (text reminder sent yesterday) Rescheduled for 12/29 at 415pm. Wil f/u with her tomorrow to determine if 08/17/21 will work for her.

## 2021-08-14 ENCOUNTER — Telehealth: Payer: Self-pay

## 2021-08-14 NOTE — Telephone Encounter (Signed)
Talked with pt yesterday afternoon. She had gone to the [facility] instead of the [facility] for intake.  Attempted to help pt get to [facility] however she did not show.  Called this am to clarify if pt wanted to participate in PREP at [facility] asked that I cancel it.  Asked that she call me for future classes if she wants to attend.

## 2021-08-24 ENCOUNTER — Telehealth: Payer: Self-pay

## 2021-08-24 NOTE — Telephone Encounter (Signed)
#  120 with 1 refill sent 07/21/21. Call placed to patient. States PCP increased her dose to 2 tabs (1000 mg total) BID. Patient should still have 1 refill remaining. She will contact pharmacy and call back if there are any issues.

## 2021-08-24 NOTE — Telephone Encounter (Signed)
metFORMIN (GLUCOPHAGE-XR) 500 MG 24 hr tablet, refill request @ [place], [address] AT [place].

## 2021-08-25 ENCOUNTER — Telehealth: Payer: Self-pay

## 2021-08-25 NOTE — Telephone Encounter (Signed)
Return call to Jon at [facility] - on hold x 10 mins w/o speaking to someone. Will wait for pharmacy to call back.

## 2021-08-25 NOTE — Telephone Encounter (Signed)
Jon with [facility] requesting to speak with a nurse about metFORMIN (GLUCOPHAGE-XR) 500 MG 24 hr tablet. Please call pt back.

## 2021-08-26 ENCOUNTER — Other Ambulatory Visit: Payer: Self-pay

## 2021-08-26 ENCOUNTER — Other Ambulatory Visit: Payer: Self-pay | Admitting: Internal Medicine

## 2021-08-26 ENCOUNTER — Telehealth: Payer: Self-pay | Admitting: *Deleted

## 2021-08-26 DIAGNOSIS — E1169 Type 2 diabetes mellitus with other specified complication: Secondary | ICD-10-CM

## 2021-08-26 MED ORDER — METFORMIN HCL ER 500 MG PO TB24: 1000.0000 mg | ORAL_TABLET | Freq: Two times a day (BID) | ORAL | 1 refills | Status: DC

## 2021-08-26 NOTE — Telephone Encounter (Signed)
Call from Jenny Reichmann at [facility] for instructions for Metformin.  Patient said she was 2 tabs of the 500 mg Metformin tablets twice daily.  Instructions for where given to Pharmacist from doctors last visit with patient in 07/2021.

## 2021-08-26 NOTE — Telephone Encounter (Signed)
Called to Pharmacy this am.

## 2021-09-18 ENCOUNTER — Telehealth: Payer: Self-pay

## 2021-09-18 NOTE — Telephone Encounter (Signed)
Call placed to pt reference next PREP class starting at [facility] Had difficulty locating [facility] last time.  Wants to try to find the location first before committing to starting.  Will call back and let me know if she wants to be in the 2/20 class

## 2021-09-22 ENCOUNTER — Other Ambulatory Visit: Payer: Self-pay | Admitting: Internal Medicine

## 2021-09-22 DIAGNOSIS — J301 Allergic rhinitis due to pollen: Secondary | ICD-10-CM

## 2021-09-25 ENCOUNTER — Telehealth: Payer: Self-pay

## 2021-09-25 NOTE — Telephone Encounter (Signed)
VMT pt reference PREP class starting on 10/05/21 Request call back if she would like to participate.

## 2021-09-29 ENCOUNTER — Other Ambulatory Visit: Payer: Self-pay | Admitting: Internal Medicine

## 2021-09-29 DIAGNOSIS — E1169 Type 2 diabetes mellitus with other specified complication: Secondary | ICD-10-CM

## 2021-10-02 ENCOUNTER — Other Ambulatory Visit: Payer: Self-pay | Admitting: Internal Medicine

## 2021-10-02 DIAGNOSIS — H4922 Sixth [abducent] nerve palsy, left eye: Secondary | ICD-10-CM

## 2021-10-13 ENCOUNTER — Encounter: Payer: Medicare Other | Admitting: Internal Medicine

## 2021-11-04 ENCOUNTER — Encounter: Payer: Self-pay | Admitting: Internal Medicine

## 2021-11-04 ENCOUNTER — Ambulatory Visit (INDEPENDENT_AMBULATORY_CARE_PROVIDER_SITE_OTHER): Payer: Medicare Other | Admitting: Internal Medicine

## 2021-11-04 VITALS — BP 141/56 | HR 73 | Temp 98.3°F | Ht 61.0 in | Wt 176.2 lb

## 2021-11-04 DIAGNOSIS — K760 Fatty (change of) liver, not elsewhere classified: Secondary | ICD-10-CM | POA: Diagnosis not present

## 2021-11-04 DIAGNOSIS — E559 Vitamin D deficiency, unspecified: Secondary | ICD-10-CM | POA: Diagnosis not present

## 2021-11-04 DIAGNOSIS — Z0000 Encounter for general adult medical examination without abnormal findings: Secondary | ICD-10-CM

## 2021-11-04 DIAGNOSIS — J45909 Unspecified asthma, uncomplicated: Secondary | ICD-10-CM | POA: Diagnosis not present

## 2021-11-04 DIAGNOSIS — I10 Essential (primary) hypertension: Secondary | ICD-10-CM

## 2021-11-04 DIAGNOSIS — E1169 Type 2 diabetes mellitus with other specified complication: Secondary | ICD-10-CM

## 2021-11-04 DIAGNOSIS — E039 Hypothyroidism, unspecified: Secondary | ICD-10-CM | POA: Diagnosis not present

## 2021-11-04 LAB — POCT GLYCOSYLATED HEMOGLOBIN (HGB A1C): Hemoglobin A1C: 6 % — AB (ref 4.0–5.6)

## 2021-11-04 LAB — GLUCOSE, CAPILLARY: Glucose-Capillary: 123 mg/dL — ABNORMAL HIGH (ref 70–99)

## 2021-11-04 MED ORDER — VITAMIN D 25 MCG (1000 UNIT) PO TABS: 1000.0000 [IU] | ORAL_TABLET | Freq: Every day | ORAL | 1 refills | Status: DC

## 2021-11-04 MED ORDER — ALBUTEROL SULFATE HFA 108 (90 BASE) MCG/ACT IN AERS: INHALATION_SPRAY | RESPIRATORY_TRACT | 1 refills | Status: DC

## 2021-11-04 MED ORDER — METFORMIN HCL ER 500 MG PO TB24: ORAL_TABLET | ORAL | 3 refills | Status: DC

## 2021-11-04 NOTE — Patient Instructions (Signed)
-  It was a pleasure seeing you today ?-We will decrease your metformin dose to 1 pill in the morning and 2 pills at night with food ?-Your diabetes is well controlled.  Keep up the great work! ?-Continue with your diet and exercise.  Your weight is down approximately 14 pounds since your last visit.  Keep up the great work! ?-Your blood pressure has improved.  We will continue continue with your current medications for now ?-I have put in refills for your vitamin D and your albuterol to your pharmacy ?-We will check some blood work at your next visit ?-Please obtain your COVID booster and your shingles vaccine at your pharmacy ?-Please call with any questions or concerns or if you need any refills ?

## 2021-11-04 NOTE — Assessment & Plan Note (Signed)
BP Readings from Last 3 Encounters:  ?11/04/21 (!) 141/56  ?07/21/21 (!) 157/69  ?06/23/21 (!) 147/61  ? ? ?Lab Results  ?Component Value Date  ? NA 137 06/23/2021  ? K 4.2 06/23/2021  ? CREATININE 0.87 06/23/2021  ? ? ?Assessment: ?Blood pressure control:  Fair ?Progress toward BP goal:   Improved ?Comments: Patient is compliant with olmesartan/amlodipine/HCTZ 40 mg/10 mg / 25 mg and metoprolol succinate 100 mg daily ? ?Plan: ?Medications:  continue current medications ?Educational resources provided:   ?Actor tools provided:   ?Other plans: We will check BMP at follow-up visit ?

## 2021-11-04 NOTE — Assessment & Plan Note (Signed)
-   This problem is chronic and stable ?-We will continue with vitamin D supplementation of 1000 units daily ?-Refill was called into her pharmacy today ?-We will recheck her vitamin D level at her next visit ?-No further work-up at this time ?

## 2021-11-04 NOTE — Assessment & Plan Note (Signed)
-   This problem is chronic and stable ?-Patient was noted to have low fibrosis score and is on a further work-up for NASH ?-We discussed the importance of diet and exercise and weight loss ?-Patient has lost approximately 14 pounds since her last visit and was congratulated on this ?-Patient was encouraged to continue with her diet and exercise regimen ?-No further work-up at this time ?

## 2021-11-04 NOTE — Assessment & Plan Note (Signed)
-   Patient states that she got her flu shot at her pharmacy ?-She still has to get her COVID booster ?-We discussed getting her shingles vaccine at her pharmacy as well and the importance of this vaccine ?-We will follow-up with her at her next visit ?

## 2021-11-04 NOTE — Progress Notes (Signed)
? ?  Subjective:  ? ? Patient ID: Madison Jefferson, female    DOB: [birth year], 71 y.o.   MRN: [id number] ? ?Diabetes ? ? ?I have seen and examined this patient.  Patient is here for routine follow-up of her hypertension and diabetes. ? ?Patient states that she feels well today and denies any new complaints.  She states that she has been following a diet and exercise regimen at home.  She denies any other complaints at this time and states that she is compliant with her medications ? ? ?Review of Systems  ?Constitutional: Negative.   ?HENT: Negative.    ?Respiratory: Negative.    ?Cardiovascular: Negative.   ?Gastrointestinal: Negative.   ?Musculoskeletal: Negative.   ?Neurological: Negative.   ?Psychiatric/Behavioral: Negative.    ? ?   ?Objective:  ? Physical Exam ?Constitutional:   ?   Appearance: Normal appearance.  ?HENT:  ?   Head: Normocephalic and atraumatic.  ?Cardiovascular:  ?   Rate and Rhythm: Regular rhythm.  ?   Heart sounds: Normal heart sounds. No murmur heard. ?Pulmonary:  ?   Effort: No respiratory distress.  ?   Breath sounds: Normal breath sounds. No wheezing.  ?Abdominal:  ?   General: Bowel sounds are normal. There is no distension.  ?   Palpations: Abdomen is soft.  ?   Tenderness: There is no abdominal tenderness.  ?Musculoskeletal:     ?   General: No swelling or tenderness.  ?Neurological:  ?   Mental Status: She is alert and oriented to person, place, and time.  ?Psychiatric:     ?   Mood and Affect: Mood normal.     ?   Behavior: Behavior normal.  ? ? ? ? ? ?   ?Assessment & Plan:  ?Please see problem based charting for assessment and plan: ? ?

## 2021-11-04 NOTE — Assessment & Plan Note (Signed)
-   This problem is chronic and stable ?-Patient states that she needs to use her inhaler very infrequently but more so when the weather gets warmer ?-We will refill patient's albuterol inhaler today ?-No further work-up at this time ?

## 2021-11-04 NOTE — Assessment & Plan Note (Addendum)
Lab Results  ?Component Value Date  ? HGBA1C 6.0 (A) 11/04/2021  ? HGBA1C 10.3 (A) 06/23/2021  ? HGBA1C 8.4 (A) 11/14/2020  ?  ? ?Assessment: ?Diabetes control:  Well-controlled ?Progress toward A1C goal:   At goal ?Comments: Patient is compliant with metformin 1000 mg twice daily.  Patient does state that she has intermittent abdominal pain and diarrhea with her a.m. dose ? ?Plan: ?Medications:  Will decrease metformin to 500 mg in the a.m. and 1000 mg at night. ?Home glucose monitoring: ?Frequency:   ?Timing:   ?Instruction/counseling given: discussed the need for weight loss ?Educational resources provided:   ?Actor tools provided:   ?Other plans: We will check BMP at next visit.  Patient states that she got her eye exam with Dr. Katy Fitch recently ? ? ?

## 2021-11-04 NOTE — Assessment & Plan Note (Signed)
-   This problem is chronic and stable ?-Patient has no symptoms of hypo-/hyperthyroidism at this time ?-We will continue with Synthroid 100 mcg daily ?-We will recheck TSH at next visit ? ?

## 2021-11-06 ENCOUNTER — Ambulatory Visit (INDEPENDENT_AMBULATORY_CARE_PROVIDER_SITE_OTHER): Payer: Medicare Other | Admitting: Student in an Organized Health Care Education/Training Program

## 2021-11-06 ENCOUNTER — Encounter: Payer: Self-pay | Admitting: Student

## 2021-11-06 DIAGNOSIS — Z0000 Encounter for general adult medical examination without abnormal findings: Secondary | ICD-10-CM

## 2021-11-06 NOTE — Progress Notes (Signed)
? ?Subjective:  ? Madison Jefferson is a 71 y.o. female who presents for an Initial Medicare Annual Wellness Visit. ?I connected with  Madison Jefferson on 11/06/21 by a audio enabled telemedicine application and verified that I am speaking with the correct person using two identifiers. ? ?Patient Location: Home ? ?Provider Location: Office/Clinic ? ?I discussed the limitations of evaluation and management by telemedicine. The patient expressed understanding and agreed to proceed.  ?Review of Systems    ?Defer to PCP ?  ? ?   ?Objective:  ?  ?Today's Vitals  ? 11/06/21 1358  ?PainSc: 0-No pain  ? ?There is no height or weight on file to calculate BMI. ? ? ?  11/04/2021  ?  9:47 AM 06/23/2021  ? 11:06 AM 11/14/2020  ?  9:37 AM 10/14/2020  ? 11:10 AM 08/27/2020  ?  2:31 PM 07/15/2020  ? 10:42 AM 04/14/2020  ?  3:44 PM  ?Advanced Directives  ?Does Patient Have a Medical Advance Directive? _0  No No  ?Would patient like information on creating a medical advance directive? No - Patient declined No - Patient declined Yes (ED - Information included in AVS) No - Patient declined No - Patient declined No - Patient declined   ? ? ?Current Medications (verified) ?Outpatient Encounter Medications as of 11/06/2021  ?Medication Sig  ? Accu-Chek Softclix Lancets lancets Check blood sugar up to 1 time a day  ? albuterol (VENTOLIN HFA) 108 (90 Base) MCG/ACT inhaler INHALE 1 TO 2 PUFFS INTO THE LUNGS EVERY 6 HOURS AS NEEDED FOR WHEEZING OR SHORTNESS OF BREATH  ? aspirin (ASPIRIN LOW DOSE) 81 MG EC tablet TAKE 1 TABLET(81 MG) BY MOUTH DAILY. SWALLOW WHOLE  ? Blood Glucose Monitoring Suppl (ACCU-CHEK GUIDE) w/Device KIT Check blood sugar up to 1 time a day  ? cholecalciferol (VITAMIN D3) 25 MCG (1000 UNIT) tablet Take 1 tablet (1,000 Units total) by mouth daily.  ? glucose blood (ACCU-CHEK GUIDE) test strip Check blood sugar up to 1 time per day  ? levothyroxine (SYNTHROID) 100 MCG tablet TAKE 1 TABLET(100 MCG) BY MOUTH DAILY  ? loratadine  (CLARITIN) 10 MG tablet TAKE 1 TABLET(10 MG) BY MOUTH DAILY  ? metFORMIN (GLUCOPHAGE-XR) 500 MG 24 hr tablet Take 1 tablet in the morning with breakfast and 2 tablets at night with dinner  ? metoprolol succinate (TOPROL-XL) 100 MG 24 hr tablet TAKE 1 TABLET BY MOUTH EVERY DAY WITH OR IMMEDIATELY FOLLOWING A MEAL  ? mometasone (NASONEX) 50 MCG/ACT nasal spray Place 2 sprays into the nose daily.  ? Olmesartan-amLODIPine-HCTZ 40-10-25 MG TABS TAKE 1 TABLET BY MOUTH DAILY  ? ?No facility-administered encounter medications on file as of 11/06/2021.  ? ? ?Allergies (verified) ?Oysters [shellfish allergy]  ? ?History: ?Past Medical History:  ?Diagnosis Date  ? Allergy   ? Arthritis   ? Asthma   ? Atrial fibrillation ([place])   ? h/o of isolated AF associated with hypothyroidism   ? Diverticulosis   ? cecum  ? Diverticulosis   ? colonoscopy 2002 [facility]  ? Dysphagia 06/23/2021  ? Fatty liver   ? GERD (gastroesophageal reflux disease)   ? Hypertension   ? Lumbar radiculopathy   ? Thyroid disease   ? hypothyroid  ? TIA (transient ischemic attack)   ? 2006  ? Urinary incontinence   ? mixed urge and stress   ? Vitamin D deficiency   ? ?Past Surgical History:  ?Procedure Laterality Date  ?  ABLATION    ? thyroid with radioactive iodine ablation  ? CARPAL TUNNEL RELEASE    ? left wrist  ? CHOLECYSTECTOMY    ? LEFT HEART CATHETERIZATION WITH CORONARY ANGIOGRAM N/A 01/28/2012  ? Procedure: LEFT HEART CATHETERIZATION WITH CORONARY ANGIOGRAM;  Surgeon: Ajay S Kadakia, MD;  Location: [facility] CATH LAB;  Service: Cardiovascular;  Laterality: N/A;  ? OTHER SURGICAL HISTORY    ? carpel tunnel surgery   ? OTHER SURGICAL HISTORY    ? carpel tunnel repair  ? RIGHT HEART CATH  2005-Dr. Kadakia  ? minimal CAD; left circumflex dominant with 10-15% proximal lesion;; normal LVF  ? TUBAL LIGATION    ? VAGINAL HYSTERECTOMY    ? partial hysterectomy 1983 vaginal bleeding  ? ?Family History  ?Problem Relation Age of Onset  ? Diabetes Mother   ? Stroke Mother    ? Cancer Mother   ?     mother <50 at diagnosis (breast); h/o oral cancer  ? Diabetes Father   ? Heart disease Father   ? Stroke Father   ? Hypertension Father   ? Diabetes Brother   ? Colon cancer Brother   ? Diabetes Sister   ? Diabetes Paternal Uncle   ? Cancer Brother   ?     lung  ? Cancer Brother   ?     throat  ? Esophageal cancer Neg Hx   ? Rectal cancer Neg Hx   ? Stomach cancer Neg Hx   ? ?Social History  ? ?Socioeconomic History  ? Marital status: Divorced  ?  Spouse name: Not on file  ? Number of children: Not on file  ? Years of education: Not on file  ? Highest education level: Not on file  ?Occupational History  ? Not on file  ?Tobacco Use  ? Smoking status: Never  ? Smokeless tobacco: Never  ?Substance and Sexual Activity  ? Alcohol use: No  ?  Alcohol/week: 0.0 standard drinks  ? Drug use: No  ? Sexual activity: Yes  ?Other Topics Concern  ? Not on file  ?Social History Narrative  ? Right handed  ? Lives alone in a two story  ? ?Social Determinants of Health  ? ?Financial Resource Strain: Low Risk   ? Difficulty of Paying Living Expenses: Not hard at all  ?Food Insecurity: No Food Insecurity  ? Worried About Running Out of Food in the Last Year: Never true  ? Ran Out of Food in the Last Year: Never true  ?Transportation Needs: No Transportation Needs  ? Lack of Transportation (Medical): No  ? Lack of Transportation (Non-Medical): No  ?Physical Activity: Inactive  ? Days of Exercise per Week: 0 days  ? Minutes of Exercise per Session: 0 min  ?Stress: No Stress Concern Present  ? Feeling of Stress : Not at all  ?Social Connections: Moderately Integrated  ? Frequency of Communication with Friends and Family: More than three times a week  ? Frequency of Social Gatherings with Friends and Family: Once a week  ? Attends Religious Services: More than 4 times per year  ? Active Member of Clubs or Organizations: Yes  ? Attends Club or Organization Meetings: More than 4 times per year  ? Marital Status:  Divorced  ? ? ?Tobacco Counseling ?Counseling given: Not Answered ? ? ?Clinical Intake: ? ?Pre-visit preparation completed: Yes ? ?Pain : No/denies pain ?Pain Score: 0-No pain ? ?  ? ?Nutritional Risks: None ?Diabetes: Yes ? ?How often do you   need to have someone help you when you read instructions, pamphlets, or other written materials from your doctor or pharmacy?: 1 - Never ?What is the last grade level you completed in school?: college ? ?Diabetic?Yes ? ?Interpreter Needed?: No ? ?Information entered by :: Midge Minium 11/06/2021 1:59pm ? ? ?Activities of Daily Living ? ?  11/06/2021  ?  2:09 PM 11/04/2021  ?  9:48 AM  ?In your present state of health, do you have any difficulty performing the following activities:  ?Hearing? 0 0  ?Vision? 0 0  ?Difficulty concentrating or making decisions? 0 0  ?Walking or climbing stairs? 0 0  ?Dressing or bathing? 0 0  ?Doing errands, shopping? 0 0  ? ? ?Patient Care Team: ?Aldine Contes, MD as PCP - General (Internal Medicine) ?Aldine Contes, MD (Internal Medicine) ? ?Indicate any recent Medical Services you may have received from other than [facility] providers in the past year (date may be approximate). ? ?   ?Assessment:  ? This is a routine wellness examination for [facility]. ? ?Hearing/Vision screen ?No results found. ? ?Dietary issues and exercise activities discussed: ?  ? ? Goals Addressed   ?None ?  ?Depression Screen ? ?  11/06/2021  ?  2:07 PM 11/06/2021  ?  2:02 PM 11/04/2021  ? 10:00 AM 11/04/2021  ?  9:49 AM 07/21/2021  ?  4:02 PM 06/23/2021  ? 11:05 AM 11/14/2020  ?  9:36 AM  ?PHQ 2/9 Scores  ?PHQ - 2 Score 0 0 0 0 0 0 0  ?PHQ- 9 Score 1    0  0  ?  ?Fall Risk ? ?  11/06/2021  ?  2:07 PM 11/04/2021  ?  9:48 AM 07/21/2021  ?  4:01 PM 07/21/2021  ?  3:58 PM 06/23/2021  ? 11:04 AM  ?Fall Risk   ?Falls in the past year? 0 0  0 0  ?Number falls in past yr: 0 0  0 0  ?Injury with Fall? 0 0  0 0  ?Risk for fall due to : No Fall Risks      ?Follow up Falls evaluation  completed;Falls prevention discussed Falls evaluation completed Falls evaluation completed  Falls evaluation completed  ? ? ?FALL RISK PREVENTION PERTAINING TO THE HOME: ? ?Any stairs in or around the home? Yes  ?If so,

## 2021-11-09 NOTE — Progress Notes (Signed)
I discussed the AWV findings with the provider who conducted the visit. I was present in the office suite and immediately available to provide assistance and direction throughout the time the service was provided.  ?

## 2021-11-12 ENCOUNTER — Ambulatory Visit: Payer: Medicare Other | Admitting: Internal Medicine

## 2021-11-26 DIAGNOSIS — H5711 Ocular pain, right eye: Secondary | ICD-10-CM | POA: Diagnosis not present

## 2021-12-29 ENCOUNTER — Other Ambulatory Visit: Payer: Self-pay | Admitting: Internal Medicine

## 2021-12-29 DIAGNOSIS — Z1231 Encounter for screening mammogram for malignant neoplasm of breast: Secondary | ICD-10-CM

## 2022-01-06 ENCOUNTER — Other Ambulatory Visit: Payer: Self-pay | Admitting: Internal Medicine

## 2022-01-06 DIAGNOSIS — E039 Hypothyroidism, unspecified: Secondary | ICD-10-CM

## 2022-01-06 DIAGNOSIS — I10 Essential (primary) hypertension: Secondary | ICD-10-CM

## 2022-01-07 ENCOUNTER — Other Ambulatory Visit: Payer: Self-pay | Admitting: Internal Medicine

## 2022-01-07 DIAGNOSIS — I10 Essential (primary) hypertension: Secondary | ICD-10-CM

## 2022-01-25 ENCOUNTER — Other Ambulatory Visit: Payer: Self-pay

## 2022-01-25 ENCOUNTER — Emergency Department (HOSPITAL_COMMUNITY): Payer: Medicare Other

## 2022-01-25 ENCOUNTER — Ambulatory Visit (HOSPITAL_COMMUNITY)
Admission: EM | Admit: 2022-01-25 | Discharge: 2022-01-25 | Disposition: A | Discharge status: Home / Self Care | Payer: Medicare Other

## 2022-01-25 ENCOUNTER — Inpatient Hospital Stay (HOSPITAL_COMMUNITY)
Admission: EM | Admit: 2022-01-25 | Discharge: 2022-01-30 | DRG: 394 | Disposition: A | Discharge status: Home / Self Care | Payer: Medicare Other | Source: Ambulatory Visit | Attending: Internal Medicine | Admitting: Internal Medicine

## 2022-01-25 ENCOUNTER — Encounter (HOSPITAL_COMMUNITY): Payer: Self-pay

## 2022-01-25 ENCOUNTER — Telehealth: Payer: Self-pay | Admitting: *Deleted

## 2022-01-25 DIAGNOSIS — M1990 Unspecified osteoarthritis, unspecified site: Secondary | ICD-10-CM | POA: Diagnosis present

## 2022-01-25 DIAGNOSIS — K529 Noninfective gastroenteritis and colitis, unspecified: Secondary | ICD-10-CM | POA: Diagnosis not present

## 2022-01-25 DIAGNOSIS — I4891 Unspecified atrial fibrillation: Secondary | ICD-10-CM | POA: Diagnosis present

## 2022-01-25 DIAGNOSIS — D649 Anemia, unspecified: Secondary | ICD-10-CM | POA: Diagnosis not present

## 2022-01-25 DIAGNOSIS — K449 Diaphragmatic hernia without obstruction or gangrene: Secondary | ICD-10-CM | POA: Diagnosis present

## 2022-01-25 DIAGNOSIS — Z833 Family history of diabetes mellitus: Secondary | ICD-10-CM

## 2022-01-25 DIAGNOSIS — E039 Hypothyroidism, unspecified: Secondary | ICD-10-CM | POA: Diagnosis present

## 2022-01-25 DIAGNOSIS — K635 Polyp of colon: Secondary | ICD-10-CM | POA: Diagnosis not present

## 2022-01-25 DIAGNOSIS — E876 Hypokalemia: Secondary | ICD-10-CM | POA: Diagnosis not present

## 2022-01-25 DIAGNOSIS — Z7982 Long term (current) use of aspirin: Secondary | ICD-10-CM | POA: Diagnosis not present

## 2022-01-25 DIAGNOSIS — R079 Chest pain, unspecified: Secondary | ICD-10-CM

## 2022-01-25 DIAGNOSIS — Z79899 Other long term (current) drug therapy: Secondary | ICD-10-CM | POA: Diagnosis not present

## 2022-01-25 DIAGNOSIS — Z7984 Long term (current) use of oral hypoglycemic drugs: Secondary | ICD-10-CM | POA: Diagnosis not present

## 2022-01-25 DIAGNOSIS — R0602 Shortness of breath: Secondary | ICD-10-CM | POA: Diagnosis not present

## 2022-01-25 DIAGNOSIS — Z79890 Hormone replacement therapy (postmenopausal): Secondary | ICD-10-CM

## 2022-01-25 DIAGNOSIS — Z91013 Allergy to seafood: Secondary | ICD-10-CM

## 2022-01-25 DIAGNOSIS — Z8249 Family history of ischemic heart disease and other diseases of the circulatory system: Secondary | ICD-10-CM

## 2022-01-25 DIAGNOSIS — Z9851 Tubal ligation status: Secondary | ICD-10-CM

## 2022-01-25 DIAGNOSIS — K5730 Diverticulosis of large intestine without perforation or abscess without bleeding: Secondary | ICD-10-CM | POA: Diagnosis not present

## 2022-01-25 DIAGNOSIS — Z90711 Acquired absence of uterus with remaining cervical stump: Secondary | ICD-10-CM | POA: Diagnosis not present

## 2022-01-25 DIAGNOSIS — K3189 Other diseases of stomach and duodenum: Secondary | ICD-10-CM | POA: Diagnosis not present

## 2022-01-25 DIAGNOSIS — Z8673 Personal history of transient ischemic attack (TIA), and cerebral infarction without residual deficits: Secondary | ICD-10-CM

## 2022-01-25 DIAGNOSIS — K922 Gastrointestinal hemorrhage, unspecified: Secondary | ICD-10-CM | POA: Diagnosis present

## 2022-01-25 DIAGNOSIS — E119 Type 2 diabetes mellitus without complications: Secondary | ICD-10-CM | POA: Diagnosis present

## 2022-01-25 DIAGNOSIS — R933 Abnormal findings on diagnostic imaging of other parts of digestive tract: Secondary | ICD-10-CM | POA: Diagnosis not present

## 2022-01-25 DIAGNOSIS — D131 Benign neoplasm of stomach: Secondary | ICD-10-CM | POA: Diagnosis not present

## 2022-01-25 DIAGNOSIS — I10 Essential (primary) hypertension: Secondary | ICD-10-CM | POA: Diagnosis present

## 2022-01-25 DIAGNOSIS — J45909 Unspecified asthma, uncomplicated: Secondary | ICD-10-CM | POA: Diagnosis not present

## 2022-01-25 DIAGNOSIS — K317 Polyp of stomach and duodenum: Secondary | ICD-10-CM | POA: Diagnosis not present

## 2022-01-25 DIAGNOSIS — D500 Iron deficiency anemia secondary to blood loss (chronic): Secondary | ICD-10-CM | POA: Diagnosis not present

## 2022-01-25 DIAGNOSIS — D122 Benign neoplasm of ascending colon: Secondary | ICD-10-CM | POA: Diagnosis not present

## 2022-01-25 DIAGNOSIS — R002 Palpitations: Secondary | ICD-10-CM | POA: Diagnosis not present

## 2022-01-25 DIAGNOSIS — Z823 Family history of stroke: Secondary | ICD-10-CM

## 2022-01-25 DIAGNOSIS — D696 Thrombocytopenia, unspecified: Secondary | ICD-10-CM | POA: Diagnosis present

## 2022-01-25 DIAGNOSIS — K259 Gastric ulcer, unspecified as acute or chronic, without hemorrhage or perforation: Secondary | ICD-10-CM | POA: Diagnosis not present

## 2022-01-25 DIAGNOSIS — R634 Abnormal weight loss: Secondary | ICD-10-CM | POA: Diagnosis not present

## 2022-01-25 DIAGNOSIS — I700 Atherosclerosis of aorta: Secondary | ICD-10-CM | POA: Diagnosis not present

## 2022-01-25 DIAGNOSIS — K2950 Unspecified chronic gastritis without bleeding: Secondary | ICD-10-CM | POA: Diagnosis not present

## 2022-01-25 DIAGNOSIS — J9811 Atelectasis: Secondary | ICD-10-CM | POA: Diagnosis not present

## 2022-01-25 DIAGNOSIS — I2510 Atherosclerotic heart disease of native coronary artery without angina pectoris: Secondary | ICD-10-CM | POA: Diagnosis not present

## 2022-01-25 DIAGNOSIS — R5383 Other fatigue: Secondary | ICD-10-CM | POA: Diagnosis not present

## 2022-01-25 DIAGNOSIS — D509 Iron deficiency anemia, unspecified: Secondary | ICD-10-CM | POA: Diagnosis not present

## 2022-01-25 LAB — IRON AND TIBC
Iron: 9 ug/dL — ABNORMAL LOW (ref 28–170)
Saturation Ratios: 2 % — ABNORMAL LOW (ref 10.4–31.8)
TIBC: 560 ug/dL — ABNORMAL HIGH (ref 250–450)
UIBC: 551 ug/dL

## 2022-01-25 LAB — TECHNOLOGIST SMEAR REVIEW

## 2022-01-25 LAB — BASIC METABOLIC PANEL
Anion gap: 10 (ref 5–15)
BUN: 14 mg/dL (ref 8–23)
CO2: 24 mmol/L (ref 22–32)
Calcium: 9.2 mg/dL (ref 8.9–10.3)
Chloride: 107 mmol/L (ref 98–111)
Creatinine, Ser: 1.06 mg/dL — ABNORMAL HIGH (ref 0.44–1.00)
GFR, Estimated: 55 mL/min — ABNORMAL LOW (ref >60)
Glucose, Bld: 218 mg/dL — ABNORMAL HIGH (ref 70–99)
Potassium: 3.5 mmol/L (ref 3.5–5.1)
Sodium: 141 mmol/L (ref 135–145)

## 2022-01-25 LAB — LACTATE DEHYDROGENASE: LDH: 295 U/L — ABNORMAL HIGH (ref 98–192)

## 2022-01-25 LAB — HEPATIC FUNCTION PANEL
ALT: 29 U/L (ref 0–44)
AST: 63 U/L — ABNORMAL HIGH (ref 15–41)
Albumin: 3.8 g/dL (ref 3.5–5.0)
Alkaline Phosphatase: 36 U/L — ABNORMAL LOW (ref 38–126)
Bilirubin, Direct: 0.2 mg/dL (ref 0.0–0.2)
Indirect Bilirubin: 1.1 mg/dL — ABNORMAL HIGH (ref 0.3–0.9)
Total Bilirubin: 1.3 mg/dL — ABNORMAL HIGH (ref 0.3–1.2)
Total Protein: 7.3 g/dL (ref 6.5–8.1)

## 2022-01-25 LAB — POC OCCULT BLOOD, ED: Fecal Occult Bld: NEGATIVE

## 2022-01-25 LAB — CBC
HCT: 19.3 % — ABNORMAL LOW (ref 36.0–46.0)
Hemoglobin: 4.9 g/dL — CL (ref 12.0–15.0)
MCH: 14.6 pg — ABNORMAL LOW (ref 26.0–34.0)
MCHC: 25.4 g/dL — ABNORMAL LOW (ref 30.0–36.0)
MCV: 57.4 fL — ABNORMAL LOW (ref 80.0–100.0)
Platelets: 378 10*3/uL (ref 150–400)
RBC: 3.36 MIL/uL — ABNORMAL LOW (ref 3.87–5.11)
RDW: 25.4 % — ABNORMAL HIGH (ref 11.5–15.5)
WBC: 7.9 10*3/uL (ref 4.0–10.5)
nRBC: 0 % (ref 0.0–0.2)

## 2022-01-25 LAB — TSH: TSH: 0.7 u[IU]/mL (ref 0.350–4.500)

## 2022-01-25 LAB — PROTIME-INR
INR: 1.1 (ref 0.8–1.2)
Prothrombin Time: 14.2 seconds (ref 11.4–15.2)

## 2022-01-25 LAB — TROPONIN I (HIGH SENSITIVITY)
Troponin I (High Sensitivity): 4 ng/L (ref <18)
Troponin I (High Sensitivity): 5 ng/L (ref <18)

## 2022-01-25 LAB — RETICULOCYTES
Immature Retic Fract: 28.8 % — ABNORMAL HIGH (ref 2.3–15.9)
RBC.: 3.39 MIL/uL — ABNORMAL LOW (ref 3.87–5.11)
Retic Count, Absolute: 73.9 10*3/uL (ref 19.0–186.0)
Retic Ct Pct: 2.2 % (ref 0.4–3.1)

## 2022-01-25 LAB — ABO/RH: ABO/RH(D): A POS

## 2022-01-25 LAB — VITAMIN B12: Vitamin B-12: 290 pg/mL (ref 180–914)

## 2022-01-25 LAB — PREPARE RBC (CROSSMATCH)

## 2022-01-25 LAB — APTT: aPTT: 23 seconds — ABNORMAL LOW (ref 24–36)

## 2022-01-25 MED ORDER — ACETAMINOPHEN 325 MG PO TABS: 650.0000 mg | ORAL_TABLET | Freq: Four times a day (QID) | ORAL | Status: DC | PRN

## 2022-01-25 MED ORDER — SODIUM CHLORIDE 0.9 % IV SOLN
10.0000 mL/h | Freq: Once | INTRAVENOUS | Status: AC
  Administered 2022-01-25: 10 mL/h via INTRAVENOUS

## 2022-01-25 MED ORDER — ACETAMINOPHEN 650 MG RE SUPP: 650.0000 mg | Freq: Four times a day (QID) | RECTAL | Status: DC | PRN

## 2022-01-25 MED ORDER — METOPROLOL SUCCINATE ER 100 MG PO TB24
100.0000 mg | ORAL_TABLET | Freq: Every day | ORAL | Status: DC
  Administered 2022-01-26 – 2022-01-30 (×5): 100 mg via ORAL
  Filled 2022-01-25 (×5): qty 1

## 2022-01-25 MED ORDER — LEVOTHYROXINE SODIUM 100 MCG PO TABS
100.0000 ug | ORAL_TABLET | Freq: Every day | ORAL | Status: DC
  Administered 2022-01-26 – 2022-01-30 (×4): 100 ug via ORAL
  Filled 2022-01-25 (×3): qty 1

## 2022-01-25 MED ORDER — IRBESARTAN 75 MG PO TABS
150.0000 mg | ORAL_TABLET | Freq: Every day | ORAL | Status: DC
  Administered 2022-01-26 – 2022-01-28 (×3): 150 mg via ORAL
  Filled 2022-01-25 (×3): qty 2

## 2022-01-25 MED ORDER — IRBESARTAN 300 MG PO TABS: 300.0000 mg | ORAL_TABLET | Freq: Every day | ORAL | Status: DC

## 2022-01-25 MED ORDER — AMLODIPINE BESYLATE 10 MG PO TABS
10.0000 mg | ORAL_TABLET | Freq: Every day | ORAL | Status: DC
  Administered 2022-01-26 – 2022-01-30 (×5): 10 mg via ORAL
  Filled 2022-01-25 (×5): qty 1

## 2022-01-25 NOTE — ED Provider Notes (Signed)
Presents to urgent care with chest pain, shortness of breath, palpitations.  Seen in triage.  Blood pressure is elevated 200/63, tachycardic in the 130s.  Patient states she feels there is a pressure on her chest and is having trouble breathing.  Hx of atrial fibrillation.  Due to patient's symptoms and vitals recommended she go to the emergency department for further evaluation and treatment, higher level of care.  Offered EMS transport which patient declined. Patient's granddaughter is here and will transport her via personal vehicle.  Patient discharged to the emergency department via personal vehicle in stable condition considering capabilities of this urgent care.      Kala Ambriz, Vernice Jefferson 01/25/22 1519

## 2022-01-25 NOTE — ED Triage Notes (Signed)
Provider to triage, pt will be sent to ED via POV.

## 2022-01-25 NOTE — H&P (Addendum)
Date: 01/25/2022               Patient Name:  Madison Jefferson MRN: [id number]  DOB: 1951-07-25 Age / Sex: 71 y.o., female   PCP: Aldine Contes, MD         Medical Service: Internal Medicine Teaching Service         Attending Physician: Dr. Velna Ochs, MD    First Contact: Dr. France Ravens Pager: [phone]  Second Contact: Dr. Gaylan Gerold Pager: [phone]       After Hours (After 5p/  First Contact Pager: [phone]  weekends / holidays): Second Contact Pager: [phone]   Chief Complaint: Dyspnea on exertion  History of Present Illness:   Madison Jefferson is a 71 year old female with past medical history of type 2 diabetes, hypothyroidism, asthma, hypertension, possible history of atrial fibrillation, who presents to the ED for 4 weeks of dyspnea and chest tightness on exertion.  The symptoms better with rest.  She decided to come today due to worsening of her palpitation with exertion.    She endorses remote history of constipation and but currently has normalized.  She denies melena or hematochezia.  Denies any change in stool caliber or pattern.  She has 1-2 bowel meds a day, loose stools occasionally.  She denies symptoms of heartburn or abdominal pain, nausea or vomiting.  Denies flatulence or early satiety.  Patient denies any wound or rash or broken skin.  Denies any bruising.  Denies any overt signs of bleeding including vaginal bleeding, gum bleeding, oral bleeding.  Denies any new joint pain, bone pain or back pain.  No coughing.  She states that she has been craving for ice chips recently.  She denies fever or night sweats.  She states that she has been losing weight faster than expected.  Madison Jefferson that she was 190 last year and 160 pounds this year.   Patient is not on a blood thinner.  Only on aspirin 81 mg daily.  In the ED, hemoglobin came back at 4.9, microcytic.  BMP unremarkable.  Meds:  Current Meds  Medication Sig   albuterol (VENTOLIN HFA) 108 (90 Base) MCG/ACT  inhaler INHALE 1 TO 2 PUFFS INTO THE LUNGS EVERY 6 HOURS AS NEEDED FOR WHEEZING OR SHORTNESS OF BREATH (Patient taking differently: Inhale 1-2 puffs into the lungs every 6 (six) hours as needed for wheezing or shortness of breath.)   aspirin (ASPIRIN LOW DOSE) 81 MG EC tablet TAKE 1 TABLET(81 MG) BY MOUTH DAILY. SWALLOW WHOLE (Patient taking differently: Take 81 mg by mouth daily. Swallow whole)   cholecalciferol (VITAMIN D3) 25 MCG (1000 UNIT) tablet Take 1 tablet (1,000 Units total) by mouth daily.   levothyroxine (SYNTHROID) 100 MCG tablet TAKE 1 TABLET(100 MCG) BY MOUTH DAILY (Patient taking differently: Take 100 mcg by mouth daily before breakfast.)   loratadine (CLARITIN) 10 MG tablet TAKE 1 TABLET(10 MG) BY MOUTH DAILY (Patient taking differently: Take 10 mg by mouth daily.)   metFORMIN (GLUCOPHAGE-XR) 500 MG 24 hr tablet Take 1 tablet in the morning with breakfast and 2 tablets at night with dinner (Patient taking differently: Take 500-1,000 mg by mouth See admin instructions. Take 1 tablet (500 mg) in the morning with breakfast and 2 tablets (1000 mg) at night with dinner)   mometasone (NASONEX) 50 MCG/ACT nasal spray Place 2 sprays into the nose daily.   Olmesartan-amLODIPine-HCTZ 40-10-25 MG TABS TAKE 1 TABLET BY MOUTH DAILY     Allergies: Allergies as of  01/25/2022 - Review Complete 01/25/2022  Allergen Reaction Noted   Oysters [shellfish allergy] Rash 01/28/2012   Past Medical History:  Diagnosis Date   Allergy    Arthritis    Asthma    Atrial fibrillation ([place])    h/o of isolated AF associated with hypothyroidism    Diverticulosis    cecum   Diverticulosis    colonoscopy 2002 [facility]   Dysphagia 06/23/2021   Fatty liver    GERD (gastroesophageal reflux disease)    Hypertension    Lumbar radiculopathy    Thyroid disease    hypothyroid   TIA (transient ischemic attack)    2006   Urinary incontinence    mixed urge and stress    Vitamin D deficiency     Family  History:  Family History  Problem Relation Age of Onset   Diabetes Mother    Stroke Mother    Cancer Mother        mother <50 at diagnosis (breast); h/o oral cancer   Diabetes Father    Heart disease Father    Stroke Father    Hypertension Father    Diabetes Brother    Colon cancer Brother    Diabetes Sister    Diabetes Paternal Uncle    Cancer Brother        lung   Cancer Brother        throat   Esophageal cancer Neg Hx    Rectal cancer Neg Hx    Stomach cancer Neg Hx     No family history of colon cancer  Surgery: cataract, cholecystectomy  Social History:  No alcohol, smoking or drug use. Live alone, independent Sister and granddaughter in the area.   Review of Systems: A complete ROS was negative except as per HPI.   Physical Exam: Blood pressure (!) 169/59, pulse (!) 104, temperature 98.6 F (37 C), temperature source Oral, resp. rate 20, SpO2 100 %. Physical Exam Constitutional:      General: She is not in acute distress.    Appearance: She is not ill-appearing.  HENT:     Head: Normocephalic.     Mouth/Throat:     Mouth: Mucous membranes are moist.     Comments: No evidence of gingival hemorrhage, or ulcers. Eyes:     Comments: Conjunctival pallor.  No scleral anicteric.  Cardiovascular:     Rate and Rhythm: Regular rhythm. Tachycardia present.     Comments: No lower extremity edema. Pulmonary:     Effort: Pulmonary effort is normal. No respiratory distress.     Breath sounds: Normal breath sounds. No wheezing.  Abdominal:     General: Bowel sounds are normal. There is no distension.     Palpations: Abdomen is soft.     Tenderness: There is no abdominal tenderness.  Musculoskeletal:        General: Normal range of motion.  Skin:    General: Skin is warm.     Comments: No rash, broken skin or pressure ulcer.  No petechial rash noted.  Neurological:     Mental Status: She is alert and oriented to person, place, and time.  Psychiatric:         Mood and Affect: Mood normal.        Behavior: Behavior normal.   GI: No evidence of hernia, bleeding, or lesions on rectal exam   EKG: personally reviewed my interpretation is sinus tachycardia  CXR: personally reviewed my interpretation is IMPRESSION: There are no signs of  pulmonary edema or focal pulmonary consolidation.   There is an ill-defined 1 cm faint opacity in the lateral aspect of right upper lung fields which may suggest an artifact or pleural plaque or parenchymal nodule. Follow-up chest radiographs along with CT if warranted should be considered.  Assessment & Plan by Problem: Principal Problem:   Symptomatic anemia Active Problems:   Hypothyroidism   HYPERTENSION, BENIGN   Diabetes (HCC)  Madison Jefferson is a 71 year old female with past medical history of type 2 diabetes, hypothyroidism, asthma, hypertension, possible history of atrial fibrillation, who presents to the ED for 4 weeks of dyspnea and chest tightness on exertion, needed for symptomatic anemia.  Symptomatic microcytic anemia This appears to be a chronic slow bleed given the duration of his symptoms.  The most common source of bleeding is likely to be GI bleed.  Concern for possible malignancy given her unexpected weight loss and microcytic anemia.  She had a colonoscopy in 2016 which were unremarkable making a GI malignancy less likely. We will also do a work-up for possible hemolytic anemia but less likely at this time.  She is not on any medications that would increase risk for hemolytic anemia. -Transfused 2 units in the ED, post H&H  -LFT, LDH, haptoglobin, direct Coombs test, iron study, B12, reticulocyte count and peripheral smear ordered -Obtain urine to look for hemoglobin or bilirubin -Will consult GI in a.m. for colonoscopy/EGD  Lung lesion Chest x-ray showed 1 cm faint opacity in the lateral aspect of right upper lung fields which may suggest an artifact or pleural plaque or parenchymal nodule.   -Can obtain chest CT when stable  Hypothyroidism -Resume levothyroxine -TSH ordered  Hypertension -Resume home amlodipine, metoprolol, and ARB  Type 2 diabetes A1c 2 months ago was 6 -Holding home metformin -CBG daily  Diet: Clear liquid diet.   Code: Full code DVT: SCD IVF: N/A  Dispo: Admit patient to Observation with expected length of stay less than 2 midnights.  Signed: France Ravens, MD 01/25/2022, 7:39 PM  Pager: [phone] After 5pm on weekdays and 1pm on weekends: On Call pager: [phone]

## 2022-01-25 NOTE — ED Provider Notes (Deleted)
[facility]    CSN: [id number] Arrival date & time: 01/25/22  1456      History   Chief Complaint Chief Complaint  Patient presents with   Palpitations   Shortness of Breath    HPI Madison Jefferson is a 71 y.o. female.  Presents to urgent care with chest pain, shortness of breath, palpitations.  Seen in triage.  Blood pressure is elevated 200/63, tachycardic in the 130s.  Patient states she feels there is a pressure on her chest and is having trouble breathing.  She does have a history of atrial fibrillation.  Past Medical History:  Diagnosis Date   Allergy    Arthritis    Asthma    Atrial fibrillation ([place])    h/o of isolated AF associated with hypothyroidism    Diverticulosis    cecum   Diverticulosis    colonoscopy 2002 [facility]   Dysphagia 06/23/2021   Fatty liver    GERD (gastroesophageal reflux disease)    Hypertension    Lumbar radiculopathy    Thyroid disease    hypothyroid   TIA (transient ischemic attack)    2006   Urinary incontinence    mixed urge and stress    Vitamin D deficiency     Patient Active Problem List   Diagnosis Date Noted   Right abducens nerve palsy 11/15/2019   Adhesive capsulitis of right shoulder 10/25/2016   Osteoarthritis of left glenohumeral joint 06/13/2016   Degenerative arthritis of thumb, left 06/01/2016   Right knee pain 07/09/2014   Diabetes ([place]) 09/20/2013   Diverticulosis    Obesity, unspecified 10/12/2012   Health care maintenance 07/03/2012   Vitamin D deficiency 12/24/2009   Fatty liver 10/29/2009   Allergic rhinitis 10/21/2009   GERD 10/21/2009   Asthma 07/23/2009   Hypothyroidism 09/05/2008   HYPERTENSION, BENIGN 09/25/2007   BACK PAIN WITH RADICULOPATHY 09/25/2007    Past Surgical History:  Procedure Laterality Date   ABLATION     thyroid with radioactive iodine ablation   CARPAL TUNNEL RELEASE     left wrist   CHOLECYSTECTOMY     LEFT HEART CATHETERIZATION WITH CORONARY ANGIOGRAM N/A  01/28/2012   Procedure: LEFT HEART CATHETERIZATION WITH CORONARY ANGIOGRAM;  Surgeon: Birdie Riddle, MD;  Location: [facility] CATH LAB;  Service: Cardiovascular;  Laterality: N/A;   OTHER SURGICAL HISTORY     carpel tunnel surgery    OTHER SURGICAL HISTORY     carpel tunnel repair   RIGHT HEART CATH  2005-Dr. Doylene Canard   minimal CAD; left circumflex dominant with 10-15% proximal lesion;; normal LVF   TUBAL LIGATION     VAGINAL HYSTERECTOMY     partial hysterectomy 1983 vaginal bleeding    OB History   No obstetric history on file.      Home Medications    Prior to Admission medications   Medication Sig Start Date End Date Taking? Authorizing Provider  Accu-Chek Softclix Lancets lancets Check blood sugar up to 1 time a day 06/30/21   Aldine Contes, MD  albuterol (VENTOLIN HFA) 108 (90 Base) MCG/ACT inhaler INHALE 1 TO 2 PUFFS INTO THE LUNGS EVERY 6 HOURS AS NEEDED FOR WHEEZING OR SHORTNESS OF BREATH 11/04/21   Aldine Contes, MD  aspirin (ASPIRIN LOW DOSE) 81 MG EC tablet TAKE 1 TABLET(81 MG) BY MOUTH DAILY. SWALLOW WHOLE 10/02/21   Axel Filler, MD  Blood Glucose Monitoring Suppl (ACCU-CHEK GUIDE) w/Device KIT Check blood sugar up to 1 time a day 06/30/21  Aldine Contes, MD  cholecalciferol (VITAMIN D3) 25 MCG (1000 UNIT) tablet Take 1 tablet (1,000 Units total) by mouth daily. 11/04/21   Aldine Contes, MD  glucose blood (ACCU-CHEK GUIDE) test strip Check blood sugar up to 1 time per day 06/30/21   Aldine Contes, MD  levothyroxine (SYNTHROID) 100 MCG tablet TAKE 1 TABLET(100 MCG) BY MOUTH DAILY 01/06/22   Aldine Contes, MD  loratadine (CLARITIN) 10 MG tablet TAKE 1 TABLET(10 MG) BY MOUTH DAILY 09/22/21   Aldine Contes, MD  metFORMIN (GLUCOPHAGE-XR) 500 MG 24 hr tablet Take 1 tablet in the morning with breakfast and 2 tablets at night with dinner 11/04/21   Aldine Contes, MD  metoprolol succinate (TOPROL-XL) 100 MG 24 hr tablet TAKE 1 TABLET BY MOUTH EVERY DAY  WITH OR IMMEDIATELY FOLLOWING A MEAL 01/06/22   Aldine Contes, MD  mometasone (NASONEX) 50 MCG/ACT nasal spray Place 2 sprays into the nose daily. 07/15/20   Aldine Contes, MD  Olmesartan-amLODIPine-HCTZ 40-10-25 MG TABS TAKE 1 TABLET BY MOUTH DAILY 01/07/22   Aldine Contes, MD    Family History Family History  Problem Relation Age of Onset   Diabetes Mother    Stroke Mother    Cancer Mother        mother <50 at diagnosis (breast); h/o oral cancer   Diabetes Father    Heart disease Father    Stroke Father    Hypertension Father    Diabetes Brother    Colon cancer Brother    Diabetes Sister    Diabetes Paternal Uncle    Cancer Brother        lung   Cancer Brother        throat   Esophageal cancer Neg Hx    Rectal cancer Neg Hx    Stomach cancer Neg Hx     Social History Social History   Tobacco Use   Smoking status: Never   Smokeless tobacco: Never  Substance Use Topics   Alcohol use: No    Alcohol/week: 0.0 standard drinks of alcohol   Drug use: No     Allergies   Oysters [shellfish allergy]   Review of Systems  Per HPI  Physical Exam Triage Vital Signs ED Triage Vitals [01/25/22 1504]  Enc Vitals Group     BP (!) 200/63     Pulse Rate (!) 130     Resp (!) 22     Temp      Temp src      SpO2 100 %     Weight      Height      Head Circumference      Peak Flow      Pain Score      Pain Loc      Pain Edu?      Excl. in [place]?    No data found.  Updated Vital Signs BP (!) 200/63 (BP Location: Right Arm)   Pulse (!) 130   Resp (!) 22   SpO2 100%     Physical Exam Vitals reviewed.  Constitutional:      Appearance: She is not ill-appearing.  Cardiovascular:     Rate and Rhythm: Regular rhythm. Tachycardia present.     Pulses:          Radial pulses are 2+ on the left side.  Pulmonary:     Effort: Pulmonary effort is normal.  Skin:    General: Skin is warm and dry.  Neurological:     Mental  Status: She is alert and oriented to  person, place, and time.     UC Treatments / Results  Labs (all labs ordered are listed, but only abnormal results are displayed) Labs Reviewed - No data to display  EKG  Radiology No results found.  Procedures Procedures   Medications Ordered in UC Medications - No data to display  Initial Impression / Assessment and Plan / UC Course  I have reviewed the triage vital signs and the nursing notes.  Pertinent labs & imaging results that were available during my care of the patient were reviewed by me and considered in my medical decision making (see chart for details).  Saw patient in triage. Due to patient's symptoms and vitals I do recommend that she go to the emergency department for further evaluation and treatment.  Offered EMS transport which patient declined. Patient's granddaughter is here and will transport her via personal vehicle.  Patient discharged to the emergency department via personal vehicle in stable condition considering capabilities of this urgent care.  Final Clinical Impressions(s) / UC Diagnoses   Final diagnoses:  Shortness of breath  Heart palpitations  Chest pain, unspecified type     Discharge Instructions      D/C to ED via POV     ED Prescriptions   None    PDMP not reviewed this encounter.   Roxas Clymer, Vernice Jefferson 01/25/22 1516

## 2022-01-25 NOTE — ED Triage Notes (Signed)
Pt arrives from [facility] triage for eval of chest pain, sob, and lightheadedness with exertion x several weeks.

## 2022-01-25 NOTE — Discharge Instructions (Signed)
D/C to ED via POV

## 2022-01-25 NOTE — ED Triage Notes (Signed)
Pt presents with feeling of heart racing and SOB.  Mostly occurs when up walking.  Also c/o dizziness, nausea, feeling of needing to have BM with these episodes.  Reports fatigue and chest tightness at this time.

## 2022-01-25 NOTE — ED Provider Notes (Signed)
[facility]PART[facility] Provider Note   CSN: [id number] Arrival date & time: 01/25/22  1511     History  Chief Complaint  Patient presents with   Chest Pain    Madison Jefferson is a 71 y.o. female with a past medical history of A-fib, diabetes who presents to the emergency department with concerns for chest pain onset several weeks.  Has associated shortness of breath, palpitations, lightheadedness.  No meds tried prior to arrival to the ED.  Denies past medical history of iron deficiency anemia.  Patient has a past medical history of diabetes and was recently placed on metformin.  Denies past medical history of MI, stents, cardiac catheterization, CAD.  The history is provided by the patient. No language interpreter was used.       Home Medications Prior to Admission medications   Medication Sig Start Date End Date Taking? Authorizing Provider  Accu-Chek Softclix Lancets lancets Check blood sugar up to 1 time a day 06/30/21   Aldine Contes, MD  albuterol (VENTOLIN HFA) 108 (90 Base) MCG/ACT inhaler INHALE 1 TO 2 PUFFS INTO THE LUNGS EVERY 6 HOURS AS NEEDED FOR WHEEZING OR SHORTNESS OF BREATH 11/04/21   Aldine Contes, MD  aspirin (ASPIRIN LOW DOSE) 81 MG EC tablet TAKE 1 TABLET(81 MG) BY MOUTH DAILY. SWALLOW WHOLE 10/02/21   Axel Filler, MD  Blood Glucose Monitoring Suppl (ACCU-CHEK GUIDE) w/Device KIT Check blood sugar up to 1 time a day 06/30/21   Aldine Contes, MD  cholecalciferol (VITAMIN D3) 25 MCG (1000 UNIT) tablet Take 1 tablet (1,000 Units total) by mouth daily. 11/04/21   Aldine Contes, MD  glucose blood (ACCU-CHEK GUIDE) test strip Check blood sugar up to 1 time per day 06/30/21   Aldine Contes, MD  levothyroxine (SYNTHROID) 100 MCG tablet TAKE 1 TABLET(100 MCG) BY MOUTH DAILY 01/06/22   Aldine Contes, MD  loratadine (CLARITIN) 10 MG tablet TAKE 1 TABLET(10 MG) BY MOUTH DAILY 09/22/21   Aldine Contes, MD  metFORMIN  (GLUCOPHAGE-XR) 500 MG 24 hr tablet Take 1 tablet in the morning with breakfast and 2 tablets at night with dinner 11/04/21   Aldine Contes, MD  metoprolol succinate (TOPROL-XL) 100 MG 24 hr tablet TAKE 1 TABLET BY MOUTH EVERY DAY WITH OR IMMEDIATELY FOLLOWING A MEAL 01/06/22   Aldine Contes, MD  mometasone (NASONEX) 50 MCG/ACT nasal spray Place 2 sprays into the nose daily. 07/15/20   Aldine Contes, MD  Olmesartan-amLODIPine-HCTZ 40-10-25 MG TABS TAKE 1 TABLET BY MOUTH DAILY 01/07/22   Aldine Contes, MD      Allergies    Jeanie Cooks allergy]    Review of Systems   Review of Systems  Respiratory:  Positive for shortness of breath.        -hemoptysis  Cardiovascular:  Positive for chest pain and palpitations.  Gastrointestinal:  Negative for blood in stool.  Genitourinary:  Negative for vaginal bleeding.  Neurological:  Positive for light-headedness. Negative for dizziness.  All other systems reviewed and are negative.   Physical Exam Updated Vital Signs BP (!) 186/66 (BP Location: Right Arm)   Pulse (!) 133   Temp 98.7 F (37.1 C) (Oral)   Resp (!) 24   SpO2 100%  Physical Exam Vitals and nursing note reviewed. Exam conducted with a chaperone present.  Constitutional:      General: She is not in acute distress.    Appearance: Normal appearance. She is not ill-appearing, toxic-appearing or diaphoretic.  HENT:  Head: Normocephalic and atraumatic.     Right Ear: External ear normal.     Left Ear: External ear normal.     Mouth/Throat:     Pharynx: No oropharyngeal exudate.  Eyes:     General: No scleral icterus.    Extraocular Movements: Extraocular movements intact.     Conjunctiva/sclera: Conjunctivae normal.  Cardiovascular:     Rate and Rhythm: Regular rhythm. Tachycardia present.     Pulses: Normal pulses.     Heart sounds: Normal heart sounds.  Pulmonary:     Effort: Pulmonary effort is normal. No respiratory distress.     Breath sounds:  Normal breath sounds. No wheezing.  Abdominal:     General: Abdomen is flat. Bowel sounds are normal. There is no distension.     Palpations: Abdomen is soft. There is no mass.     Tenderness: There is no abdominal tenderness. There is no guarding or rebound.  Genitourinary:    Rectum: External hemorrhoid present. No mass, tenderness or anal fissure.     Comments: RN, Alana present for rectal exam.  External hemorrhoid noted on exam, no tenderness to palpation during rectal exam.  No notable anal fissure. No gross blood noted to rectum.  Musculoskeletal:        General: Normal range of motion.     Cervical back: Normal range of motion and neck supple.  Skin:    General: Skin is warm and dry.  Neurological:     Mental Status: She is alert.  Psychiatric:        Behavior: Behavior normal.     ED Results / Procedures / Treatments   Labs (all labs ordered are listed, but only abnormal results are displayed) Labs Reviewed  BASIC METABOLIC PANEL - Abnormal; Notable for the following components:      Result Value   Glucose, Bld 218 (*)    Creatinine, Ser 1.06 (*)    GFR, Estimated 55 (*)    All other components within normal limits  CBC - Abnormal; Notable for the following components:   RBC 3.36 (*)    Hemoglobin 4.9 (*)    HCT 19.3 (*)    MCV 57.4 (*)    MCH 14.6 (*)    MCHC 25.4 (*)    RDW 25.4 (*)    All other components within normal limits  PROTIME-INR  POC OCCULT BLOOD, ED  TYPE AND SCREEN  TROPONIN I (HIGH SENSITIVITY)  TROPONIN I (HIGH SENSITIVITY)    EKG None  Radiology DG Chest 2 View  Result Date: 01/25/2022 CLINICAL DATA:  Chest pain EXAM: CHEST - 2 VIEW COMPARISON:  08/11/2015 FINDINGS: Cardiac size is within normal limits. There are no signs of pulmonary edema or focal pulmonary consolidation. In the PA view, there is an ill-defined 1 cm faint density in the lateral aspect of right apex overlying the anterior right second rib. Rest of the lung fields are  unremarkable. There is no pleural effusion or pneumothorax. IMPRESSION: There are no signs of pulmonary edema or focal pulmonary consolidation. There is an ill-defined 1 cm faint opacity in the lateral aspect of right upper lung fields which may suggest an artifact or pleural plaque or parenchymal nodule. Follow-up chest radiographs along with CT if warranted should be considered. Electronically Signed   By: Elmer Picker M.D.   On: 01/25/2022 16:07    Procedures Procedures    Medications Ordered in ED Medications  0.9 %  sodium chloride infusion (has no administration in  time range)    ED Course/ Medical Decision Making/ A&P Clinical Course as of 01/25/22 1818  Mon Jan 25, 2022  1743 Consult with internal medicine Dr. Alfonse Spruce who notes they will evaluate the patient for admission [SB]    Clinical Course User Index [SB] Adriauna Campton A, PA-C                           Medical Decision Making Amount and/or Complexity of Data Reviewed Labs: ordered. Radiology: ordered.  Risk Prescription drug management. Decision regarding hospitalization.   Pt presents with chest pain, shortness of breath, palpitations, lightheadedness onset several weeks.  Denies past medical history of iron deficiency anemia.  No meds tried prior to arrival.  No past medical history of MI, stents, cardiac catheterization, CAD.  Vital signs, stable, patient afebrile, slightly tachycardic at 111. On exam, pt with no acute cardiovascular, respiratory, abdominal exam findings. External hemorrhoid noted on exam, no tenderness to palpation during rectal exam.  No notable anal fissure. No gross blood noted to rectum.  Differential diagnosis includes ACS, arrhythmia, electrolyte abnormality, pneumonia, anemia.    Co morbidities that complicate the patient evaluation: Diabetes, hypertension, atrial fibrillation  Additional history obtained:  External records from outside source obtained and reviewed including:  Patient was briefly evaluated at urgent care prior to arrival to the ED and informed to come into the emergency department for further evaluation of her symptoms  Labs:  I ordered, and personally interpreted labs.  The pertinent results include:   Fecal occult blood negative. Initial troponin of 4, delta troponin at 5 Protime-INR unremarkable Type and screen obtained CBC with hemoglobin critically at 4.9 otherwise no leukocytosis. BNP is slightly elevated glucose of 218 (patient has only had 1 dose of her metformin today), creatinine slightly elevated at 1.06 otherwise unremarkable.  Imaging: I ordered imaging studies including chest x-ray I independently visualized and interpreted imaging which showed: Ill-defined 1 cm faint opacity to the right upper lung I agree with the radiologist interpretation   Critical Interventions 2 units of pRBCs   Consultations: I requested consultation with the Hospitalist, Dr. Alfonse Spruce and discussed lab and imaging findings as well as pertinent plan - they recommend: admission   Disposition: Presentation suspicious for symptomatic anemia.  Doubt ACS, pneumonia, arrhythmia, at this time. After consideration of the diagnostic results and the patients response to treatment, I feel that the patient would benefit from Admission to the hospital.  Discussed with patient admission plans, patient agreeable at this time.  Patient appears safe for admission at this time.  This chart was dictated using voice recognition software, Dragon. Despite the best efforts of this provider to proofread and correct errors, errors may still occur which can change documentation meaning.  Final Clinical Impression(s) / ED Diagnoses Final diagnoses:  Symptomatic anemia    Rx / DC Orders ED Discharge Orders     None         Verenise Moulin A, PA-C 01/25/22 1842    Valarie Merino, MD 01/26/22 0045

## 2022-01-25 NOTE — Telephone Encounter (Signed)
Patient called in stating she is "fine while sitting, but when I get up and walk around, my heart gets tight and I feel SHOB. Also, c/o "feeling drunk" with standing (dizzy, lightheaded, heart beating faster, weak). This has been going on for 1 month. Per PCP, patient advised to head directly to ED. She will have her son drive her now.

## 2022-01-26 ENCOUNTER — Encounter (HOSPITAL_COMMUNITY): Payer: Self-pay | Admitting: Internal Medicine

## 2022-01-26 ENCOUNTER — Other Ambulatory Visit: Payer: Self-pay

## 2022-01-26 ENCOUNTER — Observation Stay (HOSPITAL_COMMUNITY): Payer: Medicare Other

## 2022-01-26 DIAGNOSIS — J9811 Atelectasis: Secondary | ICD-10-CM | POA: Diagnosis not present

## 2022-01-26 DIAGNOSIS — E876 Hypokalemia: Secondary | ICD-10-CM | POA: Diagnosis present

## 2022-01-26 DIAGNOSIS — R634 Abnormal weight loss: Secondary | ICD-10-CM | POA: Diagnosis not present

## 2022-01-26 DIAGNOSIS — K922 Gastrointestinal hemorrhage, unspecified: Secondary | ICD-10-CM | POA: Diagnosis present

## 2022-01-26 DIAGNOSIS — Z7982 Long term (current) use of aspirin: Secondary | ICD-10-CM | POA: Diagnosis not present

## 2022-01-26 DIAGNOSIS — Z79899 Other long term (current) drug therapy: Secondary | ICD-10-CM | POA: Diagnosis not present

## 2022-01-26 DIAGNOSIS — Z8673 Personal history of transient ischemic attack (TIA), and cerebral infarction without residual deficits: Secondary | ICD-10-CM | POA: Diagnosis not present

## 2022-01-26 DIAGNOSIS — M1990 Unspecified osteoarthritis, unspecified site: Secondary | ICD-10-CM | POA: Diagnosis present

## 2022-01-26 DIAGNOSIS — Z90711 Acquired absence of uterus with remaining cervical stump: Secondary | ICD-10-CM | POA: Diagnosis not present

## 2022-01-26 DIAGNOSIS — J45909 Unspecified asthma, uncomplicated: Secondary | ICD-10-CM | POA: Diagnosis present

## 2022-01-26 DIAGNOSIS — D649 Anemia, unspecified: Secondary | ICD-10-CM

## 2022-01-26 DIAGNOSIS — K5730 Diverticulosis of large intestine without perforation or abscess without bleeding: Secondary | ICD-10-CM | POA: Diagnosis present

## 2022-01-26 DIAGNOSIS — Z833 Family history of diabetes mellitus: Secondary | ICD-10-CM | POA: Diagnosis not present

## 2022-01-26 DIAGNOSIS — Z9851 Tubal ligation status: Secondary | ICD-10-CM | POA: Diagnosis not present

## 2022-01-26 DIAGNOSIS — I10 Essential (primary) hypertension: Secondary | ICD-10-CM | POA: Diagnosis present

## 2022-01-26 DIAGNOSIS — K317 Polyp of stomach and duodenum: Secondary | ICD-10-CM | POA: Diagnosis not present

## 2022-01-26 DIAGNOSIS — K635 Polyp of colon: Secondary | ICD-10-CM | POA: Diagnosis present

## 2022-01-26 DIAGNOSIS — I700 Atherosclerosis of aorta: Secondary | ICD-10-CM | POA: Diagnosis not present

## 2022-01-26 DIAGNOSIS — K529 Noninfective gastroenteritis and colitis, unspecified: Secondary | ICD-10-CM | POA: Diagnosis present

## 2022-01-26 DIAGNOSIS — E039 Hypothyroidism, unspecified: Secondary | ICD-10-CM | POA: Diagnosis present

## 2022-01-26 DIAGNOSIS — Z91013 Allergy to seafood: Secondary | ICD-10-CM | POA: Diagnosis not present

## 2022-01-26 DIAGNOSIS — D500 Iron deficiency anemia secondary to blood loss (chronic): Secondary | ICD-10-CM | POA: Diagnosis present

## 2022-01-26 DIAGNOSIS — E119 Type 2 diabetes mellitus without complications: Secondary | ICD-10-CM | POA: Diagnosis present

## 2022-01-26 DIAGNOSIS — K449 Diaphragmatic hernia without obstruction or gangrene: Secondary | ICD-10-CM | POA: Diagnosis present

## 2022-01-26 DIAGNOSIS — Z79890 Hormone replacement therapy (postmenopausal): Secondary | ICD-10-CM | POA: Diagnosis not present

## 2022-01-26 DIAGNOSIS — D696 Thrombocytopenia, unspecified: Secondary | ICD-10-CM | POA: Diagnosis present

## 2022-01-26 DIAGNOSIS — I2510 Atherosclerotic heart disease of native coronary artery without angina pectoris: Secondary | ICD-10-CM | POA: Diagnosis not present

## 2022-01-26 DIAGNOSIS — I4891 Unspecified atrial fibrillation: Secondary | ICD-10-CM | POA: Diagnosis present

## 2022-01-26 DIAGNOSIS — Z7984 Long term (current) use of oral hypoglycemic drugs: Secondary | ICD-10-CM | POA: Diagnosis not present

## 2022-01-26 LAB — HEMOGLOBIN AND HEMATOCRIT, BLOOD
HCT: 24.1 % — ABNORMAL LOW (ref 36.0–46.0)
Hemoglobin: 7.2 g/dL — ABNORMAL LOW (ref 12.0–15.0)

## 2022-01-26 LAB — COMPREHENSIVE METABOLIC PANEL
ALT: 25 U/L (ref 0–44)
AST: 26 U/L (ref 15–41)
Albumin: 3.6 g/dL (ref 3.5–5.0)
Alkaline Phosphatase: 33 U/L — ABNORMAL LOW (ref 38–126)
Anion gap: 15 (ref 5–15)
BUN: 12 mg/dL (ref 8–23)
CO2: 22 mmol/L (ref 22–32)
Calcium: 8.9 mg/dL (ref 8.9–10.3)
Chloride: 101 mmol/L (ref 98–111)
Creatinine, Ser: 0.9 mg/dL (ref 0.44–1.00)
GFR, Estimated: >60 mL/min (ref >60)
Glucose, Bld: 110 mg/dL — ABNORMAL HIGH (ref 70–99)
Potassium: 3.5 mmol/L (ref 3.5–5.1)
Sodium: 138 mmol/L (ref 135–145)
Total Bilirubin: 2.7 mg/dL — ABNORMAL HIGH (ref 0.3–1.2)
Total Protein: 6.4 g/dL — ABNORMAL LOW (ref 6.5–8.1)

## 2022-01-26 LAB — PROTIME-INR
INR: 1.3 — ABNORMAL HIGH (ref 0.8–1.2)
Prothrombin Time: 15.8 seconds — ABNORMAL HIGH (ref 11.4–15.2)

## 2022-01-26 LAB — URINALYSIS, ROUTINE W REFLEX MICROSCOPIC
Bilirubin Urine: NEGATIVE
Glucose, UA: NEGATIVE mg/dL
Hgb urine dipstick: NEGATIVE
Ketones, ur: NEGATIVE mg/dL
Leukocytes,Ua: NEGATIVE
Nitrite: NEGATIVE
Protein, ur: NEGATIVE mg/dL
Specific Gravity, Urine: 1.011 (ref 1.005–1.030)
pH: 5 (ref 5.0–8.0)

## 2022-01-26 LAB — DIRECT ANTIGLOBULIN TEST (NOT AT ARMC)
DAT, IgG: NEGATIVE
DAT, complement: NEGATIVE

## 2022-01-26 LAB — CBC
HCT: 24.2 % — ABNORMAL LOW (ref 36.0–46.0)
Hemoglobin: 7.4 g/dL — ABNORMAL LOW (ref 12.0–15.0)
MCH: 19.5 pg — ABNORMAL LOW (ref 26.0–34.0)
MCHC: 30.6 g/dL (ref 30.0–36.0)
MCV: 63.9 fL — ABNORMAL LOW (ref 80.0–100.0)
Platelets: 241 10*3/uL (ref 150–400)
RBC: 3.79 MIL/uL — ABNORMAL LOW (ref 3.87–5.11)
RDW: 32.6 % — ABNORMAL HIGH (ref 11.5–15.5)
WBC: 6 10*3/uL (ref 4.0–10.5)
nRBC: 0 % (ref 0.0–0.2)

## 2022-01-26 LAB — BILIRUBIN, FRACTIONATED(TOT/DIR/INDIR)
Bilirubin, Direct: 0.2 mg/dL (ref 0.0–0.2)
Indirect Bilirubin: 2.3 mg/dL — ABNORMAL HIGH (ref 0.3–0.9)
Total Bilirubin: 2.5 mg/dL — ABNORMAL HIGH (ref 0.3–1.2)

## 2022-01-26 LAB — GLUCOSE, CAPILLARY: Glucose-Capillary: 155 mg/dL — ABNORMAL HIGH (ref 70–99)

## 2022-01-26 LAB — FERRITIN: Ferritin: 4 ng/mL — ABNORMAL LOW (ref 11–307)

## 2022-01-26 LAB — HAPTOGLOBIN: Haptoglobin: 82 mg/dL (ref 42–346)

## 2022-01-26 MED ORDER — HYDROCHLOROTHIAZIDE 25 MG PO TABS
25.0000 mg | ORAL_TABLET | Freq: Every day | ORAL | Status: DC
  Administered 2022-01-27 – 2022-01-30 (×4): 25 mg via ORAL
  Filled 2022-01-26 (×4): qty 1

## 2022-01-26 MED ORDER — IOHEXOL 9 MG/ML PO SOLN
ORAL | Status: AC
  Filled 2022-01-26: qty 1000

## 2022-01-26 MED ORDER — IOHEXOL 300 MG/ML  SOLN
100.0000 mL | Freq: Once | INTRAMUSCULAR | Status: AC | PRN
  Administered 2022-01-26: 100 mL via INTRAVENOUS

## 2022-01-26 MED ORDER — SODIUM CHLORIDE 0.9 % IV SOLN
250.0000 mg | Freq: Once | INTRAVENOUS | Status: AC
  Administered 2022-01-26: 250 mg via INTRAVENOUS
  Filled 2022-01-26: qty 20

## 2022-01-26 MED ORDER — NA FERRIC GLUC CPLX IN SUCROSE 12.5 MG/ML IV SOLN
125.0000 mg | Freq: Once | INTRAVENOUS | Status: AC
  Administered 2022-01-26: 125 mg via INTRAVENOUS
  Filled 2022-01-26: qty 10

## 2022-01-26 NOTE — Plan of Care (Signed)
  Problem: Health Behavior/Discharge Planning: Goal: Ability to manage health-related needs will improve Outcome: Completed/Met

## 2022-01-26 NOTE — Plan of Care (Signed)
  Problem: Education: Goal: Knowledge of General Education information will improve Description: Including pain rating scale, medication(s)/side effects and non-pharmacologic comfort measures Outcome: Completed/Met

## 2022-01-26 NOTE — Progress Notes (Addendum)
Patient Summary Khamiya Varin is a 71 year old female with past medical history of type 2 diabetes, hypothyroidism, asthma, hypertension, possible history of atrial fibrillation, who presents to the ED for 4 weeks of dyspnea and chest tightness on exertion, needed for symptomatic anemia.  Subjective: Interval: 2U blood, 138m ferric gluconate.  On interview this morning, she reports a mild headache. No blurry vision.   She thinks her fatigue is somewhat better. She has not been having significant changes in her bowel habits. No constipation reported. No blood in her stools. Her last pap smear was several years ago. She is due for a pap smear. No abdominal pain, no urinary symptoms.   Objective:  Vital signs in last 24 hours: Vitals:   01/26/22 0200 01/26/22 0230 01/26/22 0240 01/26/22 0300  BP: 138/64 (!) 151/60  (!) 154/60  Pulse: 88 87  90  Resp: 16 (!) 28  20  Temp:   98.3 F (36.8 C) 98.6 F (37 C)  TempSrc:   Oral   SpO2: 98% 97%  100%  Weight:      Height:       Weight change:   Intake/Output Summary (Last 24 hours) at 01/26/2022 0814 Last data filed at 01/26/2022 0600 Gross per 24 hour  Intake 1575.83 ml  Output 0 ml  Net 1575.83 ml   MAR Review: No missed doses  Physical Exam GEN: in no acute distress HEENT: pupils symmetric, EOMI, moist mucus membranes CV: Regular rate and rhythm, heart sounds normal PULM: normal work of breathing ABD: soft, non-tender, bowel sounds present, more fullness in right lower quadrant NEURO: CN II-XII grossly intact  Labs:    Latest Ref Rng & Units 01/26/2022    4:19 AM 01/26/2022   12:50 AM 01/25/2022    3:22 PM  CBC  WBC 4.0 - 10.5 K/uL 6.0   7.9   Hemoglobin 12.0 - 15.0 g/dL 7.4  7.2  4.9   Hematocrit 36.0 - 46.0 % 24.2  24.1  19.3   Platelets 150 - 400 K/uL 241   378     Latest Reference Range & Units 01/25/22 17:46  RBC Morphology  Dimorphism present  WBC Morphology  MORPHOLOGY UNREMARKABLE  PATHOLOGIST SMEAR REVIEW  Rpt   RBC. 3.87 - 5.11 MIL/uL 3.39 (L)  Retic Ct Pct 0.4 - 3.1 % 2.2  Retic Count, Absolute 19.0 - 186.0 K/uL 73.9  Immature Retic Fract 2.3 - 15.9 % 28.8 (H)  Tech Review  MORPHOLOGY UNREMARKABLE  (L): Data is abnormally low (H): Data is abnormally high Rpt: View report in Results Review for more information     Latest Ref Rng & Units 01/26/2022    4:19 AM 01/25/2022    3:22 PM 06/23/2021   10:12 AM  BMP  Glucose 70 - 99 mg/dL 110  218  413   BUN 8 - 23 mg/dL _0 Creatinine 0.44 - 1.00 mg/dL 0.90  1.06  0.87   BUN/Creat Ratio 12 - 28   18   Sodium 135 - 145 mmol/L 138  141  137   Potassium 3.5 - 5.1 mmol/L 3.5  3.5  4.2   Chloride 98 - 111 mmol/L 101  107  96   CO2 22 - 32 mmol/L _1 Calcium 8.9 - 10.3 mg/dL 8.9  9.2  9.0        Latest Ref Rng & Units 01/26/2022    4:19 AM 01/25/2022  5:46 PM 07/15/2020   10:27 AM  Hepatic Function  Total Protein 6.5 - 8.1 g/dL 6.4  7.3  6.8   Albumin 3.5 - 5.0 g/dL 3.6  3.8  4.2   AST 15 - 41 U/L 26  63  19   ALT 0 - 44 U/L _0 Alk Phosphatase 38 - 126 U/L 33  36  52   Total Bilirubin 0.3 - 1.2 mg/dL 2.7  1.3  0.7   Bilirubin, Direct 0.0 - 0.2 mg/dL  0.2        Latest Reference Range & Units 01/25/22 17:10  Prothrombin Time 11.4 - 15.2 seconds 14.2  INR 0.8 - 1.2  1.1    Latest Reference Range & Units 01/25/22 17:46  LDH 98 - 192 U/L 295 (H)  (H): Data is abnormally high  Component     Latest Ref Rng 01/25/2022  Haptoglobin     42 - 346 mg/dL 82     01/25/2022  DAT, complement NEG   DAT, IgG NEG.      Latest Reference Range & Units 01/25/22 17:46  Iron 28 - 170 ug/dL 9 (L)  UIBC ug/dL 551  TIBC 250 - 450 ug/dL 560 (H)  Saturation Ratios 10.4 - 31.8 % 2 (L)  (L): Data is abnormally low (H): Data is abnormally high   Latest Reference Range & Units 01/26/22 04:19  Ferritin 11 - 307 ng/mL 4 (L)  (L): Data is abnormally low   Latest Reference Range & Units 01/25/22 17:46  TSH 0.350 - 4.500 uIU/mL  0.700    Imaging:  EXAM: CT CHEST, ABDOMEN, AND PELVIS WITH CONTRAST   TECHNIQUE: Multidetector CT imaging of the chest, abdomen and pelvis was performed following the standard protocol during bolus administration of intravenous contrast.   RADIATION DOSE REDUCTION: This exam was performed according to the departmental dose-optimization program which includes automated exposure control, adjustment of the mA and/or kV according to patient size and/or use of iterative reconstruction technique.   CONTRAST:  13m OMNIPAQUE IOHEXOL 300 MG/ML  SOLN   COMPARISON:  No prior cross-sectional imaging for comparison.   FINDINGS: CT CHEST FINDINGS   Cardiovascular: Calcified and noncalcified aortic atherosclerosis is mild. Mild dilation of central pulmonary vessels not beyond 3 cm. Heart size is normal without pericardial effusion or signs of pericardial nodularity.   Mediastinum/Nodes: No thoracic inlet, axillary, mediastinal or hilar adenopathy. Esophagus grossly normal.   Lungs/Pleura: Basilar atelectasis. No effusion. No consolidative changes.   Musculoskeletal: See below for full musculoskeletal details.   CT ABDOMEN PELVIS FINDINGS   Hepatobiliary: Liver with smooth contours. Post cholecystectomy. No biliary duct dilation or focal, suspicious hepatic lesion. The portal vein is patent.   Pancreas: Normal, without mass, inflammation or ductal dilatation.   Spleen: Normal.   Adrenals/Urinary Tract: Adrenal glands are normal.   Symmetric renal enhancement without hydronephrosis. No suspicious renal lesion, perinephric or perivesical stranding. Mild cortical scarring of the RIGHT greater than LEFT kidney.   Stomach/Bowel: Generalized mural stratification of the colon compatible with moderate colitis. No signs of obstruction or pneumatosis. Appendix is normal.   Small hiatal hernia. No small bowel dilation or signs of small bowel inflammation.   Vascular/Lymphatic:    Aortic atherosclerosis. No sign of aneurysm. Smooth contour of the IVC. There is no gastrohepatic or hepatoduodenal ligament lymphadenopathy. No retroperitoneal or mesenteric lymphadenopathy.   No pelvic sidewall lymphadenopathy.   Reproductive: Post hysterectomy.  No adnexal masses.   Other:  No ascites.  No pneumoperitoneum.   Musculoskeletal: No acute bone finding. No destructive bone process. Spinal degenerative changes.   IMPRESSION: 1. Generalized mural stratification of the entire colon suggestive of moderate colitis. No signs of obstruction or pneumatosis. 2. Aortic atherosclerosis.   Aortic Atherosclerosis (ICD10-I70.0).     Electronically Signed   By: Zetta Bills M.D.   On: 01/26/2022 15:23  Assessment/Plan:  Hospital Problem List: Principal Problem:   Symptomatic anemia Active Problems:   Hypothyroidism   HYPERTENSION, BENIGN   Diabetes (HCC)  Analisia Kingsford is a 71 year old female with past medical history of type 2 diabetes, hypothyroidism, asthma, hypertension, possible history of atrial fibrillation, who presents to the ED for 4 weeks of dyspnea and chest tightness on exertion, needed for symptomatic anemia.  Symptomatic Iron Deficiency Anemia 2/2 Colitis Improved this morning post transfusion. Iron panel showing severe deficiency. Hemolysis less likely with normal haptoglobin and negative Direct Coombs. No signs of blood loss in urine. CT C/A/P unrevealing for occult malignancy but showed moderate colitis throughout the colon. -CBC tomorrow -IV iron, discharge with PO iron -Will FYI GI doctor Carol Ada and ask them to reach out for appt\ -Also will recommend OBGYN outpatient f/u   Lung lesion CT scan not able to characterize lung lesion   Hypothyroidism TSH wnl -Resume levothyroxine -TSH ordered   Hypertension -Continue home amlodipine, metoprolol, and ARB -Resume HCTZ -BMP tomorrow -> replete potassium   Type 2 diabetes A1c 2 months ago  was 6 -Restart home metformin tomorrow -CBG daily   Diet: Clear liquid diet.   Code: Full code DVT: SCD IVF: N/A  Prior to Admission Living Arrangement: Independent in personal residence Anticipated Discharge Location: home Barriers to Discharge: none anticipated, plan for discharge tomorrow  Hospital day:  LOS: 0 days  Vertell Novak, Medical Student 01/26/2022, 8:14 AM [phone]

## 2022-01-26 NOTE — TOC Initial Note (Signed)
Transition of Care [facility]) - Initial/Assessment Note    Patient Details  Name: Madison Jefferson MRN: [id number] Date of Birth: 11/29/1950  [facility]) CM/SW Contact:    Tom-Johnson, Renea Ee, RN Phone Number: 01/26/2022, 12:51 PM  Clinical Narrative:                  CM spoke with patient at bedside about needs for post hospital transition. Admitted  for 4 wks of Dyspnea and chest tightness on exertion. Found to have Symptomatic Anemia. Hgb on admission was 4.9. 2 Units PRBC given and Hgb now is 7.4. From home alone. Has three children. Independent with care and drive self prior to admission. Does not have any DME's.  PCP is Aldine Contes, MD and uses [facility] on [address].  Awaiting PT eval for disposition and needs. CM will continue to follow with needs.    Barriers to Discharge: Continued Medical Work up   Patient Goals and CMS Choice Patient states their goals for this hospitalization and ongoing recovery are:: To return home CMS Medicare.gov Compare Post Acute Care list provided to:: Patient    Expected Discharge Plan and Services     Discharge Planning Services: CM Consult   Living arrangements for the past 2 months: Apartment                                      Prior Living Arrangements/Services Living arrangements for the past 2 months: Apartment Lives with:: Self Patient language and need for interpreter reviewed:: Yes Do you feel safe going back to the place where you live?: Yes      Need for Family Participation in Patient Care: Yes (Comment)     Criminal Activity/Legal Involvement Pertinent to Current Situation/Hospitalization: No - Comment as needed  Activities of Daily Living Home Assistive Devices/Equipment: None ADL Screening (condition at time of admission) Patient's cognitive ability adequate to safely complete daily activities?: Yes Is the patient deaf or have difficulty hearing?: No Does the patient have  difficulty seeing, even when wearing glasses/contacts?: No Does the patient have difficulty concentrating, remembering, or making decisions?: No Patient able to express need for assistance with ADLs?: Yes Does the patient have difficulty dressing or bathing?: No Independently performs ADLs?: Yes (appropriate for developmental age) Does the patient have difficulty walking or climbing stairs?: No Weakness of Legs: None Weakness of Arms/Hands: None  Permission Sought/Granted Permission sought to share information with : Case Manager, Family Supports Permission granted to share information with : Yes, Verbal Permission Granted              Emotional Assessment Appearance:: Appears stated age Attitude/Demeanor/Rapport: Engaged Affect (typically observed): Accepting, Appropriate, Calm, Hopeful Orientation: : Oriented to Self, Oriented to Place, Oriented to Situation Alcohol / Substance Use: Not Applicable Psych Involvement: No (comment)  Admission diagnosis:  Symptomatic anemia [D64.9] Patient Active Problem List   Diagnosis Date Noted   Symptomatic anemia 01/25/2022   Right abducens nerve palsy 11/15/2019   Adhesive capsulitis of right shoulder 10/25/2016   Osteoarthritis of left glenohumeral joint 06/13/2016   Degenerative arthritis of thumb, left 06/01/2016   Right knee pain 07/09/2014   Diabetes ([place]) 09/20/2013   Diverticulosis    Obesity, unspecified 10/12/2012   Health care maintenance 07/03/2012   Vitamin D deficiency 12/24/2009   Fatty liver 10/29/2009   Allergic rhinitis 10/21/2009   GERD 10/21/2009  Asthma 07/23/2009   Hypothyroidism 09/05/2008   HYPERTENSION, BENIGN 09/25/2007   BACK PAIN WITH RADICULOPATHY 09/25/2007   PCP:  Aldine Contes, MD Pharmacy:   [facility] [address], [place] [address] Phone: [phone] Fax:  [phone]     Social Determinants of Health (SDOH) Interventions    Readmission Risk Interventions     No data to display

## 2022-01-26 NOTE — ED Notes (Signed)
ED TO INPATIENT HANDOFF REPORT  ED Nurse Name and Phone #: [phone] [facility] RN  S Name/Age/Gender Madison Jefferson 71 y.o. female Room/Bed: 043C/043C  Code Status   Code Status: Full Code  Home/SNF/Other Home Patient oriented to: self, place, time, and situation Is this baseline? Yes   Triage Complete: Triage complete  Chief Complaint Symptomatic anemia [D64.9]  Triage Note Pt arrives from [facility] triage for eval of chest pain, sob, and lightheadedness with exertion x several weeks.    Allergies Allergies  Allergen Reactions   Oysters [Shellfish Allergy] Rash    Level of Care/Admitting Diagnosis ED Disposition     ED Disposition  Admit   Condition  --   Comment  Hospital Area: [place] [[id number]]  Level of Care: Telemetry Medical [104]  May place patient in observation at [facility] or [place] if equivalent level of care is available:: No  Covid Evaluation: Asymptomatic - no recent exposure (last 10 days) testing not required  Diagnosis: Symptomatic anemia [3382505]  Admitting Physician: Velna Ochs [[id number]]  Attending Physician: Velna Ochs [[id number]]          B Medical/Surgery History Past Medical History:  Diagnosis Date   Allergy    Arthritis    Asthma    Atrial fibrillation ([place])    h/o of isolated AF associated with hypothyroidism    Diverticulosis    cecum   Diverticulosis    colonoscopy 2002 [facility]   Dysphagia 06/23/2021   Fatty liver    GERD (gastroesophageal reflux disease)    Hypertension    Lumbar radiculopathy    Thyroid disease    hypothyroid   TIA (transient ischemic attack)    2006   Urinary incontinence    mixed urge and stress    Vitamin D deficiency    Past Surgical History:  Procedure Laterality Date   ABLATION     thyroid with radioactive iodine ablation   CARPAL TUNNEL RELEASE     left wrist   CHOLECYSTECTOMY     LEFT HEART CATHETERIZATION WITH CORONARY ANGIOGRAM N/A 01/28/2012    Procedure: LEFT HEART CATHETERIZATION WITH CORONARY ANGIOGRAM;  Surgeon: Birdie Riddle, MD;  Location: [facility];  Service: Cardiovascular;  Laterality: N/A;   OTHER SURGICAL HISTORY     carpel tunnel surgery    OTHER SURGICAL HISTORY     carpel tunnel repair   RIGHT HEART CATH  2005-Dr. Doylene Canard   minimal CAD; left circumflex dominant with 10-15% proximal lesion;; normal LVF   TUBAL LIGATION     VAGINAL HYSTERECTOMY     partial hysterectomy 1983 vaginal bleeding     A IV Location/Drains/Wounds Patient Lines/Drains/Airways Status     Active Line/Drains/Airways     Name Placement date Placement time Site Days   Peripheral IV 01/25/22 20 G Right Antecubital 01/25/22  1725  Antecubital  1            Intake/Output Last 24 hours  Intake/Output Summary (Last 24 hours) at 01/26/2022 0237 Last data filed at 01/26/2022 0048 Gross per 24 hour  Intake 1469.83 ml  Output --  Net 1469.83 ml    Labs/Imaging Results for orders placed or performed during the hospital encounter of 01/25/22 (from the past 48 hour(s))  Basic metabolic panel     Status: Abnormal   Collection Time: 01/25/22  3:22 PM  Result Value Ref Range   Sodium 141 135 - 145 mmol/L   Potassium 3.5 3.5 - 5.1 mmol/L  Chloride 107 98 - 111 mmol/L   CO2 24 22 - 32 mmol/L   Glucose, Bld 218 (H) 70 - 99 mg/dL    Comment: Glucose reference range applies only to samples taken after fasting for at least 8 hours.   BUN 14 8 - 23 mg/dL   Creatinine, Ser 1.06 (H) 0.44 - 1.00 mg/dL   Calcium 9.2 8.9 - 10.3 mg/dL   GFR, Estimated 55 (L) >60 mL/min    Comment: (NOTE) Calculated using the CKD-EPI Creatinine Equation (2021)    Anion gap 10 5 - 15    Comment: Performed at [facility] [address], [place], [place] [address]  CBC     Status: Abnormal   Collection Time: 01/25/22  3:22 PM  Result Value Ref Range   WBC 7.9 4.0 - 10.5 K/uL   RBC 3.36 (L) 3.87 - 5.11 MIL/uL   Hemoglobin 4.9 (LL) 12.0 - 15.0 g/dL     Comment: REPEATED TO VERIFY Reticulocyte Hemoglobin testing may be clinically indicated, consider ordering this additional test [id number] THIS CRITICAL RESULT HAS VERIFIED AND BEEN CALLED TO BERTRAND S, RN BY DAVID APPIAH ON 06 12 2023 AT 1643, AND HAS BEEN READ BACK.     HCT 19.3 (L) 36.0 - 46.0 %   MCV 57.4 (L) 80.0 - 100.0 fL   MCH 14.6 (L) 26.0 - 34.0 pg   MCHC 25.4 (L) 30.0 - 36.0 g/dL   RDW 25.4 (H) 11.5 - 15.5 %   Platelets 378 150 - 400 K/uL   nRBC 0.0 0.0 - 0.2 %    Comment: Performed at [facility] [address], [place], [place] [address]  Troponin I (High Sensitivity)     Status: None   Collection Time: 01/25/22  3:22 PM  Result Value Ref Range   Troponin I (High Sensitivity) 4 <18 ng/L    Comment: (NOTE) Elevated high sensitivity troponin I (hsTnI) values and significant  changes across serial measurements may suggest ACS but many other  chronic and acute conditions are known to elevate hsTnI results.  Refer to the "Links" section for chest pain algorithms and additional  guidance. Performed at [facility], [place] [address], [place], [place] [address]   Troponin I (High Sensitivity)     Status: None   Collection Time: 01/25/22  5:10 PM  Result Value Ref Range   Troponin I (High Sensitivity) 5 <18 ng/L    Comment: (NOTE) Elevated high sensitivity troponin I (hsTnI) values and significant  changes across serial measurements may suggest ACS but many other  chronic and acute conditions are known to elevate hsTnI results.  Refer to the "Links" section for chest pain algorithms and additional  guidance. Performed at [facility], [place] [address], [place], [place] [address]   Protime-INR     Status: None   Collection Time: 01/25/22  5:10 PM  Result Value Ref Range   Prothrombin Time 14.2 11.4 - 15.2 seconds   INR 1.1 0.8 - 1.2    Comment: (NOTE) INR goal varies based on device and disease states. Performed at [facility], [place] [address], [place], [place] [address]   Type and screen [place]     Status: None (Preliminary result)   Collection Time: 01/25/22  5:10 PM  Result Value Ref Range   ABO/RH(D) A POS    Antibody Screen NEG    Sample Expiration 01/28/2022,2359    Unit Number  [id number]    Blood Component Type RED CELLS,LR    Unit division 00    Status of Unit ISSUED    Transfusion Status OK TO TRANSFUSE    Crossmatch Result      Compatible Performed at [facility], [place] [address], [place], [place] [address]    Unit Number [id number]    Blood Component Type RED CELLS,LR    Unit division 00    Status of Unit ISSUED    Transfusion Status OK TO TRANSFUSE    Crossmatch Result Compatible   POC occult blood, ED     Status: None   Collection Time: 01/25/22  5:19 PM  Result Value Ref Range   Fecal Occult Bld NEGATIVE NEGATIVE  Prepare RBC (crossmatch)     Status: None   Collection Time: 01/25/22  5:25 PM  Result Value Ref Range   Order Confirmation      ORDER PROCESSED BY BLOOD BANK Performed at [facility], [place] [address] [place], [place] [address]   ABO/Rh     Status: None   Collection Time: 01/25/22  5:30 PM  Result Value Ref Range   ABO/RH(D)      A POS Performed at [facility] [address], [place], [place] [address]   Direct antiglobulin test (not at [facility])     Status: None   Collection Time: 01/25/22  5:30 PM  Result Value Ref Range   DAT, complement NEG    DAT, IgG      NEG Performed at [facility] [address] [place], [place] [address]   Hepatic function panel     Status: Abnormal   Collection Time: 01/25/22  5:46 PM  Result Value Ref Range   Total Protein 7.3 6.5 - 8.1 g/dL   Albumin 3.8 3.5 - 5.0 g/dL   AST 63 (H) 15 - 41 U/L   ALT 29 0 - 44 U/L   Alkaline Phosphatase 36 (L) 38 - 126 U/L   Total Bilirubin 1.3 (H) 0.3 - 1.2 mg/dL   Bilirubin, Direct 0.2 0.0 - 0.2 mg/dL   Indirect Bilirubin 1.1 (H) 0.3 - 0.9 mg/dL    Comment:  Performed at [facility] [address], [place], [place] [address]  Lactate dehydrogenase     Status: Abnormal   Collection Time: 01/25/22  5:46 PM  Result Value Ref Range   LDH 295 (H) 98 - 192 U/L    Comment: Performed at [facility], [place] [address] [place], [place] [address]  Iron and TIBC     Status: Abnormal   Collection Time: 01/25/22  5:46 PM  Result Value Ref Range   Iron 9 (L) 28 - 170 ug/dL   TIBC 560 (H) 250 - 450 ug/dL   Saturation Ratios 2 (L) 10.4 - 31.8 %   UIBC 551 ug/dL    Comment: Performed at [facility], [place] [address] [place], [place] [address]  Reticulocytes     Status: Abnormal   Collection Time: 01/25/22  5:46 PM  Result Value Ref Range   Retic Ct Pct 2.2 0.4 - 3.1 %   RBC. 3.39 (L) 3.87 - 5.11 MIL/uL   Retic Count, Absolute 73.9 19.0 - 186.0 K/uL   Immature Retic Fract 28.8 (H) 2.3 - 15.9 %    Comment: Performed at [facility] [address] [place], [place] [address]  Vitamin B12     Status:  None   Collection Time: 01/25/22  5:46 PM  Result Value Ref Range   Vitamin B-12 290 180 - 914 pg/mL    Comment: (NOTE) This assay is not validated for testing neonatal or myeloproliferative syndrome specimens for Vitamin B12 levels. Performed at [facility], [place] [address], [place], [place] [address]   TSH     Status: None   Collection Time: 01/25/22  5:46 PM  Result Value Ref Range   TSH 0.700 0.350 - 4.500 uIU/mL    Comment: Performed by a 3rd Generation assay with a functional sensitivity of <=0.01 uIU/mL. Performed at [facility], [place] [address], [place], [place] [address]   APTT     Status: Abnormal   Collection Time: 01/25/22  5:46 PM  Result Value Ref Range   aPTT 23 (L) 24 - 36 seconds    Comment: Performed at [facility] [address] [place], [place] [address]  Technologist smear review     Status: None   Collection Time: 01/25/22  5:46 PM  Result Value Ref Range   WBC MORPHOLOGY  MORPHOLOGY UNREMARKABLE    RBC MORPHOLOGY Dimorphism present    Tech Review MORPHOLOGY UNREMARKABLE     Comment: Performed at [facility] [address], [place], [place] [address]  Urinalysis, Routine w reflex microscopic Urine, Clean Catch     Status: None   Collection Time: 01/26/22 12:50 AM  Result Value Ref Range   Color, Urine YELLOW YELLOW   APPearance CLEAR CLEAR   Specific Gravity, Urine 1.011 1.005 - 1.030   pH 5.0 5.0 - 8.0   Glucose, UA NEGATIVE NEGATIVE mg/dL   Hgb urine dipstick NEGATIVE NEGATIVE   Bilirubin Urine NEGATIVE NEGATIVE   Ketones, ur NEGATIVE NEGATIVE mg/dL   Protein, ur NEGATIVE NEGATIVE mg/dL   Nitrite NEGATIVE NEGATIVE   Leukocytes,Ua NEGATIVE NEGATIVE    Comment: Performed at [facility] [address], [place], [place] [address]  Hemoglobin and hematocrit, blood     Status: Abnormal   Collection Time: 01/26/22 12:50 AM  Result Value Ref Range   Hemoglobin 7.2 (L) 12.0 - 15.0 g/dL    Comment: REPEATED TO VERIFY POST TRANSFUSION SPECIMEN    HCT 24.1 (L) 36.0 - 46.0 %    Comment: Performed at [facility] [address], [place], [place] [address]   DG Chest 2 View  Result Date: 01/25/2022 CLINICAL DATA:  Chest pain EXAM: CHEST - 2 VIEW COMPARISON:  08/11/2015 FINDINGS: Cardiac size is within normal limits. There are no signs of pulmonary edema or focal pulmonary consolidation. In the PA view, there is an ill-defined 1 cm faint density in the lateral aspect of right apex overlying the anterior right second rib. Rest of the lung fields are unremarkable. There is no pleural effusion or pneumothorax. IMPRESSION: There are no signs of pulmonary edema or focal pulmonary consolidation. There is an ill-defined 1 cm faint opacity in the lateral aspect of right upper lung fields which may suggest an artifact or pleural plaque or parenchymal nodule. Follow-up chest radiographs along with CT if warranted should be considered. Electronically  Signed   By: Elmer Picker M.D.   On: 01/25/2022 16:07    Pending Labs Unresulted Labs (From admission, onward)     Start     Ordered   01/26/22 0500  CBC  Tomorrow morning,   R        01/25/22 1840  01/26/22 0500  Comprehensive metabolic panel  Tomorrow morning,   R        01/25/22 1840   01/26/22 0500  Protime-INR  Tomorrow morning,   R        01/25/22 1840   01/25/22 2255  Ferritin  Add-on,   AD        01/25/22 2254   01/25/22 1845  Haptoglobin  Add-on,   AD        01/25/22 1844   01/25/22 1746  Pathologist smear review  Once,   URGENT        01/25/22 1746            Vitals/Pain Today's Vitals   01/25/22 2201 01/25/22 2213 01/26/22 0047 01/26/22 0100  BP:  (!) 143/57 (!) 151/62 (!) 159/66  Pulse:  96 82 91  Resp:  '17 18 14  '$ Temp:  98.7 F (37.1 C) 98.5 F (36.9 C)   TempSrc:  Oral Oral   SpO2:  100% 100% 96%  Weight: 74.8 kg     Height: 5' (1.524 m)     PainSc:        Isolation Precautions No active isolations  Medications Medications  acetaminophen (TYLENOL) tablet 650 mg (has no administration in time range)    Or  acetaminophen (TYLENOL) suppository 650 mg (has no administration in time range)  levothyroxine (SYNTHROID) tablet 100 mcg (has no administration in time range)  metoprolol succinate (TOPROL-XL) 24 hr tablet 100 mg (has no administration in time range)  amLODipine (NORVASC) tablet 10 mg (has no administration in time range)  irbesartan (AVAPRO) tablet 150 mg (has no administration in time range)  0.9 %  sodium chloride infusion (0 mL/hr Intravenous Stopped 01/26/22 0048)    Mobility walks Low fall risk   Focused Assessments Pulmonary Assessment Handoff:  Lung sounds:   O2 Device: Room Air      R Recommendations: See Admitting Provider Note  Report given to:   Additional Notes:

## 2022-01-27 DIAGNOSIS — D649 Anemia, unspecified: Secondary | ICD-10-CM | POA: Diagnosis not present

## 2022-01-27 LAB — COMPREHENSIVE METABOLIC PANEL WITH GFR
ALT: 28 U/L (ref 0–44)
AST: 35 U/L (ref 15–41)
Albumin: 3.4 g/dL — ABNORMAL LOW (ref 3.5–5.0)
Alkaline Phosphatase: 34 U/L — ABNORMAL LOW (ref 38–126)
Anion gap: 10 (ref 5–15)
BUN: 6 mg/dL — ABNORMAL LOW (ref 8–23)
CO2: 23 mmol/L (ref 22–32)
Calcium: 8.7 mg/dL — ABNORMAL LOW (ref 8.9–10.3)
Chloride: 104 mmol/L (ref 98–111)
Creatinine, Ser: 0.89 mg/dL (ref 0.44–1.00)
GFR, Estimated: >60 mL/min
Glucose, Bld: 106 mg/dL — ABNORMAL HIGH (ref 70–99)
Potassium: 3.6 mmol/L (ref 3.5–5.1)
Sodium: 137 mmol/L (ref 135–145)
Total Bilirubin: 2 mg/dL — ABNORMAL HIGH (ref 0.3–1.2)
Total Protein: 6.1 g/dL — ABNORMAL LOW (ref 6.5–8.1)

## 2022-01-27 LAB — HEMOGLOBIN AND HEMATOCRIT, BLOOD
HCT: 29.7 % — ABNORMAL LOW (ref 36.0–46.0)
Hemoglobin: 9.3 g/dL — ABNORMAL LOW (ref 12.0–15.0)

## 2022-01-27 LAB — CBC
HCT: 24.3 % — ABNORMAL LOW (ref 36.0–46.0)
Hemoglobin: 7.1 g/dL — ABNORMAL LOW (ref 12.0–15.0)
MCH: 18.7 pg — ABNORMAL LOW (ref 26.0–34.0)
MCHC: 29.2 g/dL — ABNORMAL LOW (ref 30.0–36.0)
MCV: 63.9 fL — ABNORMAL LOW (ref 80.0–100.0)
Platelets: 186 10*3/uL (ref 150–400)
RBC: 3.8 MIL/uL — ABNORMAL LOW (ref 3.87–5.11)
RDW: 32.7 % — ABNORMAL HIGH (ref 11.5–15.5)
WBC: 6.1 10*3/uL (ref 4.0–10.5)
nRBC: 0 % (ref 0.0–0.2)

## 2022-01-27 LAB — C DIFFICILE QUICK SCREEN W PCR REFLEX
C Diff antigen: NEGATIVE
C Diff interpretation: NOT DETECTED
C Diff toxin: NEGATIVE

## 2022-01-27 LAB — PREPARE RBC (CROSSMATCH)

## 2022-01-27 LAB — PATHOLOGIST SMEAR REVIEW

## 2022-01-27 MED ORDER — PEG 3350-KCL-NA BICARB-NACL 420 G PO SOLR
4000.0000 mL | Freq: Once | ORAL | Status: AC
Start: 2022-01-27 — End: 2022-01-27
  Administered 2022-01-27: 4000 mL via ORAL
  Filled 2022-01-27: qty 4000

## 2022-01-27 MED ORDER — METFORMIN HCL 500 MG PO TABS
500.0000 mg | ORAL_TABLET | Freq: Every day | ORAL | Status: DC
  Administered 2022-01-30: 500 mg via ORAL
  Filled 2022-01-27: qty 1

## 2022-01-27 MED ORDER — POTASSIUM CHLORIDE 20 MEQ PO PACK
40.0000 meq | PACK | Freq: Once | ORAL | Status: AC
  Administered 2022-01-27: 40 meq via ORAL
  Filled 2022-01-27: qty 2

## 2022-01-27 MED ORDER — SODIUM CHLORIDE 0.9% IV SOLUTION: Freq: Once | INTRAVENOUS | Status: DC

## 2022-01-27 NOTE — Evaluation (Signed)
Physical Therapy Evaluation & Discharge Patient Details Name: Madison Jefferson MRN: [id number] DOB: 01-11-1951 Today's Date: 01/27/2022  History of Present Illness  71 y/o female presented to ED on 01/25/22 for 4 week hx of dyspnea and chest tightness on exertion. Admitted for symptomatic anemia (Hgb 4.9). PMH: T2Dm, HTN, hx of Afib  Clinical Impression  Patient admitted with the above. Patient functioning at independent level with no AD for all mobility including stair negotiation. Patient complains of decreased activity tolerance. Encouraged continued mobility in hallway to improve endurance. Patient lives alone in a 2 level town home and has family that can assist as needed. No further skilled PT needs identified acutely. No PT follow up recommended at this time. PT will sign off.        Recommendations for follow up therapy are one component of a multi-disciplinary discharge planning process, led by the attending physician.  Recommendations may be updated based on patient status, additional functional criteria and insurance authorization.  Follow Up Recommendations No PT follow up    Assistance Recommended at Discharge None  Patient can return home with the following       Equipment Recommendations None recommended by PT  Recommendations for Other Services       Functional Status Assessment Patient has not had a recent decline in their functional status     Precautions / Restrictions Precautions Precautions: None Restrictions Weight Bearing Restrictions: No      Mobility  Bed Mobility Overal bed mobility: Independent                  Transfers Overall transfer level: Independent Equipment used: None                    Ambulation/Gait Ambulation/Gait assistance: Independent Gait Distance (Feet): 400 Feet Assistive device: None Gait Pattern/deviations: Drifts right/left   Gait velocity interpretation: >4.37 ft/sec, indicative of normal walking speed       Stairs Stairs: Yes Stairs assistance: Independent Stair Management: One rail Right, Alternating pattern, Forwards Number of Stairs: 10    Wheelchair Mobility    Modified Rankin (Stroke Patients Only)       Balance Overall balance assessment: Mild deficits observed, not formally tested                                           Pertinent Vitals/Pain Pain Assessment Pain Assessment: No/denies pain    Home Living Family/patient expects to be discharged to:: Private residence Living Arrangements: Alone Available Help at Discharge: Family Type of Home: House Home Access: Stairs to enter   Technical brewer of Steps: 3 Alternate Level Stairs-Number of Steps: flight Home Layout: Two level Home Equipment: None      Prior Function Prior Level of Function : Independent/Modified Independent;Driving                     Hand Dominance        Extremity/Trunk Assessment   Upper Extremity Assessment Upper Extremity Assessment: Overall WFL for tasks assessed    Lower Extremity Assessment Lower Extremity Assessment: Generalized weakness    Cervical / Trunk Assessment Cervical / Trunk Assessment: Normal  Communication   Communication: No difficulties  Cognition Arousal/Alertness: Awake/alert Behavior During Therapy: WFL for tasks assessed/performed Overall Cognitive Status: Within Functional Limits for tasks assessed  General Comments      Exercises     Assessment/Plan    PT Assessment Patient does not need any further PT services  PT Problem List         PT Treatment Interventions      PT Goals (Current goals can be found in the Care Plan section)  Acute Rehab PT Goals Patient Stated Goal: to go home PT Goal Formulation: All assessment and education complete, DC therapy    Frequency       Co-evaluation               AM-PAC PT "6 Clicks" Mobility   Outcome Measure Help needed turning from your back to your side while in a flat bed without using bedrails?: None Help needed moving from lying on your back to sitting on the side of a flat bed without using bedrails?: None Help needed moving to and from a bed to a chair (including a wheelchair)?: None Help needed standing up from a chair using your arms (e.g., wheelchair or bedside chair)?: None Help needed to walk in hospital room?: None Help needed climbing 3-5 steps with a railing? : None 6 Click Score: 24    End of Session   Activity Tolerance: Patient tolerated treatment well Patient left: in chair Nurse Communication: Mobility status PT Visit Diagnosis: Muscle weakness (generalized) (M62.81)    Time: 5956-3875 PT Time Calculation (min) (ACUTE ONLY): 13 min   Charges:   PT Evaluation $PT Eval Low Complexity: 1 Low          Montrey Buist A. Gilford Rile PT, DPT Acute Rehabilitation Services Office [phone]   Linna Hoff 01/27/2022, 8:50 AM

## 2022-01-27 NOTE — Progress Notes (Signed)
Patient Summary Nyja Westbrook is a 71 year old female with past medical history of type 2 diabetes, hypothyroidism, asthma, hypertension, possible history of atrial fibrillation, who presents to the ED for 4 weeks of dyspnea and chest tightness on exertion, needed for symptomatic anemia.  Subjective:  No acute events overnight.  On interview this morning, she has no complaints. She says her fatigue is improved. Her headache is gone from yesterday. She has been having frequent bowel movements today from the bowel prep. No chest pain, dyspnea, belly pain.   Objective:  Vital signs in last 24 hours: Vitals:   01/26/22 0922 01/26/22 1746 01/26/22 2142 01/27/22 0508  BP: (!) 155/65 (!) 149/67 (!) 139/94 128/60  Pulse: 90 83 76 70  Resp: '16 17 18 18  '$ Temp: 98 F (36.7 C) 98.1 F (36.7 C) 98.4 F (36.9 C) 98.3 F (36.8 C)  TempSrc:   Oral   SpO2: 99% 98% 100% 100%  Weight:      Height:       Weight change:   Intake/Output Summary (Last 24 hours) at 01/27/2022 0646 Last data filed at 01/27/2022 0200 Gross per 24 hour  Intake 1890.04 ml  Output 0 ml  Net 1890.04 ml   Physical Exam GEN: in no acute distress HEENT: pupils symmetric, EOMI, moist mucus membranes, conjunctival pallor [id number] warm and well perfused.  PULM: normal work of breathing ABD: soft, non-tender NEURO: no gross motor deficits  Labs:    Latest Ref Rng & Units 01/27/2022    2:16 AM 01/26/2022    4:19 AM 01/26/2022   12:50 AM  CBC  WBC 4.0 - 10.5 K/uL 6.1  6.0    Hemoglobin 12.0 - 15.0 g/dL 7.1  7.4  7.2   Hematocrit 36.0 - 46.0 % 24.3  24.2  24.1   Platelets 150 - 400 K/uL 186  241         Latest Ref Rng & Units 01/27/2022    2:16 AM 01/26/2022    8:22 AM 01/26/2022    4:19 AM  CMP  Glucose 70 - 99 mg/dL 106   110   BUN 8 - 23 mg/dL 6   12   Creatinine 0.44 - 1.00 mg/dL 0.89   0.90   Sodium 135 - 145 mmol/L 137   138   Potassium 3.5 - 5.1 mmol/L 3.6   3.5   Chloride 98 - 111 mmol/L 104   101    CO2 22 - 32 mmol/L 23   22   Calcium 8.9 - 10.3 mg/dL 8.7   8.9   Total Protein 6.5 - 8.1 g/dL 6.1   6.4   Total Bilirubin 0.3 - 1.2 mg/dL 2.0  2.5  2.7   Alkaline Phos 38 - 126 U/L 34   33   AST 15 - 41 U/L 35   26   ALT 0 - 44 U/L 28   25    Imaging:  No new imaging  Assessment/Plan:  Hospital Problem List: Principal Problem:   Symptomatic anemia Active Problems:   Hypothyroidism   HYPERTENSION, BENIGN   Diabetes (HCC)  Jeniece Hannis is a 71 year old female with past medical history of type 2 diabetes, hypothyroidism, asthma, hypertension, possible history of atrial fibrillation, who presents to the ED for 4 weeks of dyspnea and chest tightness on exertion, admission needed for symptomatic anemia.   Symptomatic Iron Deficiency Anemia 2/2 Colitis Hemoglobin at transfusion borderline this morning. GI consulted, suspect colitis may be causing bleeding. -Transfuse 1U pRBCs,  check post H&H -Continue IV iron tomorrow after blood, '750mg'$  repleted so far -Will FYI GI doctor Carol Ada and ask them to reach out for appt -Also will recommend OBGYN outpatient f/u -Home aspirin held until discharge   Lung lesion CT scan not able to characterize lung lesion   Hypothyroidism TSH wnl -Continue home levothyroxine  Hypertension -Continue home amlodipine, metoprolol, ARB, HCTZ -BMP tomorrow -> replete potassium today   Type 2 diabetes A1c 2 months ago was 6 -Restart home metformin today -CBG daily   Diet: Clear liquid diet, NPO at midnight Code: Full code DVT: SCD IVF: N/A  Prior to Admission Living Arrangement: Independent in personal residence Anticipated Discharge Location: home Barriers to Discharge: none anticipated, plan for discharge Friday after [facility] day:  LOS: 1 day  Vertell Novak, Medical Student 01/27/2022, 6:46 AM [phone]

## 2022-01-27 NOTE — H&P (View-Only) (Signed)
Reason for Consult: Symptomatic anemia Referring Physician: Teaching Service  Madison Jefferson HPI: This is a 71 year old female with a PMH of HTN, TIA, afib, DM, and asthma admitted to the hospital for symptomatic anemia.  The patient states that she started to notice fatigue and weakness starting back in December after her eye surgery.  Over the months the patient stated to experience more fatigue.  Last week she was encouraged by her sister to seek medical attention sooner as ambulating from her living room to her kitchen caused DOE, tachycardia, and chest tightness.  She was scheduled to see her PCP, but she presented to the ER on 01/25/2022.  Blood work showed that her admission HGB was 4.9 g/dL with an MCV of 57.4.  Imaging of the abdomen showed a colitis, but no masses.  Her colonoscopy in 2016 was essentially normal.  An inflammatory polyp was removed at that time and she was recommended a 10 year follow up.  She only started to experience some mild diarrhea recently.  Prior to this time she denies any issues with abdominal pain, nausea, vomiting, hematemesis, hematochezia, or melena.  During this hospitalization she did see some minor blood on the toilet tissue.  Past Medical History:  Diagnosis Date   Allergy    Arthritis    Asthma    Atrial fibrillation ([place])    h/o of isolated AF associated with hypothyroidism    Diverticulosis    cecum   Diverticulosis    colonoscopy 2002 [facility]   Dysphagia 06/23/2021   Fatty liver    GERD (gastroesophageal reflux disease)    Hypertension    Lumbar radiculopathy    Thyroid disease    hypothyroid   TIA (transient ischemic attack)    2006   Urinary incontinence    mixed urge and stress    Vitamin D deficiency     Past Surgical History:  Procedure Laterality Date   ABLATION     thyroid with radioactive iodine ablation   CARPAL TUNNEL RELEASE     left wrist   CHOLECYSTECTOMY     LEFT HEART CATHETERIZATION WITH CORONARY ANGIOGRAM N/A  01/28/2012   Procedure: LEFT HEART CATHETERIZATION WITH CORONARY ANGIOGRAM;  Surgeon: Birdie Riddle, MD;  Location: [facility];  Service: Cardiovascular;  Laterality: N/A;   OTHER SURGICAL HISTORY     carpel tunnel surgery    OTHER SURGICAL HISTORY     carpel tunnel repair   RIGHT HEART CATH  2005-Dr. Doylene Canard   minimal CAD; left circumflex dominant with 10-15% proximal lesion;; normal LVF   TUBAL LIGATION     VAGINAL HYSTERECTOMY     partial hysterectomy 1983 vaginal bleeding    Family History  Problem Relation Age of Onset   Diabetes Mother    Stroke Mother    Cancer Mother        mother <50 at diagnosis (breast); h/o oral cancer   Diabetes Father    Heart disease Father    Stroke Father    Hypertension Father    Diabetes Brother    Colon cancer Brother    Diabetes Sister    Diabetes Paternal Uncle    Cancer Brother        lung   Cancer Brother        throat   Esophageal cancer Neg Hx    Rectal cancer Neg Hx    Stomach cancer Neg Hx     Social History:  reports that she has  never smoked. She has never used smokeless tobacco. She reports that she does not drink alcohol and does not use drugs.  Allergies:  Allergies  Allergen Reactions   Oysters [Shellfish Allergy] Rash    Medications: Scheduled:  amLODipine  10 mg Oral Daily   hydrochlorothiazide  25 mg Oral Daily   irbesartan  150 mg Oral Daily   levothyroxine  100 mcg Oral QAC breakfast   metoprolol succinate  100 mg Oral Daily   Continuous:  Results for orders placed or performed during the hospital encounter of 01/25/22 (from the past 24 hour(s))  Glucose, capillary     Status: Abnormal   Collection Time: 01/26/22  8:00 AM  Result Value Ref Range   Glucose-Capillary 155 (H) 70 - 99 mg/dL  Bilirubin, fractionated(tot/dir/indir)     Status: Abnormal   Collection Time: 01/26/22  8:22 AM  Result Value Ref Range   Total Bilirubin 2.5 (H) 0.3 - 1.2 mg/dL   Bilirubin, Direct 0.2 0.0 - 0.2 mg/dL   Indirect  Bilirubin 2.3 (H) 0.3 - 0.9 mg/dL  CBC     Status: Abnormal   Collection Time: 01/27/22  2:16 AM  Result Value Ref Range   WBC 6.1 4.0 - 10.5 K/uL   RBC 3.80 (L) 3.87 - 5.11 MIL/uL   Hemoglobin 7.1 (L) 12.0 - 15.0 g/dL   HCT 24.3 (L) 36.0 - 46.0 %   MCV 63.9 (L) 80.0 - 100.0 fL   MCH 18.7 (L) 26.0 - 34.0 pg   MCHC 29.2 (L) 30.0 - 36.0 g/dL   RDW 32.7 (H) 11.5 - 15.5 %   Platelets 186 150 - 400 K/uL   nRBC 0.0 0.0 - 0.2 %  Comprehensive metabolic panel     Status: Abnormal   Collection Time: 01/27/22  2:16 AM  Result Value Ref Range   Sodium 137 135 - 145 mmol/L   Potassium 3.6 3.5 - 5.1 mmol/L   Chloride 104 98 - 111 mmol/L   CO2 23 22 - 32 mmol/L   Glucose, Bld 106 (H) 70 - 99 mg/dL   BUN 6 (L) 8 - 23 mg/dL   Creatinine, Ser 0.89 0.44 - 1.00 mg/dL   Calcium 8.7 (L) 8.9 - 10.3 mg/dL   Total Protein 6.1 (L) 6.5 - 8.1 g/dL   Albumin 3.4 (L) 3.5 - 5.0 g/dL   AST 35 15 - 41 U/L   ALT 28 0 - 44 U/L   Alkaline Phosphatase 34 (L) 38 - 126 U/L   Total Bilirubin 2.0 (H) 0.3 - 1.2 mg/dL   GFR, Estimated >60 >60 mL/min   Anion gap 10 5 - 15     CT CHEST ABDOMEN PELVIS W CONTRAST  Result Date: 01/26/2022 CLINICAL DATA:  Chest pain and unintended weight loss in a 71 year old female. EXAM: CT CHEST, ABDOMEN, AND PELVIS WITH CONTRAST TECHNIQUE: Multidetector CT imaging of the chest, abdomen and pelvis was performed following the standard protocol during bolus administration of intravenous contrast. RADIATION DOSE REDUCTION: This exam was performed according to the departmental dose-optimization program which includes automated exposure control, adjustment of the mA and/or kV according to patient size and/or use of iterative reconstruction technique. CONTRAST:  171m OMNIPAQUE IOHEXOL 300 MG/ML  SOLN COMPARISON:  No prior cross-sectional imaging for comparison. FINDINGS: CT CHEST FINDINGS Cardiovascular: Calcified and noncalcified aortic atherosclerosis is mild. Mild dilation of central  pulmonary vessels not beyond 3 cm. Heart size is normal without pericardial effusion or signs of pericardial nodularity. Mediastinum/Nodes: No thoracic  inlet, axillary, mediastinal or hilar adenopathy. Esophagus grossly normal. Lungs/Pleura: Basilar atelectasis. No effusion. No consolidative changes. Musculoskeletal: See below for full musculoskeletal details. CT ABDOMEN PELVIS FINDINGS Hepatobiliary: Liver with smooth contours. Post cholecystectomy. No biliary duct dilation or focal, suspicious hepatic lesion. The portal vein is patent. Pancreas: Normal, without mass, inflammation or ductal dilatation. Spleen: Normal. Adrenals/Urinary Tract: Adrenal glands are normal. Symmetric renal enhancement without hydronephrosis. No suspicious renal lesion, perinephric or perivesical stranding. Mild cortical scarring of the RIGHT greater than LEFT kidney. Stomach/Bowel: Generalized mural stratification of the colon compatible with moderate colitis. No signs of obstruction or pneumatosis. Appendix is normal. Small hiatal hernia. No small bowel dilation or signs of small bowel inflammation. Vascular/Lymphatic: Aortic atherosclerosis. No sign of aneurysm. Smooth contour of the IVC. There is no gastrohepatic or hepatoduodenal ligament lymphadenopathy. No retroperitoneal or mesenteric lymphadenopathy. No pelvic sidewall lymphadenopathy. Reproductive: Post hysterectomy.  No adnexal masses. Other: No ascites.  No pneumoperitoneum. Musculoskeletal: No acute bone finding. No destructive bone process. Spinal degenerative changes. IMPRESSION: 1. Generalized mural stratification of the entire colon suggestive of moderate colitis. No signs of obstruction or pneumatosis. 2. Aortic atherosclerosis. Aortic Atherosclerosis (ICD10-I70.0). Electronically Signed   By: Zetta Bills M.D.   On: 01/26/2022 15:23   DG Chest 2 View  Result Date: 01/25/2022 CLINICAL DATA:  Chest pain EXAM: CHEST - 2 VIEW COMPARISON:  08/11/2015 FINDINGS: Cardiac  size is within normal limits. There are no signs of pulmonary edema or focal pulmonary consolidation. In the PA view, there is an ill-defined 1 cm faint density in the lateral aspect of right apex overlying the anterior right second rib. Rest of the lung fields are unremarkable. There is no pleural effusion or pneumothorax. IMPRESSION: There are no signs of pulmonary edema or focal pulmonary consolidation. There is an ill-defined 1 cm faint opacity in the lateral aspect of right upper lung fields which may suggest an artifact or pleural plaque or parenchymal nodule. Follow-up chest radiographs along with CT if warranted should be considered. Electronically Signed   By: Elmer Picker M.D.   On: 01/25/2022 16:07    ROS:  As stated above in the HPI otherwise negative.  Blood pressure 128/60, pulse 70, temperature 98.3 F (36.8 C), resp. rate 18, height 5' (1.524 m), weight 74.8 kg, SpO2 100 %.    PE: Gen: NAD, Alert and Oriented HEENT:  [place]/AT, EOMI Neck: Supple, no LAD Lungs: CTA Bilaterally CV: RRR without M/G/R ABD: Soft, NTND, +BS Ext: No C/C/E  Assessment/Plan: 1) Symptomatic anemia. 2) Colitis on the CT scan. 3) Mild hematochezia. 4) Mild diarrhea.   With the imaging findings further evaluation with an EGD/colonoscopy will be performed.  The intent is to perform the procedure tomorrow pending scheduling.  Plan: 1) EGD/colonoscopy tomorrow.  Maanasa Aderhold D 01/27/2022, 7:32 AM

## 2022-01-27 NOTE — Consult Note (Signed)
Reason for Consult: Symptomatic anemia Referring Physician: Teaching Service  Madison Jefferson HPI: This is a 71 year old female with a PMH of HTN, TIA, afib, DM, and asthma admitted to the hospital for symptomatic anemia.  The patient states that she started to notice fatigue and weakness starting back in December after her eye surgery.  Over the months the patient stated to experience more fatigue.  Last week she was encouraged by her sister to seek medical attention sooner as ambulating from her living room to her kitchen caused DOE, tachycardia, and chest tightness.  She was scheduled to see her PCP, but she presented to the ER on 01/25/2022.  Blood work showed that her admission HGB was 4.9 g/dL with an MCV of 57.4.  Imaging of the abdomen showed a colitis, but no masses.  Her colonoscopy in 2016 was essentially normal.  An inflammatory polyp was removed at that time and she was recommended a 10 year follow up.  She only started to experience some mild diarrhea recently.  Prior to this time she denies any issues with abdominal pain, nausea, vomiting, hematemesis, hematochezia, or melena.  During this hospitalization she did see some minor blood on the toilet tissue.  Past Medical History:  Diagnosis Date   Allergy    Arthritis    Asthma    Atrial fibrillation ([place])    h/o of isolated AF associated with hypothyroidism    Diverticulosis    cecum   Diverticulosis    colonoscopy [facility]   Dysphagia 06/23/2021   Fatty liver    GERD (gastroesophageal reflux disease)    Hypertension    Lumbar radiculopathy    Thyroid disease    hypothyroid   TIA (transient ischemic attack)    2006   Urinary incontinence    mixed urge and stress    Vitamin D deficiency     Past Surgical History:  Procedure Laterality Date   ABLATION     thyroid with radioactive iodine ablation   CARPAL TUNNEL RELEASE     left wrist   CHOLECYSTECTOMY     LEFT HEART CATHETERIZATION WITH CORONARY ANGIOGRAM N/A  01/28/2012   Procedure: LEFT HEART CATHETERIZATION WITH CORONARY ANGIOGRAM;  Surgeon: Birdie Riddle, MD;  Location: [facility];  Service: Cardiovascular;  Laterality: N/A;   OTHER SURGICAL HISTORY     carpel tunnel surgery    OTHER SURGICAL HISTORY     carpel tunnel repair   RIGHT HEART CATH  2005-Dr. Doylene Canard   minimal CAD; left circumflex dominant with 10-15% proximal lesion;; normal LVF   TUBAL LIGATION     VAGINAL HYSTERECTOMY     partial hysterectomy 1983 vaginal bleeding    Family History  Problem Relation Age of Onset   Diabetes Mother    Stroke Mother    Cancer Mother        mother <50 at diagnosis (breast); h/o oral cancer   Diabetes Father    Heart disease Father    Stroke Father    Hypertension Father    Diabetes Brother    Colon cancer Brother    Diabetes Sister    Diabetes Paternal Uncle    Cancer Brother        lung   Cancer Brother        throat   Esophageal cancer Neg Hx    Rectal cancer Neg Hx    Stomach cancer Neg Hx     Social History:  reports that she has  never smoked. She has never used smokeless tobacco. She reports that she does not drink alcohol and does not use drugs.  Allergies:  Allergies  Allergen Reactions   Oysters [Shellfish Allergy] Rash    Medications: Scheduled:  amLODipine  10 mg Oral Daily   hydrochlorothiazide  25 mg Oral Daily   irbesartan  150 mg Oral Daily   levothyroxine  100 mcg Oral QAC breakfast   metoprolol succinate  100 mg Oral Daily   Continuous:  Results for orders placed or performed during the hospital encounter of 01/25/22 (from the past 24 hour(s))  Glucose, capillary     Status: Abnormal   Collection Time: 01/26/22  8:00 AM  Result Value Ref Range   Glucose-Capillary 155 (H) 70 - 99 mg/dL  Bilirubin, fractionated(tot/dir/indir)     Status: Abnormal   Collection Time: 01/26/22  8:22 AM  Result Value Ref Range   Total Bilirubin 2.5 (H) 0.3 - 1.2 mg/dL   Bilirubin, Direct 0.2 0.0 - 0.2 mg/dL   Indirect  Bilirubin 2.3 (H) 0.3 - 0.9 mg/dL  CBC     Status: Abnormal   Collection Time: 01/27/22  2:16 AM  Result Value Ref Range   WBC 6.1 4.0 - 10.5 K/uL   RBC 3.80 (L) 3.87 - 5.11 MIL/uL   Hemoglobin 7.1 (L) 12.0 - 15.0 g/dL   HCT 24.3 (L) 36.0 - 46.0 %   MCV 63.9 (L) 80.0 - 100.0 fL   MCH 18.7 (L) 26.0 - 34.0 pg   MCHC 29.2 (L) 30.0 - 36.0 g/dL   RDW 32.7 (H) 11.5 - 15.5 %   Platelets 186 150 - 400 K/uL   nRBC 0.0 0.0 - 0.2 %  Comprehensive metabolic panel     Status: Abnormal   Collection Time: 01/27/22  2:16 AM  Result Value Ref Range   Sodium 137 135 - 145 mmol/L   Potassium 3.6 3.5 - 5.1 mmol/L   Chloride 104 98 - 111 mmol/L   CO2 23 22 - 32 mmol/L   Glucose, Bld 106 (H) 70 - 99 mg/dL   BUN 6 (L) 8 - 23 mg/dL   Creatinine, Ser 0.89 0.44 - 1.00 mg/dL   Calcium 8.7 (L) 8.9 - 10.3 mg/dL   Total Protein 6.1 (L) 6.5 - 8.1 g/dL   Albumin 3.4 (L) 3.5 - 5.0 g/dL   AST 35 15 - 41 U/L   ALT 28 0 - 44 U/L   Alkaline Phosphatase 34 (L) 38 - 126 U/L   Total Bilirubin 2.0 (H) 0.3 - 1.2 mg/dL   GFR, Estimated >60 >60 mL/min   Anion gap 10 5 - 15     CT CHEST ABDOMEN PELVIS W CONTRAST  Result Date: 01/26/2022 CLINICAL DATA:  Chest pain and unintended weight loss in a 70 year old female. EXAM: CT CHEST, ABDOMEN, AND PELVIS WITH CONTRAST TECHNIQUE: Multidetector CT imaging of the chest, abdomen and pelvis was performed following the standard protocol during bolus administration of intravenous contrast. RADIATION DOSE REDUCTION: This exam was performed according to the departmental dose-optimization program which includes automated exposure control, adjustment of the mA and/or kV according to patient size and/or use of iterative reconstruction technique. CONTRAST:  112m OMNIPAQUE IOHEXOL 300 MG/ML  SOLN COMPARISON:  No prior cross-sectional imaging for comparison. FINDINGS: CT CHEST FINDINGS Cardiovascular: Calcified and noncalcified aortic atherosclerosis is mild. Mild dilation of central  pulmonary vessels not beyond 3 cm. Heart size is normal without pericardial effusion or signs of pericardial nodularity. Mediastinum/Nodes: No thoracic  inlet, axillary, mediastinal or hilar adenopathy. Esophagus grossly normal. Lungs/Pleura: Basilar atelectasis. No effusion. No consolidative changes. Musculoskeletal: See below for full musculoskeletal details. CT ABDOMEN PELVIS FINDINGS Hepatobiliary: Liver with smooth contours. Post cholecystectomy. No biliary duct dilation or focal, suspicious hepatic lesion. The portal vein is patent. Pancreas: Normal, without mass, inflammation or ductal dilatation. Spleen: Normal. Adrenals/Urinary Tract: Adrenal glands are normal. Symmetric renal enhancement without hydronephrosis. No suspicious renal lesion, perinephric or perivesical stranding. Mild cortical scarring of the RIGHT greater than LEFT kidney. Stomach/Bowel: Generalized mural stratification of the colon compatible with moderate colitis. No signs of obstruction or pneumatosis. Appendix is normal. Small hiatal hernia. No small bowel dilation or signs of small bowel inflammation. Vascular/Lymphatic: Aortic atherosclerosis. No sign of aneurysm. Smooth contour of the IVC. There is no gastrohepatic or hepatoduodenal ligament lymphadenopathy. No retroperitoneal or mesenteric lymphadenopathy. No pelvic sidewall lymphadenopathy. Reproductive: Post hysterectomy.  No adnexal masses. Other: No ascites.  No pneumoperitoneum. Musculoskeletal: No acute bone finding. No destructive bone process. Spinal degenerative changes. IMPRESSION: 1. Generalized mural stratification of the entire colon suggestive of moderate colitis. No signs of obstruction or pneumatosis. 2. Aortic atherosclerosis. Aortic Atherosclerosis (ICD10-I70.0). Electronically Signed   By: Zetta Bills M.D.   On: 01/26/2022 15:23   DG Chest 2 View  Result Date: 01/25/2022 CLINICAL DATA:  Chest pain EXAM: CHEST - 2 VIEW COMPARISON:  08/11/2015 FINDINGS: Cardiac  size is within normal limits. There are no signs of pulmonary edema or focal pulmonary consolidation. In the PA view, there is an ill-defined 1 cm faint density in the lateral aspect of right apex overlying the anterior right second rib. Rest of the lung fields are unremarkable. There is no pleural effusion or pneumothorax. IMPRESSION: There are no signs of pulmonary edema or focal pulmonary consolidation. There is an ill-defined 1 cm faint opacity in the lateral aspect of right upper lung fields which may suggest an artifact or pleural plaque or parenchymal nodule. Follow-up chest radiographs along with CT if warranted should be considered. Electronically Signed   By: Elmer Picker M.D.   On: 01/25/2022 16:07    ROS:  As stated above in the HPI otherwise negative.  Blood pressure 128/60, pulse 70, temperature 98.3 F (36.8 C), resp. rate 18, height 5' (1.524 m), weight 74.8 kg, SpO2 100 %.    PE: Gen: NAD, Alert and Oriented HEENT:  [place]/AT, EOMI Neck: Supple, no LAD Lungs: CTA Bilaterally CV: RRR without M/G/R ABD: Soft, NTND, +BS Ext: No C/C/E  Assessment/Plan: 1) Symptomatic anemia. 2) Colitis on the CT scan. 3) Mild hematochezia. 4) Mild diarrhea.   With the imaging findings further evaluation with an EGD/colonoscopy will be performed.  The intent is to perform the procedure tomorrow pending scheduling.  Plan: 1) EGD/colonoscopy tomorrow.  Bravlio Luca D 01/27/2022, 7:32 AM

## 2022-01-28 ENCOUNTER — Encounter (HOSPITAL_COMMUNITY): Payer: Self-pay | Admitting: Internal Medicine

## 2022-01-28 ENCOUNTER — Inpatient Hospital Stay (HOSPITAL_COMMUNITY): Payer: Medicare Other | Admitting: Anesthesiology

## 2022-01-28 ENCOUNTER — Encounter (HOSPITAL_COMMUNITY)
Admission: EM | Disposition: A | Discharge status: Home / Self Care | Payer: Self-pay | Source: Ambulatory Visit | Attending: Internal Medicine

## 2022-01-28 DIAGNOSIS — K317 Polyp of stomach and duodenum: Secondary | ICD-10-CM

## 2022-01-28 DIAGNOSIS — K449 Diaphragmatic hernia without obstruction or gangrene: Secondary | ICD-10-CM

## 2022-01-28 DIAGNOSIS — K5730 Diverticulosis of large intestine without perforation or abscess without bleeding: Secondary | ICD-10-CM

## 2022-01-28 DIAGNOSIS — K635 Polyp of colon: Secondary | ICD-10-CM

## 2022-01-28 HISTORY — PX: COLONOSCOPY WITH PROPOFOL: SHX5780

## 2022-01-28 HISTORY — PX: ESOPHAGOGASTRODUODENOSCOPY (EGD) WITH PROPOFOL: SHX5813

## 2022-01-28 HISTORY — PX: HEMOSTASIS CONTROL: SHX6838

## 2022-01-28 HISTORY — PX: HEMOSTASIS CLIP PLACEMENT: SHX6857

## 2022-01-28 HISTORY — PX: POLYPECTOMY: SHX5525

## 2022-01-28 LAB — TYPE AND SCREEN
ABO/RH(D): A POS
Antibody Screen: NEGATIVE
Unit division: 0
Unit division: 0
Unit division: 0

## 2022-01-28 LAB — BASIC METABOLIC PANEL
Anion gap: 10 (ref 5–15)
BUN: 5 mg/dL — ABNORMAL LOW (ref 8–23)
CO2: 24 mmol/L (ref 22–32)
Calcium: 8.8 mg/dL — ABNORMAL LOW (ref 8.9–10.3)
Chloride: 104 mmol/L (ref 98–111)
Creatinine, Ser: 0.89 mg/dL (ref 0.44–1.00)
GFR, Estimated: >60 mL/min (ref >60)
Glucose, Bld: 120 mg/dL — ABNORMAL HIGH (ref 70–99)
Potassium: 3.6 mmol/L (ref 3.5–5.1)
Sodium: 138 mmol/L (ref 135–145)

## 2022-01-28 LAB — GASTROINTESTINAL PANEL BY PCR, STOOL (REPLACES STOOL CULTURE)

## 2022-01-28 LAB — BPAM RBC
Blood Product Expiration Date: 202307052359
Blood Product Expiration Date: 202307052359
Blood Product Expiration Date: 202307052359
ISSUE DATE / TIME: 202306121811
ISSUE DATE / TIME: 202306122144
ISSUE DATE / TIME: 202306141241
Unit Type and Rh: 6200
Unit Type and Rh: 6200
Unit Type and Rh: 6200

## 2022-01-28 LAB — CBC
HCT: 29.2 % — ABNORMAL LOW (ref 36.0–46.0)
Hemoglobin: 9.1 g/dL — ABNORMAL LOW (ref 12.0–15.0)
MCH: 20.7 pg — ABNORMAL LOW (ref 26.0–34.0)
MCHC: 31.2 g/dL (ref 30.0–36.0)
MCV: 66.4 fL — ABNORMAL LOW (ref 80.0–100.0)
Platelets: 147 10*3/uL — ABNORMAL LOW (ref 150–400)
RBC: 4.4 MIL/uL (ref 3.87–5.11)
WBC: 7.7 10*3/uL (ref 4.0–10.5)
nRBC: 0 % (ref 0.0–0.2)

## 2022-01-28 LAB — GLUCOSE, CAPILLARY
Glucose-Capillary: 114 mg/dL — ABNORMAL HIGH (ref 70–99)
Glucose-Capillary: 99 mg/dL (ref 70–99)

## 2022-01-28 SURGERY — COLONOSCOPY WITH PROPOFOL
Anesthesia: Monitor Anesthesia Care

## 2022-01-28 MED ORDER — GLYCOPYRROLATE PF 0.2 MG/ML IJ SOSY
PREFILLED_SYRINGE | INTRAMUSCULAR | Status: DC | PRN
  Administered 2022-01-28: .1 mg via INTRAVENOUS

## 2022-01-28 MED ORDER — PROPOFOL 10 MG/ML IV BOLUS
INTRAVENOUS | Status: DC | PRN
  Administered 2022-01-28: 50 mg via INTRAVENOUS
  Administered 2022-01-28: 20 mg via INTRAVENOUS
  Administered 2022-01-28: 30 mg via INTRAVENOUS

## 2022-01-28 MED ORDER — SODIUM CHLORIDE 0.9 % IV SOLN: INTRAVENOUS | Status: DC

## 2022-01-28 MED ORDER — EPINEPHRINE 1 MG/10ML IJ SOSY
PREFILLED_SYRINGE | INTRAMUSCULAR | Status: DC | PRN
  Administered 2022-01-28: 0.2 mg via SUBCUTANEOUS

## 2022-01-28 MED ORDER — IRBESARTAN 300 MG PO TABS
300.0000 mg | ORAL_TABLET | Freq: Every day | ORAL | Status: DC
  Administered 2022-01-29 – 2022-01-30 (×2): 300 mg via ORAL
  Filled 2022-01-28 (×2): qty 1

## 2022-01-28 MED ORDER — LACTATED RINGERS IV SOLN: INTRAVENOUS | Status: DC

## 2022-01-28 MED ORDER — PROPOFOL 500 MG/50ML IV EMUL
INTRAVENOUS | Status: DC | PRN
  Administered 2022-01-28: 150 ug/kg/min via INTRAVENOUS

## 2022-01-28 SURGICAL SUPPLY — 25 items

## 2022-01-28 NOTE — Anesthesia Procedure Notes (Signed)
Procedure Name: General with mask airway Date/Time: 01/28/2022 2:25 PM  Performed by: Erick Colace, CRNAPre-anesthesia Checklist: Patient identified, Emergency Drugs available, Suction available and Patient being monitored Patient Re-evaluated:Patient Re-evaluated prior to induction Oxygen Delivery Method: Nasal cannula Preoxygenation: Pre-oxygenation with 100% oxygen Induction Type: IV induction Airway Equipment and Method: Bite block Dental Injury: Teeth and Oropharynx as per pre-operative assessment

## 2022-01-28 NOTE — Plan of Care (Signed)
  Problem: Clinical Measurements: Goal: Ability to maintain clinical measurements within normal limits will improve Outcome: Progressing   

## 2022-01-28 NOTE — Interval H&P Note (Signed)
History and Physical Interval Note:  01/28/2022 1:55 PM  Madison Jefferson  has presented today for surgery, with the diagnosis of IDA.  The various methods of treatment have been discussed with the patient and family. After consideration of risks, benefits and other options for treatment, the patient has consented to  Procedure(s): COLONOSCOPY WITH PROPOFOL (N/A) ESOPHAGOGASTRODUODENOSCOPY (EGD) WITH PROPOFOL (N/A) as a surgical intervention.  The patient's history has been reviewed, patient examined, no change in status, stable for surgery.  I have reviewed the patient's chart and labs.  Questions were answered to the patient's satisfaction.     Millenia Waldvogel D

## 2022-01-28 NOTE — Op Note (Signed)
[facility] Patient Name: Madison Jefferson Procedure Date : 01/28/2022 MRN: [id number] Attending MD: Carol Ada , MD Date of Birth: [birth year] CSN: [id number] Age: 71 Admit Type: Inpatient Procedure:                Colonoscopy Indications:              Iron deficiency anemia Providers:                Carol Ada, MD, Ervin Knack, RN, Frazier Richards,                            Technician Referring MD:              Medicines:                Propofol per Anesthesia Complications:            No immediate complications. Estimated Blood Loss:     Estimated blood loss: none. Procedure:                Pre-Anesthesia Assessment:                           - Prior to the procedure, a History and Physical                            was performed, and patient medications and                            allergies were reviewed. The patient's tolerance of                            previous anesthesia was also reviewed. The risks                            and benefits of the procedure and the sedation                            options and risks were discussed with the patient.                            All questions were answered, and informed consent                            was obtained. Prior Anticoagulants: The patient has                            taken no previous anticoagulant or antiplatelet                            agents. ASA Grade Assessment: II - A patient with                            mild systemic disease. After reviewing the risks                            and  benefits, the patient was deemed in                            satisfactory condition to undergo the procedure.                           - Sedation was administered by an anesthesia                            professional. Deep sedation was attained.                           After obtaining informed consent, the colonoscope                            was passed under direct vision. Throughout the                             procedure, the patient's blood pressure, pulse, and                            oxygen saturations were monitored continuously. The                            PCF-190TL ([id number]) Olympus colonoscope was                            introduced through the anus and advanced to the the                            cecum, identified by appendiceal orifice and                            ileocecal valve. The colonoscopy was performed                            without difficulty. The patient tolerated the                            procedure well. The quality of the bowel                            preparation was evaluated using the BBPS [facility]                            Bowel Preparation Scale) with scores of: Right                            Colon = 3, Transverse Colon = 3 and Left Colon = 3                            (entire mucosa seen well with no residual staining,  small fragments of stool or opaque liquid). The                            total BBPS score equals 9. The ileocecal valve,                            appendiceal orifice, and rectum were photographed. Scope In: 2:48:20 PM Scope Out: 3:00:45 PM Scope Withdrawal Time: 0 hours 7 minutes 52 seconds  Total Procedure Duration: 0 hours 12 minutes 25 seconds  Findings:      Two sessile polyps were found in the ascending colon. The polyps were 2       to 3 mm in size. These polyps were removed with a cold snare. Resection       and retrieval were complete. The images of the polyps were not captured.      A few medium-mouthed diverticula were found in the ascending colon. Impression:               - Two 2 to 3 mm polyps in the ascending colon,                            removed with a cold snare. Resected and retrieved.                           - Diverticulosis in the ascending colon. Recommendation:           - Return patient to hospital ward for ongoing care.                           -  Resume regular diet.                           - Continue present medications.                           - Await pathology results. Procedure Code(s):        --- Professional ---                           [phone], Colonoscopy, flexible; with removal of                            tumor(s), polyp(s), or other lesion(s) by snare                            technique Diagnosis Code(s):        --- Professional ---                           K63.5, Polyp of colon                           D50.9, Iron deficiency anemia, unspecified                           K57.30, Diverticulosis of large intestine without  perforation or abscess without bleeding CPT copyright 2019 American [employer]. All rights reserved. The codes documented in this report are preliminary and upon coder review may  be revised to meet current compliance requirements. Carol Ada, MD Carol Ada, MD 01/28/2022 3:49:02 PM This report has been signed electronically. Number of Addenda: 0

## 2022-01-28 NOTE — Op Note (Addendum)
[facility] Patient Name: Madison Jefferson Procedure Date : 01/28/2022 MRN: [id number] Attending MD: Carol Ada , MD Date of Birth: [birth year] CSN: [id number] Age: 71 Admit Type: Inpatient Procedure:                Upper GI endoscopy Indications:              Iron deficiency anemia Providers:                Carol Ada, MD, Ervin Knack, RN, Frazier Richards,                            Technician Referring MD:              Medicines:                Propofol per Anesthesia Complications:            No immediate complications. Estimated Blood Loss:     Estimated blood loss was minimal. Procedure:                Pre-Anesthesia Assessment:                           - Prior to the procedure, a History and Physical                            was performed, and patient medications and                            allergies were reviewed. The patient's tolerance of                            previous anesthesia was also reviewed. The risks                            and benefits of the procedure and the sedation                            options and risks were discussed with the patient.                            All questions were answered, and informed consent                            was obtained. Prior Anticoagulants: The patient has                            taken no previous anticoagulant or antiplatelet                            agents. ASA Grade Assessment: II - A patient with                            mild systemic disease. After reviewing the risks  and benefits, the patient was deemed in                            satisfactory condition to undergo the procedure.                           - Sedation was administered by an anesthesia                            professional. Deep sedation was attained.                           After obtaining informed consent, the endoscope was                            passed under direct vision. Throughout  the                            procedure, the patient's blood pressure, pulse, and                            oxygen saturations were monitored continuously. The                            PCF-190TL ([id number]) Olympus colonoscope was                            introduced through the mouth, and advanced to the                            second part of duodenum. The upper GI endoscopy was                            technically difficult and complex. The patient                            tolerated the procedure well. Scope In: Scope Out: Findings:      A 3 cm hiatal hernia was present.      Multiple 10 to 30 mm pedunculated and sessile polyps with bleeding and       stigmata of recent bleeding were found in the gastric fundus and in the       gastric body. The polyp was removed with a hot snare. Resection and       retrieval were complete. For hemostasis, five hemostatic clips were       successfully placed (MR conditional). There was no bleeding at the end       of the procedure. Area was successfully injected with 2 mL of a 1:10,000       solution of epinephrine for hemostasis.      The examined duodenum was normal.      In the proximal stomach and the body of the stomach multiple large       inflammatory appearing polyps were identified. There was evidence of       bleeding and this was the source for her anemia. The 3 cm  semi-pedunculated polyp was successfully removed and retrieved with the       hot snare. A second resection was performed with a large fundic polyp,       but this was resected too quickly by the endo tech. Moderate oozing       ensued and it was difficult to arrest the bleeding. A total of 2 ml of       1:10,000 Epi was injected and five hemoclips were used to arrest the       bleeding. Because of this complication, and the larger number of polyps       that needed resection, the decision was made to repeat the EGD tomorrow. Impression:               - 3 cm  hiatal hernia.                           - Multiple gastric polyps. Resected and retrieved.                            Clips (MR conditional) were placed. Injected.                           - Normal examined duodenum. Recommendation:           - Return patient to hospital ward for ongoing care.                           - Resume regular diet today.                           - NPO after midnight.                           - Await pathology results.                           - Repeat EGD tomorrow to complete the polyp                            resections. Procedure Code(s):        --- Professional ---                           [id number], Esophagogastroduodenoscopy, flexible,                            transoral; with removal of tumor(s), polyp(s), or                            other lesion(s) by snare technique                           43255, 59, Esophagogastroduodenoscopy, flexible,                            transoral; with control of bleeding, any method Diagnosis Code(s):        --- Professional ---  K31.7, Polyp of stomach and duodenum                           K44.9, Diaphragmatic hernia without obstruction or                            gangrene                           D50.9, Iron deficiency anemia, unspecified CPT copyright 2019 American Medical Association. All rights reserved. The codes documented in this report are preliminary and upon coder review may  be revised to meet current compliance requirements. Carol Ada, MD Carol Ada, MD 01/28/2022 4:08:24 PM This report has been signed electronically. Number of Addenda: 0

## 2022-01-28 NOTE — TOC Progression Note (Signed)
Transition of Care [facility]) - Progression Note    Patient Details  Name: Madison Jefferson MRN: [id number] Date of Birth: March 29, 1951  Transition of Care [facility] CM/SW Contact  Tom-Johnson, Renea Ee, RN Phone Number: 01/28/2022, 11:59 AM  Clinical Narrative:     No f/u noted by PT. CM will continue to follow with care.    Barriers to Discharge: Continued Medical Work up  Expected Discharge Plan and Services     Discharge Planning Services: CM Consult   Living arrangements for the past 2 months: Apartment                                       Social Determinants of Health (SDOH) Interventions    Readmission Risk Interventions     No data to display

## 2022-01-28 NOTE — Transfer of Care (Signed)
Immediate Anesthesia Transfer of Care Note  Patient: Madison Jefferson  Procedure(s) Performed: COLONOSCOPY WITH PROPOFOL ESOPHAGOGASTRODUODENOSCOPY (EGD) WITH PROPOFOL POLYPECTOMY  Patient Location: Short Stay  Anesthesia Type:MAC  Level of Consciousness: drowsy  Airway & Oxygen Therapy: Patient Spontanous Breathing  Post-op Assessment: Report given to RN and Post -op Vital signs reviewed and stable  Post vital signs: Reviewed and stable  Last Vitals:  Vitals Value Taken Time  BP 140/60 01/28/22 1504  Temp    Pulse 77 01/28/22 1505  Resp 18 01/28/22 1505  SpO2 100 % 01/28/22 1505  Vitals shown include unvalidated device data.  Last Pain:  Vitals:   01/28/22 1504  TempSrc:   PainSc: 0-No pain         Complications: No notable events documented.

## 2022-01-28 NOTE — Progress Notes (Signed)
Mobility Specialist Criteria Algorithm Info.   01/28/22 1220  Mobility  Activity Ambulated independently in room;Ambulated independently to bathroom;Dangled on edge of bed (Defer hallway ambulation)  Range of Motion/Exercises Active;All extremities  Level of Assistance Independent  Assistive Device None  Distance Ambulated (ft) 40 ft  Activity Response Tolerated well   Patient received dangling EOB agreeable to participate in mobility. Defer hallway ambulation as she fears for incontinence d/t bowel urgency. Ambulated short distance in room, to and from bathroom independently. Returned to EOB without complaint or incident. Was left dangling with all needs met, call bell in reach.   01/28/2022 12:53 PM  [place] Yerick Eggebrecht, [place], [place]  YWVXU:[phone] Office: [phone]

## 2022-01-28 NOTE — Progress Notes (Addendum)
Patient Summary Neveyah Garzon is a 71 year old female with past medical history of type 2 diabetes, hypothyroidism, asthma, hypertension, possible history of atrial fibrillation, who presents to the ED for 4 weeks of dyspnea and chest tightness on exertion, needed for symptomatic anemia.  Subjective:  No acute events overnight.  She is asymptomatic this morning. Her headache is gone. The bowel prep has been successful. No chest pain, dyspnea, belly pain.   Objective:  Vital signs in last 24 hours: Vitals:   01/28/22 1504 01/28/22 1514 01/28/22 1524 01/28/22 1605  BP: 140/60 (!) 105/58 (!) 175/68 (!) 159/75  Pulse: 80 72 73 68  Resp: '19 12 14 16  '$ Temp: 97.6 F (36.4 C)   97.7 F (36.5 C)  TempSrc: Temporal   Oral  SpO2: 100% 100% 99% 100%  Weight:      Height:       Weight change:   Intake/Output Summary (Last 24 hours) at 01/28/2022 1719 Last data filed at 01/28/2022 1506 Gross per 24 hour  Intake 3700 ml  Output 0 ml  Net 3700 ml    Physical Exam GEN: in no acute distress HEENT: pupils symmetric, EOMI, moist mucus membranes, conjunctival pallor BP:ZWCHENIDPOE warm and well perfused.  PULM: normal work of breathing ABD: soft, non-tender NEURO: no gross motor deficits  Labs:    Latest Ref Rng & Units 01/28/2022    1:49 AM 01/27/2022    5:47 PM 01/27/2022    2:16 AM  CBC  WBC 4.0 - 10.5 K/uL 7.7   6.1   Hemoglobin 12.0 - 15.0 g/dL 9.1  9.3  7.1   Hematocrit 36.0 - 46.0 % 29.2  29.7  24.3   Platelets 150 - 400 K/uL 147   186        Latest Ref Rng & Units 01/28/2022    1:49 AM 01/27/2022    2:16 AM 01/26/2022    8:22 AM  CMP  Glucose 70 - 99 mg/dL 120  106    BUN 8 - 23 mg/dL 5  6    Creatinine 0.44 - 1.00 mg/dL 0.89  0.89    Sodium 135 - 145 mmol/L 138  137    Potassium 3.5 - 5.1 mmol/L 3.6  3.6    Chloride 98 - 111 mmol/L 104  104    CO2 22 - 32 mmol/L 24  23    Calcium 8.9 - 10.3 mg/dL 8.8  8.7    Total Protein 6.5 - 8.1 g/dL  6.1    Total Bilirubin 0.3 -  1.2 mg/dL  2.0  2.5   Alkaline Phos 38 - 126 U/L  34    AST 15 - 41 U/L  35    ALT 0 - 44 U/L  28      Assessment/Plan:  Hospital Problem List: Principal Problem:   Symptomatic anemia Active Problems:   Hypothyroidism   HYPERTENSION, BENIGN   Diabetes (HCC)  Alanii Ramer is a 71 year old female with past medical history of type 2 diabetes, hypothyroidism, asthma, hypertension, possible history of atrial fibrillation, who presents to the ED for 4 weeks of dyspnea and chest tightness on exertion, admission needed for symptomatic anemia.   Symptomatic Iron Deficiency Anemia CT evidence of Colitis EGD showed multiple bleeding polyps, procedure complicated and needs repeat tomorrow, likely the source of bleeding. C.diff negative. GIPP negative. Hgb stable since transfusion 9.1.  -Regular diet for now, NPO at midnight, repeat EGD tomorrow -IV Iron for total of '1900mg'$  repletion -Also will recommend  OBGYN outpatient f/u -Home aspirin held until discharge -Daily CBC   Lung lesion CT scan not able to characterize lung lesion   Hypothyroidism TSH wnl -Continue home levothyroxine  Hypertension More hypertensive today,  -Continue home amlodipine, metoprolol, irbesartan, HCTZ  -Increase irbesartan to '300mg'$  tomorrow (order pended)  Type 2 diabetes A1c 2 months ago was 6 -Home metformin -CBG daily   Diet: Clear liquid diet, NPO at midnight Code: Full code DVT: SCD IVF: N/A  Prior to Admission Living Arrangement: Independent in personal residence Anticipated Discharge Location: home Barriers to Discharge: none anticipated, plan for discharge Friday after [facility] day:  LOS: 2 days  Vertell Novak, Medical Student 01/28/2022, 5:19 PM [phone]

## 2022-01-28 NOTE — Anesthesia Preprocedure Evaluation (Addendum)
Anesthesia Evaluation  Patient identified by MRN, date of birth, ID band Patient awake    Reviewed: Allergy & Precautions, H&P , NPO status , Patient's Chart, lab work & pertinent test results, reviewed documented beta blocker date and time   Airway Mallampati: III  TM Distance: >3 FB Neck ROM: Full    Dental no notable dental hx. (+) Teeth Intact, Dental Advisory Given   Pulmonary asthma ,    Pulmonary exam normal breath sounds clear to auscultation       Cardiovascular hypertension, Pt. on medications and Pt. on home beta blockers  Rhythm:Regular Rate:Normal     Neuro/Psych negative neurological ROS  negative psych ROS   GI/Hepatic Neg liver ROS, GERD  ,  Endo/Other  diabetes, Type 2, Oral Hypoglycemic AgentsHypothyroidism Morbid obesity  Renal/GU negative Renal ROS  negative genitourinary   Musculoskeletal   Abdominal   Peds  Hematology  (+) Blood dyscrasia, anemia ,   Anesthesia Other Findings   Reproductive/Obstetrics negative OB ROS                            Anesthesia Physical Anesthesia Plan  ASA: 3  Anesthesia Plan: MAC   Post-op Pain Management: Minimal or no pain anticipated   Induction: Intravenous  PONV Risk Score and Plan: 2 and Propofol infusion and Treatment may vary due to age or medical condition  Airway Management Planned: Natural Airway and Nasal Cannula  Additional Equipment:   Intra-op Plan:   Post-operative Plan:   Informed Consent: I have reviewed the patients History and Physical, chart, labs and discussed the procedure including the risks, benefits and alternatives for the proposed anesthesia with the patient or authorized representative who has indicated his/her understanding and acceptance.     Dental advisory given  Plan Discussed with: CRNA  Anesthesia Plan Comments:         Anesthesia Quick Evaluation

## 2022-01-28 NOTE — Progress Notes (Shared)
Patient Summary Madison Jefferson is a 71 year old female with past medical history of type 2 diabetes, hypothyroidism, asthma, hypertension, possible history of atrial fibrillation, who presents to the ED for 4 weeks of dyspnea and chest tightness on exertion, needed for symptomatic anemia.  Subjective:  No acute events overnight.  ***  Objective:  Vital signs in last 24 hours: Vitals:   01/27/22 2057 01/28/22 0500 01/28/22 0519 01/28/22 0600  BP: (!) 175/78  (!) 161/67   Pulse: 80  87   Resp: 19  19   Temp: 98.1 F (36.7 C)   98.6 F (37 C)  TempSrc: Oral     SpO2: 100%  100%   Weight:  82.1 kg    Height:       Weight change:   Intake/Output Summary (Last 24 hours) at 01/28/2022 0753 Last data filed at 01/28/2022 0600 Gross per 24 hour  Intake 4600 ml  Output 0 ml  Net 4600 ml    Physical Exam GEN: in no acute distress HEENT: pupils symmetric, EOMI, moist mucus membranes, conjunctival pallor FT:[id number] warm and well perfused.  PULM: normal work of breathing ABD: soft, non-tender NEURO: no gross motor deficits  Labs:    Latest Ref Rng & Units 01/28/2022    1:49 AM 01/27/2022    5:47 PM 01/27/2022    2:16 AM  CBC  WBC 4.0 - 10.5 K/uL 7.7   6.1   Hemoglobin 12.0 - 15.0 g/dL 9.1  9.3  7.1   Hematocrit 36.0 - 46.0 % 29.2  29.7  24.3   Platelets 150 - 400 K/uL 147   186        Latest Ref Rng & Units 01/28/2022    1:49 AM 01/27/2022    2:16 AM 01/26/2022    8:22 AM  CMP  Glucose 70 - 99 mg/dL 120  106    BUN 8 - 23 mg/dL 5  6    Creatinine 0.44 - 1.00 mg/dL 0.89  0.89    Sodium 135 - 145 mmol/L 138  137    Potassium 3.5 - 5.1 mmol/L 3.6  3.6    Chloride 98 - 111 mmol/L 104  104    CO2 22 - 32 mmol/L 24  23    Calcium 8.9 - 10.3 mg/dL 8.8  8.7    Total Protein 6.5 - 8.1 g/dL  6.1    Total Bilirubin 0.3 - 1.2 mg/dL  2.0  2.5   Alkaline Phos 38 - 126 U/L  34    AST 15 - 41 U/L  35    ALT 0 - 44 U/L  28     Imaging:  No new  imaging  Assessment/Plan:  Hospital Problem List: Principal Problem:   Symptomatic anemia Active Problems:   Hypothyroidism   HYPERTENSION, BENIGN   Diabetes (HCC)  Madison Jefferson is a 71 year old female with past medical history of type 2 diabetes, hypothyroidism, asthma, hypertension, possible history of atrial fibrillation, who presents to the ED for 4 weeks of dyspnea and chest tightness on exertion, admission needed for symptomatic anemia.   Symptomatic Iron Deficiency Anemia 2/2 Colitis Hgb stable since transfusion 9.1.  -IV Iron -EGD/Colonoscopy today -Also will recommend OBGYN outpatient f/u -Home aspirin held until discharge   Lung lesion CT scan not able to characterize lung lesion   Hypothyroidism TSH wnl -Continue home levothyroxine  Hypertension -Continue home amlodipine, metoprolol, ARB, HCTZ -BMP tomorrow -> replete potassium today   Type 2 diabetes A1c 2 months ago  was 6 -Restart home metformin today -CBG daily   Diet: Clear liquid diet, NPO at midnight Code: Full code DVT: SCD IVF: N/A  Prior to Admission Living Arrangement: Independent in personal residence Anticipated Discharge Location: home Barriers to Discharge: none anticipated, plan for discharge Friday after [facility] day:  LOS: 2 days  France Ravens, MD 01/28/2022, 7:53 AM [phone]

## 2022-01-28 NOTE — Anesthesia Postprocedure Evaluation (Signed)
Anesthesia Post Note  Patient: Madison Jefferson  Procedure(s) Performed: COLONOSCOPY WITH PROPOFOL ESOPHAGOGASTRODUODENOSCOPY (EGD) WITH PROPOFOL POLYPECTOMY HEMOSTASIS CLIP PLACEMENT HEMOSTASIS CONTROL - EPI     Patient location during evaluation: PACU Anesthesia Type: MAC Level of consciousness: awake and alert Pain management: pain level controlled Vital Signs Assessment: post-procedure vital signs reviewed and stable Respiratory status: spontaneous breathing, nonlabored ventilation, respiratory function stable and patient connected to nasal cannula oxygen Cardiovascular status: stable and blood pressure returned to baseline Postop Assessment: no apparent nausea or vomiting Anesthetic complications: no   No notable events documented.  Last Vitals:  Vitals:   01/28/22 1524 01/28/22 1605  BP: (!) 175/68 (!) 159/75  Pulse: 73 68  Resp: 14 16  Temp:  36.5 C  SpO2: 99% 100%    Last Pain:  Vitals:   01/28/22 1605  TempSrc: Oral  PainSc:                  Effie Berkshire

## 2022-01-29 ENCOUNTER — Encounter (HOSPITAL_COMMUNITY): Payer: Self-pay | Admitting: Internal Medicine

## 2022-01-29 ENCOUNTER — Encounter (HOSPITAL_COMMUNITY)
Admission: EM | Disposition: A | Discharge status: Home / Self Care | Payer: Self-pay | Source: Ambulatory Visit | Attending: Internal Medicine

## 2022-01-29 ENCOUNTER — Inpatient Hospital Stay (HOSPITAL_COMMUNITY): Payer: Medicare Other | Admitting: Certified Registered Nurse Anesthetist

## 2022-01-29 DIAGNOSIS — I10 Essential (primary) hypertension: Secondary | ICD-10-CM

## 2022-01-29 DIAGNOSIS — K317 Polyp of stomach and duodenum: Secondary | ICD-10-CM

## 2022-01-29 DIAGNOSIS — E039 Hypothyroidism, unspecified: Secondary | ICD-10-CM

## 2022-01-29 DIAGNOSIS — K449 Diaphragmatic hernia without obstruction or gangrene: Secondary | ICD-10-CM

## 2022-01-29 HISTORY — PX: ESOPHAGOGASTRODUODENOSCOPY (EGD) WITH PROPOFOL: SHX5813

## 2022-01-29 HISTORY — PX: POLYPECTOMY: SHX5525

## 2022-01-29 HISTORY — PX: HEMOSTASIS CLIP PLACEMENT: SHX6857

## 2022-01-29 LAB — BASIC METABOLIC PANEL
Anion gap: 9 (ref 5–15)
BUN: 6 mg/dL — ABNORMAL LOW (ref 8–23)
CO2: 24 mmol/L (ref 22–32)
Calcium: 8.9 mg/dL (ref 8.9–10.3)
Chloride: 105 mmol/L (ref 98–111)
Creatinine, Ser: 0.93 mg/dL (ref 0.44–1.00)
GFR, Estimated: >60 mL/min (ref >60)
Glucose, Bld: 98 mg/dL (ref 70–99)
Potassium: 3.5 mmol/L (ref 3.5–5.1)
Sodium: 138 mmol/L (ref 135–145)

## 2022-01-29 LAB — CBC
HCT: 28.2 % — ABNORMAL LOW (ref 36.0–46.0)
Hemoglobin: 8.8 g/dL — ABNORMAL LOW (ref 12.0–15.0)
MCH: 21.3 pg — ABNORMAL LOW (ref 26.0–34.0)
MCHC: 31.2 g/dL (ref 30.0–36.0)
MCV: 68.3 fL — ABNORMAL LOW (ref 80.0–100.0)
Platelets: 100 10*3/uL — ABNORMAL LOW (ref 150–400)
RBC: 4.13 MIL/uL (ref 3.87–5.11)
WBC: 6.9 10*3/uL (ref 4.0–10.5)
nRBC: 0 % (ref 0.0–0.2)

## 2022-01-29 LAB — GLUCOSE, CAPILLARY: Glucose-Capillary: 109 mg/dL — ABNORMAL HIGH (ref 70–99)

## 2022-01-29 SURGERY — ESOPHAGOGASTRODUODENOSCOPY (EGD) WITH PROPOFOL
Anesthesia: Monitor Anesthesia Care

## 2022-01-29 MED ORDER — LACTATED RINGERS IV SOLN: INTRAVENOUS | Status: DC | PRN

## 2022-01-29 MED ORDER — PROPOFOL 500 MG/50ML IV EMUL
INTRAVENOUS | Status: DC | PRN
  Administered 2022-01-29: 75 ug/kg/min via INTRAVENOUS

## 2022-01-29 MED ORDER — ONDANSETRON HCL 4 MG/2ML IJ SOLN
INTRAMUSCULAR | Status: AC
  Filled 2022-01-29: qty 2

## 2022-01-29 MED ORDER — EPHEDRINE SULFATE-NACL 50-0.9 MG/10ML-% IV SOSY
PREFILLED_SYRINGE | INTRAVENOUS | Status: DC | PRN
  Administered 2022-01-29: 5 mg via INTRAVENOUS

## 2022-01-29 MED ORDER — ONDANSETRON HCL 4 MG/2ML IJ SOLN
4.0000 mg | Freq: Once | INTRAMUSCULAR | Status: AC
  Administered 2022-01-29: 4 mg via INTRAVENOUS

## 2022-01-29 MED ORDER — SODIUM CHLORIDE 0.9 % IV SOLN
125.0000 mg | Freq: Once | INTRAVENOUS | Status: AC
  Administered 2022-01-29: 125 mg via INTRAVENOUS
  Filled 2022-01-29: qty 10

## 2022-01-29 SURGICAL SUPPLY — 15 items

## 2022-01-29 NOTE — Anesthesia Postprocedure Evaluation (Signed)
Anesthesia Post Note  Patient: KATIYA FIKE  Procedure(s) Performed: ESOPHAGOGASTRODUODENOSCOPY (EGD) WITH PROPOFOL POLYPECTOMY HEMOSTASIS CLIP PLACEMENT     Patient location during evaluation: Endoscopy Anesthesia Type: MAC Level of consciousness: awake and alert Pain management: pain level controlled Vital Signs Assessment: post-procedure vital signs reviewed and stable Respiratory status: spontaneous breathing, nonlabored ventilation and respiratory function stable Cardiovascular status: stable and blood pressure returned to baseline Postop Assessment: no apparent nausea or vomiting Anesthetic complications: no   No notable events documented.  Last Vitals:  Vitals:   01/29/22 1720 01/29/22 1744  BP: (!) 146/57 (!) 156/62  Pulse: 65 63  Resp: 14 18  Temp:  36.5 C  SpO2: 97% 99%    Last Pain:  Vitals:   01/29/22 1744  TempSrc: Oral  PainSc:                  Brynn Reznik

## 2022-01-29 NOTE — Anesthesia Preprocedure Evaluation (Signed)
Anesthesia Evaluation  Patient identified by MRN, date of birth, ID band Patient awake    Reviewed: Allergy & Precautions, NPO status , Patient's Chart, lab work & pertinent test results  History of Anesthesia Complications Negative for: history of anesthetic complications  Airway Mallampati: III  TM Distance: >3 FB Neck ROM: Full    Dental   Pulmonary asthma ,    Pulmonary exam normal        Cardiovascular hypertension, Normal cardiovascular exam+ dysrhythmias Atrial Fibrillation      Neuro/Psych negative neurological ROS     GI/Hepatic Neg liver ROS, GERD  ,Bleeding gastric polyps   Endo/Other  Hypothyroidism   Renal/GU negative Renal ROS  negative genitourinary   Musculoskeletal negative musculoskeletal ROS (+)   Abdominal   Peds  Hematology negative hematology ROS (+)   Anesthesia Other Findings   Reproductive/Obstetrics                            Anesthesia Physical Anesthesia Plan  ASA: 3  Anesthesia Plan: MAC   Post-op Pain Management: Minimal or no pain anticipated   Induction: Intravenous  PONV Risk Score and Plan: 2 and Propofol infusion, TIVA and Treatment may vary due to age or medical condition  Airway Management Planned: Natural Airway, Nasal Cannula and Simple Face Mask  Additional Equipment: None  Intra-op Plan:   Post-operative Plan:   Informed Consent: I have reviewed the patients History and Physical, chart, labs and discussed the procedure including the risks, benefits and alternatives for the proposed anesthesia with the patient or authorized representative who has indicated his/her understanding and acceptance.       Plan Discussed with:   Anesthesia Plan Comments:         Anesthesia Quick Evaluation

## 2022-01-29 NOTE — Interval H&P Note (Signed)
History and Physical Interval Note:  01/29/2022 3:41 PM  Madison Jefferson  has presented today for surgery, with the diagnosis of Bleeding gastric polyps.  The various methods of treatment have been discussed with the patient and family. After consideration of risks, benefits and other options for treatment, the patient has consented to  Procedure(s): ESOPHAGOGASTRODUODENOSCOPY (EGD) WITH PROPOFOL (N/A) as a surgical intervention.  The patient's history has been reviewed, patient examined, no change in status, stable for surgery.  I have reviewed the patient's chart and labs.  Questions were answered to the patient's satisfaction.     Anival Pasha D

## 2022-01-29 NOTE — Op Note (Signed)
[facility] Patient Name: Madison Jefferson Procedure Date : 01/29/2022 MRN: [id number] Attending MD: Carol Ada , MD Date of Birth: 1951/02/09 CSN: [id number] Age: 71 Admit Type: Inpatient Procedure:                Upper GI endoscopy Indications:              Therapeutic procedure Providers:                Carol Ada, MD, Dulcy Fanny, William Dalton, Technician Referring MD:              Medicines:                Propofol per Anesthesia Complications:            No immediate complications. Estimated Blood Loss:     Estimated blood loss: none. Procedure:                Pre-Anesthesia Assessment:                           - Prior to the procedure, a History and Physical                            was performed, and patient medications and                            allergies were reviewed. The patient's tolerance of                            previous anesthesia was also reviewed. The risks                            and benefits of the procedure and the sedation                            options and risks were discussed with the patient.                            All questions were answered, and informed consent                            was obtained. Prior Anticoagulants: The patient has                            taken no previous anticoagulant or antiplatelet                            agents. ASA Grade Assessment: III - A patient with                            severe systemic disease. After reviewing the risks                            and  benefits, the patient was deemed in                            satisfactory condition to undergo the procedure.                           - Sedation was administered by an anesthesia                            professional. Deep sedation was attained.                           After obtaining informed consent, the endoscope was                            passed under direct vision. Throughout the                             procedure, the patient's blood pressure, pulse, and                            oxygen saturations were monitored continuously. The                            [id number]-H190 ([id number] Olympus endoscope was introduced                            through the mouth, and advanced to the second part                            of duodenum. The upper GI endoscopy was technically                            difficult and complex. The patient tolerated the                            procedure well. Scope In: Scope Out: Findings:      A 3 cm hiatal hernia was present.      Five 5 to 15 mm pedunculated and sessile polyps with bleeding and       stigmata of recent bleeding were found in the cardia and in the gastric       fundus. The polyp was removed with a hot snare. Resection and retrieval       were complete. To prevent bleeding post-intervention, six hemostatic       clips were successfully placed (MR conditional). There was no bleeding       at the end of the procedure.      The examined duodenum was normal. Impression:               - 3 cm hiatal hernia.                           - Five gastric polyps. Resected and retrieved.  Clips (MR conditional) were placed.                           - Normal examined duodenum. Recommendation:           - Return patient to hospital ward for ongoing care.                           - Resume regular diet.                           - Continue present medications.                           - Await pathology results.                           - Repeat upper endoscopy in 4-6 weeks for                            surveillance. Procedure Code(s):        --- Professional ---                           [phone], Esophagogastroduodenoscopy, flexible,                            transoral; with removal of tumor(s), polyp(s), or                            other lesion(s) by snare technique Diagnosis Code(s):        ---  Professional ---                           K31.7, Polyp of stomach and duodenum                           K44.9, Diaphragmatic hernia without obstruction or                            gangrene CPT copyright 2019 [employer]. All rights reserved. The codes documented in this report are preliminary and upon coder review may  be revised to meet current compliance requirements. Carol Ada, MD Carol Ada, MD 01/29/2022 5:08:54 PM This report has been signed electronically. Number of Addenda: 0

## 2022-01-29 NOTE — Transfer of Care (Signed)
Immediate Anesthesia Transfer of Care Note  Patient: Madison Jefferson  Procedure(s) Performed: ESOPHAGOGASTRODUODENOSCOPY (EGD) WITH PROPOFOL POLYPECTOMY HEMOSTASIS CLIP PLACEMENT  Patient Location: PACU  Anesthesia Type:MAC  Level of Consciousness: awake, alert  and oriented  Airway & Oxygen Therapy: Patient Spontanous Breathing  Post-op Assessment: Report given to RN and Post -op Vital signs reviewed and stable  Post vital signs: Reviewed and stable  Last Vitals:  Vitals Value Taken Time  BP 130/54 01/29/22 1708  Temp 36.9 C 01/29/22 1705  Pulse 74 01/29/22 1709  Resp 21 01/29/22 1709  SpO2 99 % 01/29/22 1709  Vitals shown include unvalidated device data.  Last Pain:  Vitals:   01/29/22 1451  TempSrc: Temporal  PainSc: 0-No pain         Complications: No notable events documented.

## 2022-01-29 NOTE — Progress Notes (Signed)
HD#3 Subjective:  Overnight Events: NAEON  No GI symptoms.  Patient denies any problems with diarrhea or constipation.  Patient had a bowel movement this morning which was soft.  Patient understands current plans for repeat EGD to do complete resection of inflammatory polyps in stomach.  Objective:  Vital signs in last 24 hours: Vitals:   01/28/22 2101 01/29/22 0500 01/29/22 0501 01/29/22 1012  BP: (!) 128/58  (!) 134/58 (!) 154/61  Pulse: 72  66 64  Resp: '17  19 18  '$ Temp: 98.2 F (36.8 C)  98.1 F (36.7 C) 98.4 F (36.9 C)  TempSrc: Oral  Oral Oral  SpO2: 100%  99% 100%  Weight:  82.3 kg    Height:       Supplemental O2: Room Air SpO2: 100 %   Physical Exam:  Physical Exam HENT:     Head: Normocephalic and atraumatic.     Mouth/Throat:     Mouth: Mucous membranes are dry.  Cardiovascular:     Rate and Rhythm: Normal rate and regular rhythm.     Pulses: Normal pulses.     Heart sounds: Normal heart sounds.  Abdominal:     General: Abdomen is flat. Bowel sounds are normal. There is no distension.     Palpations: Abdomen is soft. There is no mass.     Tenderness: There is no abdominal tenderness. There is no right CVA tenderness, left CVA tenderness, guarding or rebound.     Hernia: No hernia is present.  Musculoskeletal:        General: No swelling, tenderness or signs of injury.     Right lower leg: No edema.     Left lower leg: No edema.  Skin:    General: Skin is warm and dry.     Capillary Refill: Capillary refill takes less than 2 seconds.  Neurological:     Mental Status: She is alert.     Filed Weights   01/28/22 0500 01/28/22 1350 01/29/22 0500  Weight: 82.1 kg 82.1 kg 82.3 kg     Intake/Output Summary (Last 24 hours) at 01/29/2022 1039 Last data filed at 01/29/2022 0823 Gross per 24 hour  Intake 940 ml  Output --  Net 940 ml   Net IO Since Admission: 9,065.87 mL [01/29/22 1039]  Pertinent Labs:    Latest Ref Rng & Units 01/29/2022     3:10 AM 01/28/2022    1:49 AM 01/27/2022    5:47 PM  CBC  WBC 4.0 - 10.5 K/uL 6.9  7.7    Hemoglobin 12.0 - 15.0 g/dL 8.8  9.1  9.3   Hematocrit 36.0 - 46.0 % 28.2  29.2  29.7   Platelets 150 - 400 K/uL 100  147         Latest Ref Rng & Units 01/29/2022    3:10 AM 01/28/2022    1:49 AM 01/27/2022    2:16 AM  CMP  Glucose 70 - 99 mg/dL 98  120  106   BUN 8 - 23 mg/dL '6  5  6   '$ Creatinine 0.44 - 1.00 mg/dL 0.93  0.89  0.89   Sodium 135 - 145 mmol/L 138  138  137   Potassium 3.5 - 5.1 mmol/L 3.5  3.6  3.6   Chloride 98 - 111 mmol/L 105  104  104   CO2 22 - 32 mmol/L '24  24  23   '$ Calcium 8.9 - 10.3 mg/dL 8.9  8.8  8.7  Total Protein 6.5 - 8.1 g/dL   6.1   Total Bilirubin 0.3 - 1.2 mg/dL   2.0   Alkaline Phos 38 - 126 U/L   34   AST 15 - 41 U/L   35   ALT 0 - 44 U/L   28     Imaging: No results found.  Assessment/Plan:   Principal Problem:   Symptomatic anemia Active Problems:   Hypothyroidism   HYPERTENSION, BENIGN   Diabetes ([place])   Patient Summary: Madison Jefferson is a 71 year old female with past medical history of type 2 diabetes, hypothyroidism, asthma, hypertension, possible history of atrial fibrillation, who presents to the ED for 4 weeks of dyspnea and chest tightness on exertion, needed for symptomatic anemia.   Symptomatic Iron Deficiency Anemia 2/2 Colitis Hgb stable since transfusion 9.1. Per Nile Riggs equation and today's CBC, still requires 1132 mg iron to achieve Hgb 12.  -IV Ferrelecit 125 mg  today. -Repeat EGD today -NPO since midnight -Also will recommend OBGYN outpatient f/u -Home aspirin held until discharge   Lung lesion CT scan not able to characterize lung lesion -Outpatient follow up and management   Hypothyroidism TSH wnl this admission -Continue home levothyroxine   Hypertension -Continue home amlodipine, metoprolol, HCTZ -Increase Avapro to 300 mg qd   Type 2 diabetes A1c 2 months ago was 6 -Continue home metformin 500 mg qd -CBG  daily   Diet: NPO, resume diet after procedure Code: Full code DVT: SCD IVF: N/A   Prior to Admission Living Arrangement: Independent in personal residence Anticipated Discharge Location: home Barriers to Discharge: none anticipated, plan for discharge Saturday after EGD if hgb stable  France Ravens, MD 01/29/2022, 10:39 AM Pager: [phone]  Please contact the on call pager after 5 pm and on weekends at [phone].

## 2022-01-29 NOTE — Progress Notes (Signed)
Pt returned to room 5M12 after endo. Received report from Burton, Therapist, sports. See reassessment.

## 2022-01-30 DIAGNOSIS — E876 Hypokalemia: Secondary | ICD-10-CM

## 2022-01-30 LAB — CBC
HCT: 28.2 % — ABNORMAL LOW (ref 36.0–46.0)
Hemoglobin: 8.9 g/dL — ABNORMAL LOW (ref 12.0–15.0)
MCH: 21.8 pg — ABNORMAL LOW (ref 26.0–34.0)
MCHC: 31.6 g/dL (ref 30.0–36.0)
MCV: 68.9 fL — ABNORMAL LOW (ref 80.0–100.0)
Platelets: 76 10*3/uL — ABNORMAL LOW (ref 150–400)
RBC: 4.09 MIL/uL (ref 3.87–5.11)
WBC: 6.6 10*3/uL (ref 4.0–10.5)
nRBC: 0.5 % — ABNORMAL HIGH (ref 0.0–0.2)

## 2022-01-30 LAB — BASIC METABOLIC PANEL
Anion gap: 9 (ref 5–15)
BUN: 5 mg/dL — ABNORMAL LOW (ref 8–23)
CO2: 24 mmol/L (ref 22–32)
Calcium: 8.4 mg/dL — ABNORMAL LOW (ref 8.9–10.3)
Chloride: 103 mmol/L (ref 98–111)
Creatinine, Ser: 0.97 mg/dL (ref 0.44–1.00)
GFR, Estimated: >60 mL/min (ref >60)
Glucose, Bld: 101 mg/dL — ABNORMAL HIGH (ref 70–99)
Potassium: 3.2 mmol/L — ABNORMAL LOW (ref 3.5–5.1)
Sodium: 136 mmol/L (ref 135–145)

## 2022-01-30 LAB — GLUCOSE, CAPILLARY: Glucose-Capillary: 98 mg/dL (ref 70–99)

## 2022-01-30 MED ORDER — POTASSIUM CHLORIDE 20 MEQ PO PACK: 40.0000 meq | PACK | ORAL | Status: DC

## 2022-01-30 MED ORDER — POTASSIUM CHLORIDE CRYS ER 20 MEQ PO TBCR: 40.0000 meq | EXTENDED_RELEASE_TABLET | Freq: Two times a day (BID) | ORAL | 0 refills | Status: DC

## 2022-01-30 MED ORDER — FERROUS SULFATE 325 (65 FE) MG PO TABS
325.0000 mg | ORAL_TABLET | Freq: Every day | ORAL | 3 refills | Status: DC
Start: 2022-01-30 — End: 2022-09-29

## 2022-01-30 MED ORDER — POLYETHYLENE GLYCOL 3350 17 G PO PACK: 17.0000 g | PACK | Freq: Every day | ORAL | 0 refills | Status: AC | PRN

## 2022-01-30 NOTE — Progress Notes (Signed)
Patient alert and oriented, verbalized understanding of dc instructions. All belongings given to patient

## 2022-01-30 NOTE — Progress Notes (Signed)
Patient prior to discharge was supposed to receive 2 doses of 40 mEq potassium prior to discharge for potassium repletion however the patient never received this.  I have called patient and patient has been informed that she needs to take 2 doses of 40 mill equivalent potassium which was sent to the pharmacy of her choosing.  Patient was amenable to plan as she has to go to pick up iron supplementation as well.  Madison Ravens, MD PGY1 Resident

## 2022-01-30 NOTE — Discharge Summary (Addendum)
Name: Madison Jefferson MRN: [id number] DOB: 08/12/1951 71 y.o. PCP: Aldine Contes, MD  Date of Admission: 01/25/2022  3:17 PM Date of Discharge: 01/30/2022 Attending Physician: No att. providers found  Discharge Diagnosis: 1. Symptomatic iron deficiency anemia due to upper GI bleed 2. Gastric Polyps  3. Ascending Colon Sessile Polyps 4.  Lung lesion 5.  Hypothyroidism 6.  Hypertension 7.  Type 2 diabetes 8. Thrombocytopenia 9. Hypokalemia   Discharge Medications: Allergies as of 01/30/2022       Reactions   Oysters [shellfish Allergy] Rash        Medication List     STOP taking these medications    aspirin EC 81 MG tablet Commonly known as: Aspirin Low Dose       TAKE these medications    Accu-Chek Guide test strip Generic drug: glucose blood Check blood sugar up to 1 time per day   Accu-Chek Guide w/Device Kit Check blood sugar up to 1 time a day   Accu-Chek Softclix Lancets lancets Check blood sugar up to 1 time a day   albuterol 108 (90 Base) MCG/ACT inhaler Commonly known as: VENTOLIN HFA INHALE 1 TO 2 PUFFS INTO THE LUNGS EVERY 6 HOURS AS NEEDED FOR WHEEZING OR SHORTNESS OF BREATH What changed:  how much to take how to take this when to take this reasons to take this additional instructions   cholecalciferol 25 MCG (1000 UNIT) tablet Commonly known as: VITAMIN D3 Take 1 tablet (1,000 Units total) by mouth daily.   ferrous sulfate 325 (65 FE) MG tablet Take 1 tablet (325 mg total) by mouth daily.   levothyroxine 100 MCG tablet Commonly known as: SYNTHROID TAKE 1 TABLET(100 MCG) BY MOUTH DAILY What changed: See the new instructions.   loratadine 10 MG tablet Commonly known as: CLARITIN TAKE 1 TABLET(10 MG) BY MOUTH DAILY What changed: See the new instructions.   metFORMIN 500 MG 24 hr tablet Commonly known as: GLUCOPHAGE-XR Take 1 tablet in the morning with breakfast and 2 tablets at night with dinner What changed:  how much to  take how to take this when to take this additional instructions   metoprolol succinate 100 MG 24 hr tablet Commonly known as: TOPROL-XL TAKE 1 TABLET BY MOUTH EVERY DAY WITH OR IMMEDIATELY FOLLOWING A MEAL   mometasone 50 MCG/ACT nasal spray Commonly known as: NASONEX Place 2 sprays into the nose daily.   Olmesartan-amLODIPine-HCTZ 40-10-25 MG Tabs TAKE 1 TABLET BY MOUTH DAILY   polyethylene glycol 17 g packet Commonly known as: MiraLax Take 17 g by mouth daily as needed for moderate constipation.   potassium chloride SA 20 MEQ tablet Commonly known as: KLOR-CON M Take 2 tablets (40 mEq total) by mouth 2 (two) times daily for 2 doses.        Disposition and follow-up:   Madison Jefferson was discharged from [facility] in Stable condition.  At the hospital follow up visit please address:  1.   Iron deficiency anemia -Please assess for any symptoms of anemia including increased fatigability on exertion, dyspnea on exertion, chest pain on exertion, conjunctival pallor. -Repeat CBC to assess for worsening of Hgb (8.9 on discharge) -Assess if has GI follow up for surveillance EGD some time 02/2022.   Lung Lesion -CT scan if warranted. CXR during admission did not characterize lung lesion well but did show 1 cm faint opacity in R upper lung field.  Hypokalemia -BMP and replete electrolytes as needed  Thrombocytopenia -Repeat  CBC to monitor  Weight Loss -Monitor weight and assess if all health screenings are up to date  Hypertension -Assess if BP appropriate and adjust BP meds as necessary  2.  Labs / imaging needed at time of follow-up: CBC, BMP  3.  Pending labs/ test needing follow-up: surgical pathology report for EGD/Colonoscopy polyps (GI will follow up with patient)  Follow-up Appointments:  Follow-up Information     Aldine Contes, MD. Schedule an appointment as soon as possible for a visit.   Specialty: Internal Medicine Why: [facility] will  reach out to make a hospital follow up visit for you. Please reach out if they do not contacted you by next Tuesday. Contact information: [address], [address] [place] [address] [phone]         Carol Ada, MD. Call.   Specialty: Gastroenterology Why: Please call on Monday to schedule 4-6 weeks. Contact information: [place], [place] [address] [phone]                 Hospital Course by problem list:  Madison Jefferson is a 71 year old female with past medical history of type 2 diabetes, hypothyroidism, asthma, hypertension, possible history of atrial fibrillation, who presents to the ED for 4 weeks of dyspnea and chest tightness on exertion, admitted for symptomatic anemia due to bleeding gastric polyps. Hospital course is detailed below:  #Upper GI Bleed secondary to gastric polyps #Symptomatic anemia Reported subacute fatigue, chest pain, diarrhea on admission. CBC revealed a hemoglobin of 4.9 and iron panel consistent with severe iron deficiency. She received 3 total units of packed red blood cells as well as 733m total IV iron. She was without active signs of cutaneous, musculoskeletal, or GI bleeding during he stay.  GI was consulted to assess for GI bleeding.  EGD was performed which showed multiple bleeding gastric polyps that were likely the cause of her symptomatic anemia.  Patient required a repeat EGD due to complication from first EGD; however, complete resection of the gastric polyps was performed.  Colonoscopy also showed 2 sessile polyp polyps that were resected and sent for pathology. On day of discharge, she had a hemoglobin of 8.9. She was discharged with oral daily iron.  #Weight loss She has had a 20 pounds weight loss within the past year, which she reports as intentional. Her last colonoscopy in 2016 did not show any signs of malignancy. She reports being due for a pap smear, with normal pap smear results in the past.  No reported change in bowel habits, abdominal pain, or GU symptoms. CT C/A/P w/ IV and GI contrast did not reveal any obvious masses.  Recommend outpatient follow-up as well as assessment whether patient has completed all her necessary healthcare screenings.  #Hypokalmemia K repleted as needed. On discharge K 3.3. Prescribed 2 doses of 40 meq Kdur for repletion.  #Thromboyctopenia Patient's platelets initially were 378 on admission but began downtrending to 76 on discharge. Patient has not noticed any bleeding or easy bruising while in the hospital. Recommend outpatient monitoring.  Other chronic conditions were medically managed with home medications and formulary alternatives as necessary (Hypothyroidism, hypertension, type 2 diabetes, Tiny paraclinoid right ICA aneurysm )    Discharge Exam:   BP (!) 143/59 (BP Location: Right Arm)   Pulse 73   Temp 99.6 F (37.6 C) (Oral)   Resp 18   Ht 5' (1.524 m)   Wt 82.2 kg   SpO2 100%   BMI 35.39 kg/m  Discharge exam:  Physical Exam Constitutional:      General: She is not in acute distress.    Appearance: She is normal weight. She is not ill-appearing.  HENT:     Head: Normocephalic and atraumatic.  Cardiovascular:     Rate and Rhythm: Normal rate and regular rhythm.     Heart sounds: Normal heart sounds.  Pulmonary:     Effort: Pulmonary effort is normal.     Breath sounds: Normal breath sounds.  Abdominal:     General: Bowel sounds are normal.     Palpations: Abdomen is soft.  Musculoskeletal:        General: Normal range of motion.  Neurological:     General: No focal deficit present.     Mental Status: She is alert and oriented to person, place, and time.  Psychiatric:        Mood and Affect: Mood normal.        Behavior: Behavior normal.     Pertinent Labs, Studies, and Procedures:  CT CHEST ABDOMEN PELVIS W CONTRAST  Result Date: 01/26/2022 CLINICAL DATA:  Chest pain and unintended weight loss in a 71 year old  female. EXAM: CT CHEST, ABDOMEN, AND PELVIS WITH CONTRAST TECHNIQUE: Multidetector CT imaging of the chest, abdomen and pelvis was performed following the standard protocol during bolus administration of intravenous contrast. RADIATION DOSE REDUCTION: This exam was performed according to the departmental dose-optimization program which includes automated exposure control, adjustment of the mA and/or kV according to patient size and/or use of iterative reconstruction technique. CONTRAST:  119m OMNIPAQUE IOHEXOL 300 MG/ML  SOLN COMPARISON:  No prior cross-sectional imaging for comparison. FINDINGS: CT CHEST FINDINGS Cardiovascular: Calcified and noncalcified aortic atherosclerosis is mild. Mild dilation of central pulmonary vessels not beyond 3 cm. Heart size is normal without pericardial effusion or signs of pericardial nodularity. Mediastinum/Nodes: No thoracic inlet, axillary, mediastinal or hilar adenopathy. Esophagus grossly normal. Lungs/Pleura: Basilar atelectasis. No effusion. No consolidative changes. Musculoskeletal: See below for full musculoskeletal details. CT ABDOMEN PELVIS FINDINGS Hepatobiliary: Liver with smooth contours. Post cholecystectomy. No biliary duct dilation or focal, suspicious hepatic lesion. The portal vein is patent. Pancreas: Normal, without mass, inflammation or ductal dilatation. Spleen: Normal. Adrenals/Urinary Tract: Adrenal glands are normal. Symmetric renal enhancement without hydronephrosis. No suspicious renal lesion, perinephric or perivesical stranding. Mild cortical scarring of the RIGHT greater than LEFT kidney. Stomach/Bowel: Generalized mural stratification of the colon compatible with moderate colitis. No signs of obstruction or pneumatosis. Appendix is normal. Small hiatal hernia. No small bowel dilation or signs of small bowel inflammation. Vascular/Lymphatic: Aortic atherosclerosis. No sign of aneurysm. Smooth contour of the IVC. There is no gastrohepatic or  hepatoduodenal ligament lymphadenopathy. No retroperitoneal or mesenteric lymphadenopathy. No pelvic sidewall lymphadenopathy. Reproductive: Post hysterectomy.  No adnexal masses. Other: No ascites.  No pneumoperitoneum. Musculoskeletal: No acute bone finding. No destructive bone process. Spinal degenerative changes. IMPRESSION: 1. Generalized mural stratification of the entire colon suggestive of moderate colitis. No signs of obstruction or pneumatosis. 2. Aortic atherosclerosis. Aortic Atherosclerosis (ICD10-I70.0). Electronically Signed   By: GZetta BillsM.D.   On: 01/26/2022 15:23   DG Chest 2 View  Result Date: 01/25/2022 CLINICAL DATA:  Chest pain EXAM: CHEST - 2 VIEW COMPARISON:  08/11/2015 FINDINGS: Cardiac size is within normal limits. There are no signs of pulmonary edema or focal pulmonary consolidation. In the PA view, there is an ill-defined 1 cm faint density in the lateral aspect of right apex overlying the anterior right second rib.  Rest of the lung fields are unremarkable. There is no pleural effusion or pneumothorax. IMPRESSION: There are no signs of pulmonary edema or focal pulmonary consolidation. There is an ill-defined 1 cm faint opacity in the lateral aspect of right upper lung fields which may suggest an artifact or pleural plaque or parenchymal nodule. Follow-up chest radiographs along with CT if warranted should be considered. Electronically Signed   By: Elmer Picker M.D.   On: 01/25/2022 16:07      Latest Ref Rng & Units 01/30/2022    3:32 AM 01/29/2022    3:10 AM 01/28/2022    1:49 AM  CBC  WBC 4.0 - 10.5 K/uL 6.6  6.9  7.7   Hemoglobin 12.0 - 15.0 g/dL 8.9  8.8  9.1   Hematocrit 36.0 - 46.0 % 28.2  28.2  29.2   Platelets 150 - 400 K/uL 76  100  147       Latest Ref Rng & Units 01/30/2022    3:32 AM 01/29/2022    3:10 AM 01/28/2022    1:49 AM  BMP  Glucose 70 - 99 mg/dL 101  98  120   BUN 8 - 23 mg/dL 5  6  5    Creatinine 0.44 - 1.00 mg/dL 0.97  0.93  0.89    Sodium 135 - 145 mmol/L 136  138  138   Potassium 3.5 - 5.1 mmol/L 3.2  3.5  3.6   Chloride 98 - 111 mmol/L 103  105  104   CO2 22 - 32 mmol/L 24  24  24    Calcium 8.9 - 10.3 mg/dL 8.4  8.9  8.8     Discharge Instructions: Discharge Instructions     Call MD for:  difficulty breathing, headache or visual disturbances   Complete by: As directed    Call MD for:  persistant nausea and vomiting   Complete by: As directed    Call MD for:  severe uncontrolled pain   Complete by: As directed    Call MD for:  temperature >100.4   Complete by: As directed    Diet - low sodium heart healthy   Complete by: As directed    Discharge instructions   Complete by: As directed    Dear Ms. Eastham,  It was a pleasure taking care of you while you are in the hospital.  You were admitted due to significant bleeding that we discovered was from your stomach due to many bleeding polyps.  You also had a colonoscopy that showed 2 polyps that were also taken out.  The GI doctors will follow-up with you regarding what they find regarding the polyps that they took out.  You will need an outpatient EGD in 4 to 6 weeks to see if you have further bleeding or polyps in your stomach.   We have not made any changes to your medications beyond stopping your aspirin and starting an iron tablet to help with your iron supplementation.  Please follow-up with your outpatient PCP early next week for a hospital follow-up visit and they can decide whether you should resume aspirin at that time.  If you have any worsening symptoms including the ones that brought you into the hospital or notice you are having darkening of stools or vomiting blood, please call your primary care doctor or return to the ED.  Take care! -[place] [facility]   Increase activity slowly   Complete by: As directed        Signed:  France Ravens, MD 01/30/2022, 12:59 PM   Pager: [phone]

## 2022-01-30 NOTE — Progress Notes (Signed)
Pt being taken down to vehicle at this time by WC.   Foster Simpson Huyen Perazzo

## 2022-01-31 ENCOUNTER — Encounter (HOSPITAL_COMMUNITY): Payer: Self-pay | Admitting: Gastroenterology

## 2022-02-01 ENCOUNTER — Encounter (HOSPITAL_COMMUNITY): Payer: Self-pay | Admitting: Gastroenterology

## 2022-02-01 NOTE — Progress Notes (Signed)
[place] [facility])                                            [place] Team                                        Statin Quality Measure Assessment    02/01/2022  Madison Jefferson Oct 14, 1950 [id number]  Per review of chart and payor information, this patient has been flagged for non-adherence to the following CMS Quality Measure:   '[x]'$  Statin Use in Persons with Diabetes  '[]'$  Statin Use in Persons with Cardiovascular Disease  The 10-year ASCVD risk score (Arnett DK, et al., 2019) is: 23.5%   Values used to calculate the score:     Age: 71 years     Sex: Female     Is Non-Hispanic African American: Yes     Diabetic: Yes     Tobacco smoker: No     Systolic Blood Pressure: 749 mmHg     Is BP treated: Yes     HDL Cholesterol: 35 mg/dL     Total Cholesterol: 137 mg/dL  Patient is failing SUPD CMS measure. 10-year ASCVD risk: 23.5%. Diabetic patient and not on statin. H/o of fatty liver disease and last LFTs were WNL. If deemed clinically appropriate, please consider discussion of statin or associated exclusion code (at the next office visit).   Next appointment with PCP: 02/02/2022  Please consider ONE of the following recommendations:   Initiate high intensity statin Atorvastatin '40mg'$  once daily, #90, 3 refills   Rosuvastatin '20mg'$  once daily, #90, 3 refills    Initiate moderate intensity          statin with reduced frequency if prior          statin intolerance 1x weekly, #13, 3 refills   2x weekly, #26, 3 refills   3x weekly, #39, 3 refills   Code for past statin intolerance or other exclusions (required annually)  Drug Induced Myopathy G72.0   Myositis, unspecified M60.9   Rhabdomyolysis M62.82   Cirrhosis of liver K74.69   Biliary cirrhosis, unspecified K74.5   Abnormal blood glucose - for SUPD ONLY R73.09   Prediabetes - for SUPD ONLY  R73.03   Polycystic ovarian syndrome E28.2   Thank you for your  time,  Kristeen Miss, [place] Cell: [phone]

## 2022-02-02 ENCOUNTER — Ambulatory Visit (INDEPENDENT_AMBULATORY_CARE_PROVIDER_SITE_OTHER): Payer: Medicare Other | Admitting: Internal Medicine

## 2022-02-02 VITALS — BP 139/49 | HR 64 | Temp 98.2°F | Ht 61.0 in | Wt 170.0 lb

## 2022-02-02 DIAGNOSIS — D696 Thrombocytopenia, unspecified: Secondary | ICD-10-CM

## 2022-02-02 DIAGNOSIS — K760 Fatty (change of) liver, not elsewhere classified: Secondary | ICD-10-CM

## 2022-02-02 DIAGNOSIS — E876 Hypokalemia: Secondary | ICD-10-CM | POA: Diagnosis not present

## 2022-02-02 DIAGNOSIS — E559 Vitamin D deficiency, unspecified: Secondary | ICD-10-CM

## 2022-02-02 DIAGNOSIS — E039 Hypothyroidism, unspecified: Secondary | ICD-10-CM | POA: Diagnosis not present

## 2022-02-02 DIAGNOSIS — I10 Essential (primary) hypertension: Secondary | ICD-10-CM | POA: Diagnosis not present

## 2022-02-02 DIAGNOSIS — E785 Hyperlipidemia, unspecified: Secondary | ICD-10-CM | POA: Diagnosis not present

## 2022-02-02 DIAGNOSIS — K317 Polyp of stomach and duodenum: Secondary | ICD-10-CM | POA: Diagnosis not present

## 2022-02-02 DIAGNOSIS — Z7984 Long term (current) use of oral hypoglycemic drugs: Secondary | ICD-10-CM | POA: Diagnosis not present

## 2022-02-02 DIAGNOSIS — E119 Type 2 diabetes mellitus without complications: Secondary | ICD-10-CM

## 2022-02-02 DIAGNOSIS — E1169 Type 2 diabetes mellitus with other specified complication: Secondary | ICD-10-CM

## 2022-02-02 DIAGNOSIS — D649 Anemia, unspecified: Secondary | ICD-10-CM

## 2022-02-02 DIAGNOSIS — Z0000 Encounter for general adult medical examination without abnormal findings: Secondary | ICD-10-CM

## 2022-02-02 HISTORY — DX: Thrombocytopenia, unspecified: D69.6

## 2022-02-02 LAB — POCT GLYCOSYLATED HEMOGLOBIN (HGB A1C): Hemoglobin A1C: 5.4 % (ref 4.0–5.6)

## 2022-02-02 LAB — GLUCOSE, CAPILLARY: Glucose-Capillary: 144 mg/dL — ABNORMAL HIGH (ref 70–99)

## 2022-02-02 MED ORDER — ROSUVASTATIN CALCIUM 20 MG PO TABS: 20.0000 mg | ORAL_TABLET | Freq: Every day | ORAL | 1 refills | Status: DC

## 2022-02-02 NOTE — Assessment & Plan Note (Signed)
BP Readings from Last 3 Encounters:  02/02/22 (!) 139/49  01/30/22 (!) 143/59  01/25/22 (!) 200/63    Lab Results  Component Value Date   NA 136 01/30/2022   K 3.2 (L) 01/30/2022   CREATININE 0.97 01/30/2022    Assessment: Blood pressure control:  Well-controlled Progress toward BP goal:   At goal Comments: Patient is compliant with olmesartan/amlodipine/HCTZ 40/10/25 mg as well as metoprolol succinate 100 mg daily  Plan: Medications:  continue current medications Educational resources provided:   Self management tools provided:   Other plans: We will check BMP today

## 2022-02-02 NOTE — Assessment & Plan Note (Signed)
-   This problem is chronic and stable -We will continue vitamin D supplementation for now -Patient states that she is compliant with this medication -We will recheck a vitamin D level today -No further work-up at this time

## 2022-02-02 NOTE — Assessment & Plan Note (Signed)
-   Patient recently admitted to the hospital with symptomatic anemia secondary to bleeding gastric polyps -She had 2 EGDs with resection of multiple gastric polyps -Path reports were consistent with hyperplastic polyps with no evidence of dysplasia or malignancy -Patient will follow-up with GI as an outpatient -No further work-up for now

## 2022-02-02 NOTE — Assessment & Plan Note (Signed)
-   Patient to get her Shingrix vaccine from her pharmacy

## 2022-02-02 NOTE — Assessment & Plan Note (Addendum)
-   Patient was recently admitted to the hospital for acute symptomatic anemia secondary to blood loss from gastric polyps -She was discharged home on Sunday and followed up with me today -Patient was found to have colitis on her CT which did not appear to be the source of her bleed.  On EGD, she was noted to have multiple bleeding gastric polyps which was a likely source of her bleed.  These were resected over the course of 2 EGDs. -Path was consistent with hyperplastic polyps -We will recheck a CBC today -Continue with iron supplementation -Recheck iron studies at next visit -No further work-up at this time

## 2022-02-02 NOTE — Assessment & Plan Note (Signed)
-   This problem is chronic and stable -Patient was noted to have low fibrosis score and did not require further work-up for NASH -We will recheck LFTs later this year -Patient has lost approximately 28 pounds since April last year -Patient states that she continue to work on her diet and exercise -No further work-up at this time

## 2022-02-02 NOTE — Assessment & Plan Note (Addendum)
Lab Results  Component Value Date   HGBA1C 5.4 02/02/2022   HGBA1C 6.0 (A) 11/04/2021   HGBA1C 10.3 (A) 06/23/2021     Assessment: Diabetes control:  Well-controlled Progress toward A1C goal:   At goal  Comments: Patient is compliant with metformin 500 mg in the morning and 1000 mg at night.  I suspect that Madison Jefferson A1c may be falsely lower than it is secondary to Madison Jefferson recent blood transfusions in the hospital  Plan: Medications:  continue current medications Home glucose monitoring: Frequency:   Timing:   Instruction/counseling given: discussed the need for weight loss Educational resources provided:   Self management tools provided:   Other plans: We will recheck Madison Jefferson A1c in 3 months.  We will check BMP today.  Patient states she saw Madison Jefferson eye doctor earlier this year but also has an appointment to see him again next month

## 2022-02-02 NOTE — Assessment & Plan Note (Signed)
-   Patient was noted to be hypokalemic on recent hospitalization with a potassium of 3.2 on discharge -She was given a day of potassium supplementation orally -We will recheck a BMP today to ensure that her potassium is now normalized -I suspect that her hypokalemia was secondary to decreased oral intake during hospitalization from her n.p.o. status

## 2022-02-02 NOTE — Progress Notes (Signed)
   Subjective:    Patient ID: Madison Jefferson, female    DOB: 1951/01/26, 71 y.o.   MRN: [id number]  HPI Seen examined this patient.  Patient is here for hospital follow-up for recent symptomatic anemia secondary to gastric polyps.  Patient states that she feels much better since her hospitalization and receiving blood.  She denies any chest pain or shortness of breath currently.  She states that she is compliant with all her medications and denies any other complaints today.   Review of Systems  Constitutional: Negative.   HENT: Negative.    Respiratory: Negative.    Cardiovascular: Negative.   Gastrointestinal: Negative.   Musculoskeletal: Negative.   Neurological: Negative.  Negative for headaches.  Psychiatric/Behavioral: Negative.         Objective:   Physical Exam Constitutional:      Appearance: Normal appearance.  HENT:     Head: Normocephalic and atraumatic.  Cardiovascular:     Rate and Rhythm: Normal rate and regular rhythm.     Heart sounds: Normal heart sounds.  Pulmonary:     Effort: Pulmonary effort is normal.     Breath sounds: Normal breath sounds. No wheezing or rales.  Abdominal:     General: Bowel sounds are normal. There is no distension.     Palpations: Abdomen is soft.     Tenderness: There is no abdominal tenderness.  Musculoskeletal:        General: No swelling or tenderness.  Neurological:     Mental Status: She is alert and oriented to person, place, and time.  Psychiatric:        Mood and Affect: Mood normal.        Behavior: Behavior normal.           Assessment & Plan:   Please see problem based charting for assessment and plan:

## 2022-02-02 NOTE — Patient Instructions (Addendum)
-  It was a pleasure seeing you today.  I am glad you are feeling better -We will check your blood counts today as well as other blood work including your kidney function, cholesterol level and vitamin D levels -Your blood pressure is doing well.  Keep up the great work! -Your weight continues to improve.  Please continue with your diet and exercise -You did have a CT scan of the chest/abdomen/pelvis as well as a colonoscopy and endoscopy while in the hospital with no evidence of an underlying tumor -Please follow-up with Dr. Katy Fitch for your eye exam -Please get a shingles vaccine from your pharmacy -I have started you on a cholesterol medication called rosuvastatin.  You can pick this up at your pharmacy -Please call me if you have any questions or concerns or if you need any refills

## 2022-02-02 NOTE — Assessment & Plan Note (Signed)
-   Patient was noted to be thrombocytopenic on recent hospitalization -Patient is platelet count at the end of hospitalization was normal in the 300s but decreased to 78 by the time she was discharged.  Her platelet count steadily decreased throughout her hospitalization. -Etiology behind her thrombocytopenia remains uncertain at this time.  It did not appear to be secondary to clumping on tech review of smear. -We will recheck a CBC today.  If her platelet count continues to decrease would consider possible hematology evaluation.

## 2022-02-02 NOTE — Assessment & Plan Note (Signed)
-   Patient was recent diagnosed with diabetes and had an elevated ASCVD risk -We will start the patient on rosuvastatin 20 mg daily -We will recheck lipid panel today -No further work-up at this time

## 2022-02-02 NOTE — Assessment & Plan Note (Signed)
-   This problem is chronic and stable -Patient with no symptoms of hypothyroidism currently -We will continue Synthroid 100 mcg daily -We will check TSH today

## 2022-02-03 LAB — CBC WITH DIFFERENTIAL/PLATELET
Basophils Absolute: 0 10*3/uL (ref 0.0–0.2)
Basos: 1 %
EOS (ABSOLUTE): 0.1 10*3/uL (ref 0.0–0.4)
Eos: 2 %
Hematocrit: 31.9 % — ABNORMAL LOW (ref 34.0–46.6)
Hemoglobin: 10.1 g/dL — ABNORMAL LOW (ref 11.1–15.9)
Immature Grans (Abs): 0 10*3/uL (ref 0.0–0.1)
Immature Granulocytes: 0 %
Lymphocytes Absolute: 1.4 10*3/uL (ref 0.7–3.1)
Lymphs: 18 %
MCH: 22.6 pg — ABNORMAL LOW (ref 26.6–33.0)
MCHC: 31.7 g/dL (ref 31.5–35.7)
MCV: 71 fL — ABNORMAL LOW (ref 79–97)
Monocytes Absolute: 0.4 10*3/uL (ref 0.1–0.9)
Monocytes: 5 %
Neutrophils Absolute: 5.6 10*3/uL (ref 1.4–7.0)
Neutrophils: 74 %
RBC: 4.47 x10E6/uL (ref 3.77–5.28)
WBC: 7.6 10*3/uL (ref 3.4–10.8)

## 2022-02-03 LAB — BMP8+ANION GAP
Anion Gap: 17 mmol/L (ref 10.0–18.0)
BUN/Creatinine Ratio: 10 — ABNORMAL LOW (ref 12–28)
BUN: 9 mg/dL (ref 8–27)
CO2: 22 mmol/L (ref 20–29)
Calcium: 9.2 mg/dL (ref 8.7–10.3)
Chloride: 100 mmol/L (ref 96–106)
Creatinine, Ser: 0.92 mg/dL (ref 0.57–1.00)
Glucose: 132 mg/dL — ABNORMAL HIGH (ref 70–99)
Potassium: 3.9 mmol/L (ref 3.5–5.2)
Sodium: 139 mmol/L (ref 134–144)
eGFR: 66 mL/min/{1.73_m2} (ref >59)

## 2022-02-03 LAB — LIPID PANEL
Chol/HDL Ratio: 3.5 ratio (ref 0.0–4.4)
Cholesterol, Total: 156 mg/dL (ref 100–199)
HDL: 44 mg/dL (ref >39)
LDL Chol Calc (NIH): 85 mg/dL (ref 0–99)
Triglycerides: 156 mg/dL — ABNORMAL HIGH (ref 0–149)
VLDL Cholesterol Cal: 27 mg/dL (ref 5–40)

## 2022-02-03 LAB — VITAMIN D 25 HYDROXY (VIT D DEFICIENCY, FRACTURES): Vit D, 25-Hydroxy: 48.3 ng/mL (ref 30.0–100.0)

## 2022-02-03 LAB — SURGICAL PATHOLOGY

## 2022-02-03 LAB — TSH: TSH: 1.61 u[IU]/mL (ref 0.450–4.500)

## 2022-02-04 ENCOUNTER — Telehealth: Payer: Self-pay | Admitting: Internal Medicine

## 2022-02-10 LAB — SURGICAL PATHOLOGY

## 2022-02-11 ENCOUNTER — Ambulatory Visit
Admission: RE | Admit: 2022-02-11 | Discharge: 2022-02-11 | Disposition: A | Discharge status: Home / Self Care | Payer: Medicare Other | Source: Ambulatory Visit | Attending: Internal Medicine | Admitting: Internal Medicine

## 2022-02-11 DIAGNOSIS — Z1231 Encounter for screening mammogram for malignant neoplasm of breast: Secondary | ICD-10-CM | POA: Diagnosis not present

## 2022-02-13 ENCOUNTER — Ambulatory Visit (HOSPITAL_COMMUNITY)
Admission: EM | Admit: 2022-02-13 | Discharge: 2022-02-13 | Disposition: A | Discharge status: Home / Self Care | Payer: Medicare Other | Attending: Physician Assistant | Admitting: Physician Assistant

## 2022-02-13 ENCOUNTER — Encounter (HOSPITAL_COMMUNITY): Payer: Self-pay | Admitting: Emergency Medicine

## 2022-02-13 DIAGNOSIS — R519 Headache, unspecified: Secondary | ICD-10-CM | POA: Diagnosis not present

## 2022-02-13 DIAGNOSIS — H538 Other visual disturbances: Secondary | ICD-10-CM

## 2022-02-13 LAB — CBC WITH DIFFERENTIAL/PLATELET
Abs Immature Granulocytes: 0.03 10*3/uL (ref 0.00–0.07)
Basophils Absolute: 0.1 10*3/uL (ref 0.0–0.1)
Basophils Relative: 1 %
Eosinophils Absolute: 0.1 10*3/uL (ref 0.0–0.5)
Eosinophils Relative: 1 %
HCT: 36.6 % (ref 36.0–46.0)
Hemoglobin: 12 g/dL (ref 12.0–15.0)
Immature Granulocytes: 0 %
Lymphocytes Relative: 19 %
Lymphs Abs: 2 10*3/uL (ref 0.7–4.0)
MCH: 24.7 pg — ABNORMAL LOW (ref 26.0–34.0)
MCHC: 32.8 g/dL (ref 30.0–36.0)
MCV: 75.3 fL — ABNORMAL LOW (ref 80.0–100.0)
Monocytes Absolute: 0.5 10*3/uL (ref 0.1–1.0)
Monocytes Relative: 5 %
Neutro Abs: 7.7 10*3/uL (ref 1.7–7.7)
Neutrophils Relative %: 74 %
Platelets: 625 10*3/uL — ABNORMAL HIGH (ref 150–400)
RBC: 4.86 MIL/uL (ref 3.87–5.11)
WBC: 10.4 10*3/uL (ref 4.0–10.5)
nRBC: 0 % (ref 0.0–0.2)

## 2022-02-13 LAB — COMPREHENSIVE METABOLIC PANEL
ALT: 27 U/L (ref 0–44)
AST: 32 U/L (ref 15–41)
Albumin: 4.3 g/dL (ref 3.5–5.0)
Alkaline Phosphatase: 39 U/L (ref 38–126)
Anion gap: 11 (ref 5–15)
BUN: 10 mg/dL (ref 8–23)
CO2: 27 mmol/L (ref 22–32)
Calcium: 9.8 mg/dL (ref 8.9–10.3)
Chloride: 102 mmol/L (ref 98–111)
Creatinine, Ser: 1.16 mg/dL — ABNORMAL HIGH (ref 0.44–1.00)
GFR, Estimated: 50 mL/min — ABNORMAL LOW (ref >60)
Glucose, Bld: 92 mg/dL (ref 70–99)
Potassium: 4.2 mmol/L (ref 3.5–5.1)
Sodium: 140 mmol/L (ref 135–145)
Total Bilirubin: 1.4 mg/dL — ABNORMAL HIGH (ref 0.3–1.2)
Total Protein: 7.5 g/dL (ref 6.5–8.1)

## 2022-02-13 LAB — C-REACTIVE PROTEIN: CRP: <0.5 mg/dL (ref <1.0)

## 2022-02-13 LAB — SEDIMENTATION RATE: Sed Rate: 14 mm/hr (ref 0–22)

## 2022-02-13 NOTE — ED Provider Notes (Signed)
[place]    CSN: [id number] Arrival date & time: 02/13/22  1348      History   Chief Complaint Chief Complaint  Patient presents with   Eye Problem    HPI Madison Jefferson is a 71 y.o. female.   Patient presents today with a 1 day history of headache.  She reports that yesterday it was significant and she took an ibuprofen and lay down.  This did provide significant relief of symptoms and her headache pain today is rated 1 on a 0-10 pain scale, localized to left temporal region, described as throbbing, no aggravating or alleviating factors identified.  She does report that her vision has been slightly blurred.  She does have a history of previous ophthalmic stroke that had a similar symptom presentation.  She has not seen her ophthalmologist recently but will see them in the next several months.  She denies any jaw claudication.  Denies history of vasculitis.  She denies history of glaucoma.  She does have a history of diabetes but her last A1c obtained 02/02/2022 was normal at 5.4% pain.  She was recently hospitalized several weeks ago for symptomatic anemia but reports that she has been doing well until development of headache.  She does have a history of migraines when she was much younger but has not had them in many years.    Past Medical History:  Diagnosis Date   Allergy    Arthritis    Asthma    Atrial fibrillation ([place])    h/o of isolated AF associated with hypothyroidism    Diverticulosis    cecum   Diverticulosis    colonoscopy 2002 [facility]   Dysphagia 06/23/2021   Fatty liver    GERD (gastroesophageal reflux disease)    Hypertension    Lumbar radiculopathy    Thyroid disease    hypothyroid   TIA (transient ischemic attack)    2006   Urinary incontinence    mixed urge and stress    Vitamin D deficiency     Patient Active Problem List   Diagnosis Date Noted   HLD (hyperlipidemia) 02/02/2022   Thrombocytopenia ([place]) 02/02/2022   Hypokalemia  01/30/2022   Gastric polyp    Symptomatic anemia 01/25/2022   Right abducens nerve palsy 11/15/2019   Adhesive capsulitis of right shoulder 10/25/2016   Osteoarthritis of left glenohumeral joint 06/13/2016   Degenerative arthritis of thumb, left 06/01/2016   Right knee pain 07/09/2014   Diabetes ([place]) 09/20/2013   Diverticulosis    Obesity, unspecified 10/12/2012   Health care maintenance 07/03/2012   Vitamin D deficiency 12/24/2009   Fatty liver 10/29/2009   Allergic rhinitis 10/21/2009   GERD 10/21/2009   Asthma 07/23/2009   Hypothyroidism 09/05/2008   HYPERTENSION, BENIGN 09/25/2007   BACK PAIN WITH RADICULOPATHY 09/25/2007    Past Surgical History:  Procedure Laterality Date   ABLATION     thyroid with radioactive iodine ablation   CARPAL TUNNEL RELEASE     left wrist   CHOLECYSTECTOMY     COLONOSCOPY WITH PROPOFOL N/A 01/28/2022   Procedure: COLONOSCOPY WITH PROPOFOL;  Surgeon: Carol Ada, MD;  Location: [place];  Service: Gastroenterology;  Laterality: N/A;   ESOPHAGOGASTRODUODENOSCOPY (EGD) WITH PROPOFOL N/A 01/28/2022   Procedure: ESOPHAGOGASTRODUODENOSCOPY (EGD) WITH PROPOFOL;  Surgeon: Carol Ada, MD;  Location: [place];  Service: Gastroenterology;  Laterality: N/A;   ESOPHAGOGASTRODUODENOSCOPY (EGD) WITH PROPOFOL N/A 01/29/2022   Procedure: ESOPHAGOGASTRODUODENOSCOPY (EGD) WITH PROPOFOL;  Surgeon: Carol Ada, MD;  Location: [facility] ENDOSCOPY;  Service: Gastroenterology;  Laterality: N/A;   HEMOSTASIS CLIP PLACEMENT  01/28/2022   Procedure: HEMOSTASIS CLIP PLACEMENT;  Surgeon: Carol Ada, MD;  Location: [facility] ENDOSCOPY;  Service: Gastroenterology;;   HEMOSTASIS CLIP PLACEMENT  01/29/2022   Procedure: HEMOSTASIS CLIP PLACEMENT;  Surgeon: Carol Ada, MD;  Location: [facility] ENDOSCOPY;  Service: Gastroenterology;;   HEMOSTASIS CONTROL  01/28/2022   Procedure: HEMOSTASIS CONTROL - EPI;  Surgeon: Carol Ada, MD;  Location: [facility] [place];  Service:  Gastroenterology;;   LEFT HEART CATHETERIZATION WITH CORONARY ANGIOGRAM N/A 01/28/2012   Procedure: LEFT HEART CATHETERIZATION WITH CORONARY ANGIOGRAM;  Surgeon: Birdie Riddle, MD;  Location: [place] CATH LAB;  Service: Cardiovascular;  Laterality: N/A;   OTHER SURGICAL HISTORY     carpel tunnel surgery    OTHER SURGICAL HISTORY     carpel tunnel repair   POLYPECTOMY  01/28/2022   Procedure: POLYPECTOMY;  Surgeon: Carol Ada, MD;  Location: [facility] ENDOSCOPY;  Service: Gastroenterology;;   POLYPECTOMY  01/29/2022   Procedure: POLYPECTOMY;  Surgeon: Carol Ada, MD;  Location: [facility] ENDOSCOPY;  Service: Gastroenterology;;   RIGHT HEART CATH  2005-Dr. Doylene Canard   minimal CAD; left circumflex dominant with 10-15% proximal lesion;; normal LVF   TUBAL LIGATION     VAGINAL HYSTERECTOMY     partial hysterectomy 1983 vaginal bleeding    OB History   No obstetric history on file.      Home Medications    Prior to Admission medications   Medication Sig Start Date End Date Taking? Authorizing Provider  Accu-Chek Softclix Lancets lancets Check blood sugar up to 1 time a day 06/30/21  Yes Aldine Contes, MD  albuterol (VENTOLIN HFA) 108 (90 Base) MCG/ACT inhaler INHALE 1 TO 2 PUFFS INTO THE LUNGS EVERY 6 HOURS AS NEEDED FOR WHEEZING OR SHORTNESS OF BREATH Patient taking differently: Inhale 1-2 puffs into the lungs every 6 (six) hours as needed for wheezing or shortness of breath. 11/04/21  Yes Aldine Contes, MD  Blood Glucose Monitoring Suppl (ACCU-CHEK GUIDE) w/Device KIT Check blood sugar up to 1 time a day 06/30/21  Yes Aldine Contes, MD  cholecalciferol (VITAMIN D3) 25 MCG (1000 UNIT) tablet Take 1 tablet (1,000 Units total) by mouth daily. 11/04/21  Yes Aldine Contes, MD  ferrous sulfate 325 (65 FE) MG tablet Take 1 tablet (325 mg total) by mouth daily. 01/30/22 05/30/22 Yes France Ravens, MD  glucose blood (ACCU-CHEK GUIDE) test strip Check blood sugar up to 1 time per day 06/30/21  Yes  Aldine Contes, MD  levothyroxine (SYNTHROID) 100 MCG tablet TAKE 1 TABLET(100 MCG) BY MOUTH DAILY Patient taking differently: Take 100 mcg by mouth daily before breakfast. 01/06/22  Yes Aldine Contes, MD  loratadine (CLARITIN) 10 MG tablet TAKE 1 TABLET(10 MG) BY MOUTH DAILY Patient taking differently: Take 10 mg by mouth daily. 09/22/21  Yes Aldine Contes, MD  metFORMIN (GLUCOPHAGE-XR) 500 MG 24 hr tablet Take 1 tablet in the morning with breakfast and 2 tablets at night with dinner Patient taking differently: Take 500-1,000 mg by mouth See admin instructions. Take 1 tablet (500 mg) in the morning with breakfast and 2 tablets (1000 mg) at night with dinner 11/04/21  Yes Aldine Contes, MD  metoprolol succinate (TOPROL-XL) 100 MG 24 hr tablet TAKE 1 TABLET BY MOUTH EVERY DAY WITH OR IMMEDIATELY FOLLOWING A MEAL 01/06/22  Yes Narendra, Nischal, MD  mometasone (NASONEX) 50 MCG/ACT nasal spray Place 2 sprays into the nose daily. 07/15/20  Yes Aldine Contes, MD  Olmesartan-amLODIPine-HCTZ 40-10-25 MG TABS TAKE 1 TABLET BY MOUTH DAILY 01/07/22  Yes Aldine Contes, MD  polyethylene glycol (MIRALAX) 17 g packet Take 17 g by mouth daily as needed for moderate constipation. 01/30/22  Yes France Ravens, MD  rosuvastatin (CRESTOR) 20 MG tablet Take 1 tablet (20 mg total) by mouth daily. 02/02/22 08/01/22 Yes Aldine Contes, MD  potassium chloride SA (KLOR-CON M) 20 MEQ tablet Take 2 tablets (40 mEq total) by mouth 2 (two) times daily for 2 doses. 01/30/22 01/31/22  France Ravens, MD    Family History Family History  Problem Relation Age of Onset   Diabetes Mother    Stroke Mother    Cancer Mother        mother <50 at diagnosis (breast); h/o oral cancer   Diabetes Father    Heart disease Father    Stroke Father    Hypertension Father    Diabetes Brother    Colon cancer Brother    Diabetes Sister    Diabetes Paternal Uncle    Cancer Brother        lung   Cancer Brother        throat    Esophageal cancer Neg Hx    Rectal cancer Neg Hx    Stomach cancer Neg Hx     Social History Social History   Tobacco Use   Smoking status: Never   Smokeless tobacco: Never  Substance Use Topics   Alcohol use: No    Alcohol/week: 0.0 standard drinks of alcohol   Drug use: No     Allergies   Oysters [shellfish allergy]   Review of Systems Review of Systems  Constitutional:  Positive for activity change. Negative for appetite change, fatigue and fever.  Eyes:  Positive for visual disturbance. Negative for photophobia.  Respiratory:  Negative for cough and shortness of breath.   Cardiovascular:  Negative for chest pain.  Gastrointestinal:  Negative for abdominal pain, diarrhea, nausea and vomiting.  Neurological:  Positive for headaches. Negative for dizziness, syncope, speech difficulty, light-headedness and numbness.     Physical Exam Triage Vital Signs ED Triage Vitals  Enc Vitals Group     BP 02/13/22 1522 (!) 166/71     Pulse Rate 02/13/22 1522 63     Resp 02/13/22 1522 18     Temp 02/13/22 1522 98.6 F (37 C)     Temp Source 02/13/22 1522 Oral     SpO2 02/13/22 1522 96 %     Weight 02/13/22 1524 162 lb (73.5 kg)     Height 02/13/22 1524 5' 1"  (1.549 m)     Head Circumference --      Peak Flow --      Pain Score 02/13/22 1523 0     Pain Loc --      Pain Edu? --      Excl. in [place]? --    No data found.  Updated Vital Signs BP (!) 156/72 (BP Location: Left Arm)   Pulse 68   Temp 98.5 F (36.9 C) (Oral)   Resp 18   Ht 5' 1"  (1.549 m)   Wt 162 lb (73.5 kg)   SpO2 97%   BMI 30.61 kg/m   Visual Acuity Right Eye Distance: 20 25 Left Eye Distance: 20 50 Bilateral Distance: 20 25  Right Eye Near:   Left Eye Near:    Bilateral Near:     Physical Exam Vitals reviewed.  Constitutional:      General: She is  awake. She is not in acute distress.    Appearance: Normal appearance. She is well-developed. She is not ill-appearing.     Comments: Very  pleasant female appears stated age in no acute distress sitting comfortably in exam room  HENT:     Head: Normocephalic and atraumatic. No raccoon eyes, Battle's sign or contusion.     Jaw: No tenderness or pain on movement.     Right Ear: Tympanic membrane, ear canal and external ear normal. No hemotympanum.     Left Ear: Tympanic membrane, ear canal and external ear normal. No hemotympanum.     Mouth/Throat:     Tongue: Tongue does not deviate from midline.     Pharynx: Uvula midline. No oropharyngeal exudate or posterior oropharyngeal erythema.  Eyes:     Extraocular Movements: Extraocular movements intact.     Conjunctiva/sclera: Conjunctivae normal.     Pupils: Pupils are equal, round, and reactive to light.  Cardiovascular:     Rate and Rhythm: Normal rate and regular rhythm.     Heart sounds: Normal heart sounds, S1 normal and S2 normal. No murmur heard. Pulmonary:     Effort: Pulmonary effort is normal.     Breath sounds: Normal breath sounds. No wheezing, rhonchi or rales.     Comments: Clear to auscultation bilaterally Abdominal:     General: Bowel sounds are normal.     Palpations: Abdomen is soft.     Tenderness: There is no abdominal tenderness.  Musculoskeletal:     Cervical back: No spinous process tenderness or muscular tenderness.     Comments: Strength 5/5 bilateral upper and lower extremities  Lymphadenopathy:     Head:     Right side of head: No submental, submandibular or tonsillar adenopathy.     Left side of head: No submental, submandibular or tonsillar adenopathy.  Neurological:     General: No focal deficit present.     Motor: Motor function is intact.     Coordination: Coordination is intact.     Gait: Gait is intact.  Psychiatric:        Behavior: Behavior is cooperative.      UC Treatments / Results  Labs (all labs ordered are listed, but only abnormal results are displayed) Labs Reviewed  SEDIMENTATION RATE  C-REACTIVE PROTEIN  CBC WITH  DIFFERENTIAL/PLATELET  COMPREHENSIVE METABOLIC PANEL    EKG   Radiology No results found.  Procedures Procedures (including critical care time)  Medications Ordered in UC Medications - No data to display  Initial Impression / Assessment and Plan / UC Course  I have reviewed the triage vital signs and the nursing notes.  Pertinent labs & imaging results that were available during my care of the patient were reviewed by me and considered in my medical decision making (see chart for details).     Discussed with patient potential utility of going to the emergency room for further evaluation and management.  She reports that her visual disturbance noted today is chronic related to previous ocular strokes and her headache has improved.  We did discuss that potentially that her symptoms could be caused by giant cell arteritis CBC, CMP, ESR, CRP were obtained today.  We will contact her tomorrow if her lab work is abnormal to arrange initiation of prednisone and vascular surgery referral.  She was encouraged to drink plenty of fluid and use Tylenol for pain.  We discussed at length that if she has any worsening symptoms including worsening vision, headache, nausea/vomiting,  weakness, facial asymmetry she is to go directly to the emergency room for further evaluation and management.  Assuming no worsening symptoms and normal work-up she is to follow-up with ophthalmology first thing next week to which she expressed understanding.  Final Clinical Impressions(s) / UC Diagnoses   Final diagnoses:  Blurred vision, left eye  Left temporal headache     Discharge Instructions      I will contact you tomorrow with your lab work if we need to make any adjustments.  I am glad that you are feeling better.  Continue Tylenol for pain relief.  If you develop any worsening symptoms including visual change, worsening headache, weakness, nausea, vomiting you need to go to the emergency room as we  discussed.  Please follow-up with your ophthalmologist first thing next week.     ED Prescriptions   None    PDMP not reviewed this encounter.   Terrilee Croak, PA-C 02/13/22 1644

## 2022-02-13 NOTE — ED Notes (Signed)
Provider and RN aware of patient's complaint.

## 2022-02-13 NOTE — Discharge Instructions (Addendum)
I will contact you tomorrow with your lab work if we need to make any adjustments.  I am glad that you are feeling better.  Continue Tylenol for pain relief.  If you develop any worsening symptoms including visual change, worsening headache, weakness, nausea, vomiting you need to go to the emergency room as we discussed.  Please follow-up with your ophthalmologist first thing next week.

## 2022-02-13 NOTE — ED Triage Notes (Signed)
Patient states that she had a headache yesterday, took an Ibuprofen and laid down.  This morning she noticed swelling under her left eye.  Patient does have a history of strokes in both eyes.  Patient having blurred vision.

## 2022-02-18 ENCOUNTER — Emergency Department (HOSPITAL_COMMUNITY)
Admission: EM | Admit: 2022-02-18 | Discharge: 2022-02-18 | Disposition: A | Discharge status: Home / Self Care | Payer: Medicare Other | Attending: Emergency Medicine | Admitting: Emergency Medicine

## 2022-02-18 ENCOUNTER — Other Ambulatory Visit: Payer: Self-pay

## 2022-02-18 ENCOUNTER — Emergency Department (HOSPITAL_COMMUNITY): Payer: Medicare Other

## 2022-02-18 ENCOUNTER — Encounter (HOSPITAL_COMMUNITY): Payer: Self-pay

## 2022-02-18 DIAGNOSIS — R791 Abnormal coagulation profile: Secondary | ICD-10-CM | POA: Insufficient documentation

## 2022-02-18 DIAGNOSIS — Z7984 Long term (current) use of oral hypoglycemic drugs: Secondary | ICD-10-CM | POA: Insufficient documentation

## 2022-02-18 DIAGNOSIS — R29818 Other symptoms and signs involving the nervous system: Secondary | ICD-10-CM | POA: Diagnosis not present

## 2022-02-18 DIAGNOSIS — E119 Type 2 diabetes mellitus without complications: Secondary | ICD-10-CM | POA: Insufficient documentation

## 2022-02-18 DIAGNOSIS — H532 Diplopia: Secondary | ICD-10-CM | POA: Diagnosis not present

## 2022-02-18 DIAGNOSIS — J45909 Unspecified asthma, uncomplicated: Secondary | ICD-10-CM | POA: Insufficient documentation

## 2022-02-18 DIAGNOSIS — I6523 Occlusion and stenosis of bilateral carotid arteries: Secondary | ICD-10-CM | POA: Diagnosis not present

## 2022-02-18 DIAGNOSIS — H538 Other visual disturbances: Secondary | ICD-10-CM | POA: Diagnosis not present

## 2022-02-18 DIAGNOSIS — R519 Headache, unspecified: Secondary | ICD-10-CM | POA: Diagnosis not present

## 2022-02-18 DIAGNOSIS — I10 Essential (primary) hypertension: Secondary | ICD-10-CM | POA: Insufficient documentation

## 2022-02-18 DIAGNOSIS — E039 Hypothyroidism, unspecified: Secondary | ICD-10-CM | POA: Diagnosis not present

## 2022-02-18 DIAGNOSIS — I773 Arterial fibromuscular dysplasia: Secondary | ICD-10-CM | POA: Diagnosis not present

## 2022-02-18 DIAGNOSIS — Z79899 Other long term (current) drug therapy: Secondary | ICD-10-CM | POA: Diagnosis not present

## 2022-02-18 DIAGNOSIS — H4912 Fourth [trochlear] nerve palsy, left eye: Secondary | ICD-10-CM | POA: Insufficient documentation

## 2022-02-18 LAB — CBC WITH DIFFERENTIAL/PLATELET
Abs Immature Granulocytes: 0.02 10*3/uL (ref 0.00–0.07)
Basophils Absolute: 0.1 10*3/uL (ref 0.0–0.1)
Basophils Relative: 1 %
Eosinophils Absolute: 0.1 10*3/uL (ref 0.0–0.5)
Eosinophils Relative: 1 %
HCT: 36.6 % (ref 36.0–46.0)
Hemoglobin: 11.7 g/dL — ABNORMAL LOW (ref 12.0–15.0)
Immature Granulocytes: 0 %
Lymphocytes Relative: 18 %
Lymphs Abs: 1.7 10*3/uL (ref 0.7–4.0)
MCH: 23.9 pg — ABNORMAL LOW (ref 26.0–34.0)
MCHC: 32 g/dL (ref 30.0–36.0)
MCV: 74.7 fL — ABNORMAL LOW (ref 80.0–100.0)
Monocytes Absolute: 0.5 10*3/uL (ref 0.1–1.0)
Monocytes Relative: 5 %
Neutro Abs: 6.7 10*3/uL (ref 1.7–7.7)
Neutrophils Relative %: 75 %
Platelets: 352 10*3/uL (ref 150–400)
RBC: 4.9 MIL/uL (ref 3.87–5.11)
Smear Review: NORMAL
WBC: 9 10*3/uL (ref 4.0–10.5)
nRBC: 0 % (ref 0.0–0.2)

## 2022-02-18 LAB — COMPREHENSIVE METABOLIC PANEL
ALT: 20 U/L (ref 0–44)
AST: 22 U/L (ref 15–41)
Albumin: 4.1 g/dL (ref 3.5–5.0)
Alkaline Phosphatase: 39 U/L (ref 38–126)
Anion gap: 12 (ref 5–15)
BUN: 12 mg/dL (ref 8–23)
CO2: 28 mmol/L (ref 22–32)
Calcium: 9.5 mg/dL (ref 8.9–10.3)
Chloride: 102 mmol/L (ref 98–111)
Creatinine, Ser: 1.06 mg/dL — ABNORMAL HIGH (ref 0.44–1.00)
GFR, Estimated: 55 mL/min — ABNORMAL LOW (ref >60)
Glucose, Bld: 122 mg/dL — ABNORMAL HIGH (ref 70–99)
Potassium: 3.5 mmol/L (ref 3.5–5.1)
Sodium: 142 mmol/L (ref 135–145)
Total Bilirubin: 1.7 mg/dL — ABNORMAL HIGH (ref 0.3–1.2)
Total Protein: 7.5 g/dL (ref 6.5–8.1)

## 2022-02-18 LAB — CBG MONITORING, ED: Glucose-Capillary: 111 mg/dL — ABNORMAL HIGH (ref 70–99)

## 2022-02-18 LAB — SEDIMENTATION RATE: Sed Rate: 15 mm/hr (ref 0–22)

## 2022-02-18 LAB — C-REACTIVE PROTEIN: CRP: 0.5 mg/dL (ref <1.0)

## 2022-02-18 LAB — PROTIME-INR
INR: 1.1 (ref 0.8–1.2)
Prothrombin Time: 13.7 seconds (ref 11.4–15.2)

## 2022-02-18 LAB — APTT: aPTT: 33 seconds (ref 24–36)

## 2022-02-18 MED ORDER — GADOBUTROL 1 MMOL/ML IV SOLN
7.5000 mL | Freq: Once | INTRAVENOUS | Status: AC | PRN
Start: 2022-02-18 — End: 2022-02-18
  Administered 2022-02-18: 7.5 mL via INTRAVENOUS

## 2022-02-18 MED ORDER — GADOBUTROL 1 MMOL/ML IV SOLN
7.5000 mL | Freq: Once | INTRAVENOUS | Status: AC | PRN
  Administered 2022-02-18: 7.5 mL via INTRAVENOUS

## 2022-02-18 MED ORDER — ASPIRIN 81 MG PO CHEW: 81.0000 mg | CHEWABLE_TABLET | Freq: Every day | ORAL | 0 refills | Status: AC

## 2022-02-18 MED ORDER — IOHEXOL 350 MG/ML SOLN
75.0000 mL | Freq: Once | INTRAVENOUS | Status: AC | PRN
  Administered 2022-02-18: 75 mL via INTRAVENOUS

## 2022-02-18 NOTE — ED Provider Triage Note (Signed)
Emergency Medicine Provider Triage Evaluation Note  Madison Jefferson , a 71 y.o. female  was evaluated in triage.  Pt complains of 1 week history of new onset headache.  On 02/13/2022 started experiencing double vision and blurry vision in her left eye.  This symptoms have persisted since then.  She was seen at her ophthalmologist office today and was sent to the ER for further work-up including ruling out mass versus bleed versus temporal arteritis versus vasculitis.  She denies any injury or trauma.  No anticoagulant use.  Denies any numbness or weakness in extremities.  No changes to gait.  Has been taking Tylenol for her headache and this has somewhat improved it but no recent use of Tylenol.  Review of Systems  Positive: Headache, blurry vision in left eye Negative: Fever, numbness  Physical Exam  BP (!) 147/71   Pulse 71   Temp 98.7 F (37.1 C) (Oral)   Resp 16   SpO2 99%  Gen:   Awake, no distress   Resp:  Normal effort  MSK:   Moves extremities without difficulty  Other:  Strength 5/5 in bilateral upper and lower extremities.  Drooping of left eyelid noted.  Pupils equal and reactive to light bilaterally.  Normal speech.  Normal sensation to light touch of bilateral upper and lower extremities, face  Medical Decision Making  Medically screening exam initiated at 12:58 PM.  Appropriate orders placed.  Madison Jefferson was informed that the remainder of the evaluation will be completed by another provider, this initial triage assessment does not replace that evaluation, and the importance of remaining in the ED until their evaluation is complete.  I have placed appropriate orders based on ophthalmology note including MRI of the brain with and without contrast, MRI of the orbits with and without contrast, ESR and CRP as well as other labs and imaging based on my assessment. Informed charge nurse that patient needs to be roomed next.   Delia Heady, PA-C 02/18/22 1304

## 2022-02-18 NOTE — ED Provider Notes (Signed)
[facility]ERGENC[facility] DEPARTMENT Provider Note   CSN: [id number] Arrival date & time: 02/18/22  1155     History  Chief Complaint  Patient presents with   Headache   Blurred Vision    Madison Jefferson is a 71 y.o. female presenting today from ophthalmology office with concern for possible stroke.  Bad headache started on Thursday last week, tried ibuprofen and Tylenol over 2 days without improvement.  Saturday morning she noted blurred and double vision and left-sided facial numbness.  Went to her eye doctor, Dr. Katy Fitch, and was recommended to come to the ED for further evaluation.  Vision changes remain, left-sided facial numbness has significantly reduced as of this morning.  With tenderness surrounding the left eye, going towards the temple.  Denies fever, neck stiffness or pain, chest pain, shortness of breath, tingling or numbness of the extremities.  The history is provided by the patient and medical records.  Headache      Home Medications Prior to Admission medications   Medication Sig Start Date End Date Taking? Authorizing Provider  Accu-Chek Softclix Lancets lancets Check blood sugar up to 1 time a day 06/30/21   Aldine Contes, MD  albuterol (VENTOLIN HFA) 108 (90 Base) MCG/ACT inhaler INHALE 1 TO 2 PUFFS INTO THE LUNGS EVERY 6 HOURS AS NEEDED FOR WHEEZING OR SHORTNESS OF BREATH Patient taking differently: Inhale 1-2 puffs into the lungs every 6 (six) hours as needed for wheezing or shortness of breath. 11/04/21   Aldine Contes, MD  Blood Glucose Monitoring Suppl (ACCU-CHEK GUIDE) w/Device KIT Check blood sugar up to 1 time a day 06/30/21   Aldine Contes, MD  cholecalciferol (VITAMIN D3) 25 MCG (1000 UNIT) tablet Take 1 tablet (1,000 Units total) by mouth daily. 11/04/21   Aldine Contes, MD  ferrous sulfate 325 (65 FE) MG tablet Take 1 tablet (325 mg total) by mouth daily. 01/30/22 05/30/22  France Ravens, MD  glucose blood (ACCU-CHEK GUIDE) test  strip Check blood sugar up to 1 time per day 06/30/21   Aldine Contes, MD  levothyroxine (SYNTHROID) 100 MCG tablet TAKE 1 TABLET(100 MCG) BY MOUTH DAILY Patient taking differently: Take 100 mcg by mouth daily before breakfast. 01/06/22   Aldine Contes, MD  loratadine (CLARITIN) 10 MG tablet TAKE 1 TABLET(10 MG) BY MOUTH DAILY Patient taking differently: Take 10 mg by mouth daily. 09/22/21   Aldine Contes, MD  metFORMIN (GLUCOPHAGE-XR) 500 MG 24 hr tablet Take 1 tablet in the morning with breakfast and 2 tablets at night with dinner Patient taking differently: Take 500-1,000 mg by mouth See admin instructions. Take 1 tablet (500 mg) in the morning with breakfast and 2 tablets (1000 mg) at night with dinner 11/04/21   Aldine Contes, MD  metoprolol succinate (TOPROL-XL) 100 MG 24 hr tablet TAKE 1 TABLET BY MOUTH EVERY DAY WITH OR IMMEDIATELY FOLLOWING A MEAL 01/06/22   Aldine Contes, MD  mometasone (NASONEX) 50 MCG/ACT nasal spray Place 2 sprays into the nose daily. 07/15/20   Aldine Contes, MD  Olmesartan-amLODIPine-HCTZ 40-10-25 MG TABS TAKE 1 TABLET BY MOUTH DAILY 01/07/22   Aldine Contes, MD  polyethylene glycol (MIRALAX) 17 g packet Take 17 g by mouth daily as needed for moderate constipation. 01/30/22   France Ravens, MD  potassium chloride SA (KLOR-CON M) 20 MEQ tablet Take 2 tablets (40 mEq total) by mouth 2 (two) times daily for 2 doses. 01/30/22 01/31/22  France Ravens, MD  rosuvastatin (CRESTOR) 20 MG tablet Take 1 tablet (  20 mg total) by mouth daily. 02/02/22 08/01/22  Aldine Contes, MD      Allergies    Jeanie Cooks allergy]    Review of Systems   Review of Systems  Eyes:  Positive for visual disturbance.  Neurological:  Positive for headaches.    Physical Exam Updated Vital Signs BP 139/61   Pulse 71   Temp 98.7 F (37.1 C) (Oral)   Resp 16   Ht _0  (1.549 m)   Wt 73 kg   SpO2 98%   BMI 30.42 kg/m  Physical Exam Vitals and nursing note  reviewed.  Constitutional:      General: She is not in acute distress.    Appearance: She is well-developed. She is not ill-appearing, toxic-appearing or diaphoretic.  HENT:     Head: Normocephalic and atraumatic.     Mouth/Throat:     Pharynx: Oropharynx is clear.  Eyes:     General: Lids are normal. Vision grossly intact. Gaze aligned appropriately. No visual field deficit.    Conjunctiva/sclera: Conjunctivae normal.     Pupils: Pupils are equal, round, and reactive to light.     Visual Fields: Right eye visual fields normal and left eye visual fields normal.     Right eye: CF in the upper temporal quadrant. CF in the upper nasal quadrant. CF in the lower temporal quadrant. CF in the lower nasal quadrant.     Left eye: CF in the upper nasal quadrant. CF in the upper temporal quadrant. CF in the lower nasal quadrant. CF in the lower temporal quadrant.     Comments: Left eye appears deviated laterally and superiorly at rest.  EOMs appear grossly intact, which they are able to move in all directions appropriately, just somewhat asynchronous.  Subjective blurred vision when both eyes are open, but not with each eye individually.    Neck:     Comments: Very supple on exam Cardiovascular:     Rate and Rhythm: Normal rate and regular rhythm.     Heart sounds: Normal heart sounds. No murmur heard. Pulmonary:     Effort: Pulmonary effort is normal. No respiratory distress.     Breath sounds: Normal breath sounds.  Abdominal:     General: There is no distension.     Palpations: Abdomen is soft.     Tenderness: There is no abdominal tenderness.  Musculoskeletal:        General: No swelling.     Cervical back: Neck supple. No rigidity.  Skin:    General: Skin is warm and dry.     Capillary Refill: Capillary refill takes less than 2 seconds.     Coloration: Skin is not cyanotic or pale.  Neurological:     Mental Status: She is alert and oriented to person, place, and time.     GCS: GCS eye  subscore is 4. GCS verbal subscore is 5. GCS motor subscore is 6.     Cranial Nerves: Cranial nerve deficit present. No dysarthria or facial asymmetry.     Sensory: No sensory deficit.     Motor: No weakness, tremor, seizure activity or pronator drift.     Coordination: Coordination normal. Finger-Nose-Finger Test and Heel to [facility] Test normal.     Comments: Facial muscle movements symmetrical.  V1, V2, V3 intact and symmetric.  Coordination, gait, sensation, and ROM/strength appears grossly intact.  Psychiatric:        Mood and Affect: Mood normal.     ED Results /  Procedures / Treatments   Labs (all labs ordered are listed, but only abnormal results are displayed) Labs Reviewed  COMPREHENSIVE METABOLIC PANEL - Abnormal; Notable for the following components:      Result Value   Glucose, Bld 122 (*)    Creatinine, Ser 1.06 (*)    Total Bilirubin 1.7 (*)    GFR, Estimated 55 (*)    All other components within normal limits  CBC WITH DIFFERENTIAL/PLATELET - Abnormal; Notable for the following components:   Hemoglobin 11.7 (*)    MCV 74.7 (*)    MCH 23.9 (*)    All other components within normal limits  CBG MONITORING, ED - Abnormal; Notable for the following components:   Glucose-Capillary 111 (*)    All other components within normal limits  PROTIME-INR  APTT  SEDIMENTATION RATE  C-REACTIVE PROTEIN    EKG None  Radiology MR Brain W and Wo Contrast  Result Date: 02/18/2022 CLINICAL DATA:  Diplopia. EXAM: MRI HEAD AND ORBITS WITHOUT AND WITH CONTRAST TECHNIQUE: Multiplanar, multiecho pulse sequences of the brain and surrounding structures were obtained without and with intravenous contrast. Multiplanar, multiecho pulse sequences of the orbits and surrounding structures were obtained including fat saturation techniques, before and after intravenous contrast administration. CONTRAST:  7.34m GADAVIST GADOBUTROL 1 MMOL/ML IV SOLN COMPARISON:  CT head 02/18/2022 FINDINGS: MRI HEAD  FINDINGS Brain: No acute infarction, hemorrhage, hydrocephalus, extra-axial collection or mass lesion. Mild white matter changes with few small deep white matter hyperintensities bilaterally. Brainstem and cerebellum normal. Normal enhancement. Vascular: Normal arterial flow voids. Skull and upper cervical spine: No focal skeletal lesion. Other: None MRI ORBITS FINDINGS Orbits:  Bilateral cataract extraction.  Globe otherwise normal. Optic nerve normal bilaterally. No signal abnormality, mass, or abnormal enhancement. Extraocular muscles normal. Orbital fat normal without edema. Visualized sinuses: Mild mucosal edema ethmoid sinuses bilaterally. Mild mastoid effusion bilaterally. No air-fluid level. Soft tissues: No soft tissue swelling. IMPRESSION: 1. No acute intracranial abnormality. Mild white matter changes likely due to chronic microvascular ischemia 2. Negative orbit bilaterally. Electronically Signed   By: CFranchot GalloM.D.   On: 02/18/2022 17:31   MR ORBITS W WO CONTRAST  Result Date: 02/18/2022 CLINICAL DATA:  Diplopia. EXAM: MRI HEAD AND ORBITS WITHOUT AND WITH CONTRAST TECHNIQUE: Multiplanar, multiecho pulse sequences of the brain and surrounding structures were obtained without and with intravenous contrast. Multiplanar, multiecho pulse sequences of the orbits and surrounding structures were obtained including fat saturation techniques, before and after intravenous contrast administration. CONTRAST:  7.52mGADAVIST GADOBUTROL 1 MMOL/ML IV SOLN COMPARISON:  CT head 02/18/2022 FINDINGS: MRI HEAD FINDINGS Brain: No acute infarction, hemorrhage, hydrocephalus, extra-axial collection or mass lesion. Mild white matter changes with few small deep white matter hyperintensities bilaterally. Brainstem and cerebellum normal. Normal enhancement. Vascular: Normal arterial flow voids. Skull and upper cervical spine: No focal skeletal lesion. Other: None MRI ORBITS FINDINGS Orbits:  Bilateral cataract extraction.   Globe otherwise normal. Optic nerve normal bilaterally. No signal abnormality, mass, or abnormal enhancement. Extraocular muscles normal. Orbital fat normal without edema. Visualized sinuses: Mild mucosal edema ethmoid sinuses bilaterally. Mild mastoid effusion bilaterally. No air-fluid level. Soft tissues: No soft tissue swelling. IMPRESSION: 1. No acute intracranial abnormality. Mild white matter changes likely due to chronic microvascular ischemia 2. Negative orbit bilaterally. Electronically Signed   By: ChFranchot Gallo.D.   On: 02/18/2022 17:31   CT HEAD WO CONTRAST (5MM)  Result Date: 02/18/2022 CLINICAL DATA:  Headache, new or worsening.  Blurred vision.  EXAM: CT HEAD WITHOUT CONTRAST TECHNIQUE: Contiguous axial images were obtained from the base of the skull through the vertex without intravenous contrast. RADIATION DOSE REDUCTION: This exam was performed according to the departmental dose-optimization program which includes automated exposure control, adjustment of the mA and/or kV according to patient size and/or use of iterative reconstruction technique. COMPARISON:  CT examination dated April 07, 2021 FINDINGS: Brain: No evidence of acute infarction, hemorrhage, hydrocephalus, extra-axial collection or mass lesion/mass effect. Mild chronic microvascular ischemic changes of the white matter. Vascular: No hyperdense vessel or unexpected calcification. Skull: Normal. Negative for fracture or focal lesion. Sinuses/Orbits: No acute finding. Other: None. IMPRESSION: No acute intracranial abnormality. Electronically Signed   By: Keane Police D.O.   On: 02/18/2022 14:53    Procedures Procedures    Medications Ordered in ED Medications  gadobutrol (GADAVIST) 1 MMOL/ML injection 7.5 mL (7.5 mLs Intravenous Contrast Given 02/18/22 1713)  gadobutrol (GADAVIST) 1 MMOL/ML injection 7.5 mL (7.5 mLs Intravenous Contrast Given 02/18/22 1726)  iohexol (OMNIPAQUE) 350 MG/ML injection 75 mL (75 mLs Intravenous  Contrast Given 02/18/22 1805)    ED Course/ Medical Decision Making/ A&P                           Medical Decision Making Amount and/or Complexity of Data Reviewed Radiology: ordered.  Risk Prescription drug management.   71 y.o. female presents to the ED for concern of Headache and Blurred Vision     This involves an extensive number of treatment options, and is a complaint that carries with it a high risk of complications and morbidity.  The emergent differential diagnosis prior to evaluation includes, but is not limited to: CVA, vasculopathic (diabetes mellitus, HTN), mass, ICH  This is not an exhaustive differential.   Past Medical History / Co-morbidities / Social History: Hx of HTN, hypothyroidism, GERD, asthma, resolved 3rd nerve palsy OD, resolved 6th nerve palsy OS. Social Determinants of Health include: Elderly  Additional History:  Internal and external records from outside source obtained and reviewed including ophthalmology results from Dr. Zenia Resides office, referenced below.        Lab Tests: I ordered, and personally interpreted labs.  The pertinent results include:   CBC: CMP/BMP: Mild hyperglycemia 122, creatinine 1.06 CRP: 0.5 ESR:15 APTT: 33 PT/INR: 13.7 and 1.1 CBG 111  Imaging Studies: I ordered imaging studies including CT head, CTA head and neck, MRI brain and orbits WWO.   I independently visualized and interpreted imaging which showed: CT head: Negative for acute intracranial abnormality CTA head and neck: Pending MRI brain and orbits WWO: 1. No acute intracranial abnormality. Mild white matter changes likely due to chronic microvascular ischemia 2. Negative orbit bilaterally. I agree with the radiologist interpretation.  ED Course / Critical Interventions: Pt well-appearing on exam.  Sitting comfortably.  Nontoxic, nonseptic appearing in NAD.  AAOx4.  Sent from ophthalmologist for further evaluation.  Patient with headache for the last week,  and blurred vision/diplopia for the last 5 days.  Slightly improved today than the past few days.  Had temporary left sided facial numbness that resolved within the last 24 hours as well.  No other complaints.  Neurological exam indicated mild superior and lateral deviation of the left eye at rest.  Subjective blurred vision with both eyes open, resolves when only using 1 eye at a time.  Believe this is likely due to positional changes of the left eye.  No facial asymmetry, extremity  weakness, pronator drift, abnormal coordination, abnormal gait, or other neurological findings.  After physical exam findings and reviewing ophthalmologist notes/exams performed today, low suspicion for iritis, uveitis, conjunctivitis, glaucoma.  He was concerned for possible CVA versus mass versus vasculopathic versus ICH.  Also requested further imaging and ESR/CRP testing.  Patient without true temporal tenderness on exam, ESR and CRP negative, low suspicion for giant cell arteritis at this time.  Hx of multiple nerve palsies that affected the eyes bilaterally at one time or another, that each resolved on their own.  Believed to be due to hypertension and/or diabetes.  Plan to proceed with imaging to further assess. CT imaging negative for acute intracranial pathology.  Low suspicion for [facility], ICH.  No meningismus or fever, does not appear delirious, low suspicion for meningitis.  Upon reevaluation, patient's status is unchanged.  Plan to continue with CTA head and neck and MRI of brain and orbits. MRI brain and orbits without evidence of CVA or mass.  Upon reevaluation, again patient status unchanged.  CTA head and neck pending. I have reviewed the patients home medicines and have made adjustments as needed.  Disposition: 1915  care of ILEAH FALKENSTEIN transferred to Dr. Billy Fischer at the end of my shift.  Patient case discussed at length.  Please see his/her note for further details.  Plan at time of handoff is dependent on CTA  head and neck.  If negative for acute vascular etiology, likely continue with close outpatient follow up.  If positive, possible admission with appropriate following specialists.  May consider neurology consultation as well.  This may be altered or completely changed at the discretion of the oncoming team pending results of further workup.  I discussed this case with my attending, Dr. Ronnald Nian, who agreed with the proposed treatment course and cosigned this note including patient's presenting symptoms, physical exam, and planned diagnostics and interventions.  Attending physician stated agreement with plan or made changes to plan which were implemented.     This chart was dictated using voice recognition software.  Despite best efforts to proofread, errors can occur which can change the documentation meaning.         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

## 2022-02-18 NOTE — ED Triage Notes (Signed)
Pt arrived POV from the eye doctor for a headache and blurred vision that started last Thursday. Per the eye doctor they stated she has a left 4th nerve palsy.

## 2022-02-18 NOTE — ED Notes (Signed)
All discharge instructions including follow up care and prescriptions reviewed with patient and patient verbalized understanding of same. Patient stable at time of discharge and was provided with a wheel chair prior to leaving department.

## 2022-02-18 NOTE — ED Notes (Signed)
Patient transported to MRI 

## 2022-02-18 NOTE — ED Provider Notes (Signed)
Patient sent here from ophthalmologist office.  This is a shared patient visit with my PA.  She had headache last week.  That has resolved but the next day she started to develop some double vision in her left eye.  She saw her ophthalmologist today and noticed that she had a 4th nerve palsy on the left.  She has had a history of nerve palsies on the right that were thought to be from microvascular issues per ophthalmology note.  Those have resolved.  She appears to have upward and lateral gaze in the left eye.  Otherwise neurologically she appears to be intact.  She has no current headache.  She does feel little bit tender around the eye.  Ophthalmology note shows normal intraocular pressure and overall the note is recommending for MRIs of the brain and orbit to evaluate for mass versus aneurysm versus MS versus bleed.  Patient has history of hypertension, TIA, A-fib history.  Patient not on blood thinners.  Overall we will proceed with stroke work-up/brain imaging.  We will touch base with neurology once his images are back.  Possible that this could be microvascular process as well but will rule out acute neurological processes in the meantime.  Patient awaiting MRI images at time of handoff to oncoming ED staff.  This chart was dictated using voice recognition software.  Despite best efforts to proofread,  errors can occur which can change the documentation meaning.    Lennice Sites, DO 02/18/22 1536

## 2022-03-02 DIAGNOSIS — H4912 Fourth [trochlear] nerve palsy, left eye: Secondary | ICD-10-CM | POA: Diagnosis not present

## 2022-03-02 DIAGNOSIS — H04123 Dry eye syndrome of bilateral lacrimal glands: Secondary | ICD-10-CM | POA: Diagnosis not present

## 2022-03-02 DIAGNOSIS — H0288B Meibomian gland dysfunction left eye, upper and lower eyelids: Secondary | ICD-10-CM | POA: Diagnosis not present

## 2022-03-02 DIAGNOSIS — H0288A Meibomian gland dysfunction right eye, upper and lower eyelids: Secondary | ICD-10-CM | POA: Diagnosis not present

## 2022-03-02 DIAGNOSIS — Z961 Presence of intraocular lens: Secondary | ICD-10-CM | POA: Diagnosis not present

## 2022-03-02 LAB — HM DIABETES EYE EXAM

## 2022-03-03 DIAGNOSIS — K317 Polyp of stomach and duodenum: Secondary | ICD-10-CM | POA: Diagnosis not present

## 2022-03-03 DIAGNOSIS — D509 Iron deficiency anemia, unspecified: Secondary | ICD-10-CM | POA: Diagnosis not present

## 2022-03-09 ENCOUNTER — Encounter: Payer: Self-pay | Admitting: Dietician

## 2022-03-18 ENCOUNTER — Encounter: Payer: Self-pay | Admitting: Internal Medicine

## 2022-03-18 DIAGNOSIS — K317 Polyp of stomach and duodenum: Secondary | ICD-10-CM | POA: Diagnosis not present

## 2022-03-18 DIAGNOSIS — D509 Iron deficiency anemia, unspecified: Secondary | ICD-10-CM | POA: Diagnosis not present

## 2022-03-18 DIAGNOSIS — K5090 Crohn's disease, unspecified, without complications: Secondary | ICD-10-CM | POA: Diagnosis not present

## 2022-03-18 DIAGNOSIS — K635 Polyp of colon: Secondary | ICD-10-CM | POA: Diagnosis not present

## 2022-04-02 ENCOUNTER — Telehealth: Payer: Self-pay | Admitting: *Deleted

## 2022-04-02 NOTE — Telephone Encounter (Signed)
Received fax from [facility] requesting refill on Aspirin 81 mg EC. This has expired off med list.

## 2022-04-06 MED ORDER — ASPIRIN 81 MG PO TBEC: 81.0000 mg | DELAYED_RELEASE_TABLET | Freq: Every day | ORAL | 2 refills | Status: DC

## 2022-04-06 NOTE — Telephone Encounter (Signed)
Received 2nd fax from [facility] requesting a refill on ASA 81 mg. Thanks

## 2022-04-13 NOTE — Progress Notes (Deleted)
NEUROLOGY FOLLOW UP OFFICE NOTE  Madison Jefferson [id number]  Assessment/Plan:   71 year old female with istory of ischemic 6th nerve palsy presents with: Probable ischemic left 4th nerve palsy Previously seen paraclinoid right ICA aneurysm vs infundibulum not appreciated on most recent CTA.  ***  Subjective:  Madison Jefferson. 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   UPDATE: On 6/30, she developed left temporal headache and blurred vision.  Seen in Urgent Care on 7/1.  Sed rate and CRP were 14 and negative respectively.  Advised to follow up with ophthalmology.  Headache subsequently resolved but noted double vision.  Saw ophthalmology on 7/6 who noted patient had a left 4th nerve palsy.  She was sent to the ED where MRI of brain and orbits with and without contrast personally reviewed were negative.  CTA of head and neck personally reviewed revealed mild atherosclerosis in both carotid bifurcations, moderate right and mild left stenosis of cavernous carotids, fibromuscular dysplasia bilateral ICAs without stenosis or dissection and no intracranial LVO or aneurysm.  Hgb A1c from 6/20 was 5.4%.     HISTORY: She had her first COVID vaccine shot on 10/14/2019.  About a couple of days later, she began noticing horizontal double vision with certain eye movements, such as while driving or sometimes watching TV.  She also had left ocular pain as well.  No headache or decreased vision.  The eye pain lasted about 3 or 4 weeks and has resolved.  She still notes some photosensitivity when she is outside in the sun.  Double vision is still present with certain gaze.  She did receive her second COVID vaccine shot on 11/07/2019.   MRI of brain without contrast on 11/15/2019 personally reviewed showed mild chronic small vessel ischemic changes in the cerebral  white matter but otherwise unremarkable (no brainstem lesion such as stroke or abnormality of cavernous sinus).  MRI of orbits with and without contrast performed on 11/20/2019 personally reviewed showed subtle increased T2 signal in the left lateral rectus muscle, possibly related to denervation.  Labs from March showed TSH 1.330 and Hgb A1c 5.9.  Labs from 12/04/2019 demonstrated sed rate 19, ACE 20, negative CRP, negative SSA/SSB antibodies and negative ANCA.  CTA of head on 12/19/2019 showed 1-2 mm aneurysm vs infundibulum arising from the paraclinoid right ICA but no explanation for symptoms.  Repeat CTA of head on 04/07/2021 again demonstrated stable 1-2 mm small aneurysm vs infundibulum arising from the paraclinoid right ICA.  PAST MEDICAL HISTORY: Past Medical History:  Diagnosis Date   Allergy    Arthritis    Asthma    Atrial fibrillation ([place])    h/o of isolated AF associated with hypothyroidism    Diverticulosis    cecum   Diverticulosis    colonoscopy 2002 [facility]   Dysphagia 06/23/2021   Fatty liver    GERD (gastroesophageal reflux disease)    Hypertension    Lumbar radiculopathy    Thyroid disease    hypothyroid   TIA (transient ischemic attack)    2006   Urinary incontinence    mixed urge and stress    Vitamin D deficiency     MEDICATIONS: Current Outpatient Medications on File Prior to Visit  Medication Sig Dispense Refill   Accu-Chek Softclix Lancets lancets Check blood sugar up to 1 time  a day 100 each 3   albuterol (VENTOLIN HFA) 108 (90 Base) MCG/ACT inhaler INHALE 1 TO 2 PUFFS INTO THE LUNGS EVERY 6 HOURS AS NEEDED FOR WHEEZING OR SHORTNESS OF BREATH (Patient taking differently: Inhale 1-2 puffs into the lungs every 6 (six) hours as needed for wheezing or shortness of breath.) 18 g 1   aspirin EC 81 MG tablet Take 1 tablet (81 mg total) by mouth daily. Swallow whole. 150 tablet 2   Blood Glucose Monitoring Suppl (ACCU-CHEK GUIDE) w/Device KIT Check blood sugar up  to 1 time a day 1 kit 1   cholecalciferol (VITAMIN D3) 25 MCG (1000 UNIT) tablet Take 1 tablet (1,000 Units total) by mouth daily. 90 tablet 1   ferrous sulfate 325 (65 FE) MG tablet Take 1 tablet (325 mg total) by mouth daily. 30 tablet 3   glucose blood (ACCU-CHEK GUIDE) test strip Check blood sugar up to 1 time per day 100 each 3   levothyroxine (SYNTHROID) 100 MCG tablet TAKE 1 TABLET(100 MCG) BY MOUTH DAILY (Patient taking differently: Take 100 mcg by mouth daily before breakfast.) 90 tablet 2   loratadine (CLARITIN) 10 MG tablet TAKE 1 TABLET(10 MG) BY MOUTH DAILY (Patient taking differently: Take 10 mg by mouth daily.) 90 tablet 1   metFORMIN (GLUCOPHAGE-XR) 500 MG 24 hr tablet Take 1 tablet in the morning with breakfast and 2 tablets at night with dinner (Patient taking differently: Take 500-1,000 mg by mouth See admin instructions. Take 1 tablet (500 mg) in the morning with breakfast and 2 tablets (1000 mg) at night with dinner) 360 tablet 3   metoprolol succinate (TOPROL-XL) 100 MG 24 hr tablet TAKE 1 TABLET BY MOUTH EVERY DAY WITH OR IMMEDIATELY FOLLOWING A MEAL 90 tablet 1   mometasone (NASONEX) 50 MCG/ACT nasal spray Place 2 sprays into the nose daily. 17 g 1   Olmesartan-amLODIPine-HCTZ 40-10-25 MG TABS TAKE 1 TABLET BY MOUTH DAILY 90 tablet 1   polyethylene glycol (MIRALAX) 17 g packet Take 17 g by mouth daily as needed for moderate constipation. 14 each 0   potassium chloride SA (KLOR-CON M) 20 MEQ tablet Take 2 tablets (40 mEq total) by mouth 2 (two) times daily for 2 doses. 4 tablet 0   rosuvastatin (CRESTOR) 20 MG tablet Take 1 tablet (20 mg total) by mouth daily. 90 tablet 1   No current facility-administered medications on file prior to visit.    ALLERGIES: Allergies  Allergen Reactions   Oysters [Shellfish Allergy] Rash    FAMILY HISTORY: Family History  Problem Relation Age of Onset   Diabetes Mother    Stroke Mother    Cancer Mother        mother <50 at diagnosis  (breast); h/o oral cancer   Diabetes Father    Heart disease Father    Stroke Father    Hypertension Father    Diabetes Brother    Colon cancer Brother    Diabetes Sister    Diabetes Paternal Uncle    Cancer Brother        lung   Cancer Brother        throat   Esophageal cancer Neg Hx    Rectal cancer Neg Hx    Stomach cancer Neg Hx       Objective:  *** General: No acute distress.  Patient appears well-groomed.   Head:  Normocephalic/atraumatic Eyes:  Fundi examined but not visualized Neck: supple, no paraspinal tenderness, full range of motion Heart:  Regular  rate and rhythm Lungs:  Clear to auscultation bilaterally Back: No paraspinal tenderness Neurological Exam: alert and oriented to person, place, and time.  Speech fluent and not dysarthric, language intact.  CN II-XII intact. Bulk and tone normal, muscle strength 5/5 throughout.  Sensation to light touch intact.  Deep tendon reflexes 2+ throughout, toes downgoing.  Finger to nose testing intact.  Gait normal, Romberg negative.   Metta Clines, DO  CC: Madison Contes, MD

## 2022-04-15 ENCOUNTER — Encounter: Payer: Self-pay | Admitting: Neurology

## 2022-04-15 ENCOUNTER — Ambulatory Visit: Payer: Medicare Other | Admitting: Neurology

## 2022-04-15 DIAGNOSIS — Z029 Encounter for administrative examinations, unspecified: Secondary | ICD-10-CM

## 2022-04-21 ENCOUNTER — Telehealth: Payer: Self-pay | Admitting: *Deleted

## 2022-04-21 NOTE — Telephone Encounter (Signed)
Patient called in stating her [facility] RN just left and asked her to call us for BP 114/42. Denies feeling lightheaded or dizzy. Took metoprolol 100 mg at 0900 this AM. She does not drink many fluids throughout day. Encouraged her to take sips of water every few minutes throughout day. States she will try.

## 2022-04-23 NOTE — Progress Notes (Unsigned)
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   UPDATE: On 6/30, she developed left temporal headache and blurred vision.  Seen in Urgent Care on 7/1.  Sed rate and CRP were 14 and negative respectively.  Advised to follow up with ophthalmology.  Headache subsequently resolved but noted double vision.  Saw ophthalmology on 7/6 who noted patient had a left 4th nerve palsy.  She was sent to the ED where MRI of brain and orbits with and without contrast personally reviewed were negative.  CTA of head and neck personally reviewed revealed mild atherosclerosis in both carotid bifurcations, moderate right and mild left stenosis of cavernous carotids, fibromuscular dysplasia bilateral ICAs without stenosis or dissection and no intracranial LVO or aneurysm.  Hgb A1c from 6/20 was 5.4%.     HISTORY: She had her first COVID vaccine shot on 10/14/2019.  About a couple of days later, she began noticing horizontal double vision with certain eye movements, such as while driving or sometimes watching TV.  She also had left ocular pain as well.  No headache or decreased vision.  The eye pain lasted about 3 or 4 weeks and has resolved.  She still notes some photosensitivity when she is outside in the sun.  Double vision is still present with certain gaze.  She did receive her second COVID vaccine shot on 11/07/2019.   MRI of brain without contrast on 11/15/2019 personally reviewed showed mild chronic small vessel ischemic changes in the cerebral  white matter but otherwise unremarkable (no brainstem lesion such as stroke or abnormality of cavernous sinus).  MRI of orbits with and without contrast performed on 11/20/2019 personally reviewed showed subtle increased T2 signal in the left lateral rectus muscle, possibly related to denervation.  Labs from March showed TSH 1.330 and Hgb A1c 5.9.  Labs from 12/04/2019 demonstrated sed rate 19, ACE 20, negative CRP, negative SSA/SSB antibodies and negative ANCA.  CTA of head on 12/19/2019 showed 1-2 mm aneurysm vs infundibulum arising from the paraclinoid right ICA but no explanation for symptoms.  Repeat CTA of head on 04/07/2021 again demonstrated stable 1-2 mm small aneurysm vs infundibulum arising from the paraclinoid right ICA.  PAST MEDICAL HISTORY: Past Medical History:  Diagnosis Date   Allergy    Arthritis    Asthma    Atrial fibrillation ([place])    h/o of isolated AF associated with hypothyroidism    Diverticulosis    cecum   Diverticulosis    colonoscopy 2002 [facility]   Dysphagia 06/23/2021   Fatty liver    GERD (gastroesophageal reflux disease)    Hypertension    Lumbar radiculopathy    Thyroid disease    hypothyroid   TIA (transient ischemic attack)    2006   Urinary incontinence    mixed urge and stress    Vitamin D deficiency     MEDICATIONS: Current Outpatient Medications on File Prior to Visit  Medication Sig Dispense Refill   Accu-Chek Softclix Lancets lancets Check blood sugar up to 1 time  a day 100 each 3   albuterol (VENTOLIN HFA) 108 (90 Base) MCG/ACT inhaler INHALE 1 TO 2 PUFFS INTO THE LUNGS EVERY 6 HOURS AS NEEDED FOR WHEEZING OR SHORTNESS OF BREATH (Patient taking differently: Inhale 1-2 puffs into the lungs every 6 (six) hours as needed for wheezing or shortness of breath.) 18 g 1   aspirin EC 81 MG tablet Take 1 tablet (81 mg total) by mouth daily. Swallow whole. 150 tablet 2   Blood Glucose Monitoring Suppl (ACCU-CHEK GUIDE) w/Device KIT Check blood sugar up  to 1 time a day 1 kit 1   cholecalciferol (VITAMIN D3) 25 MCG (1000 UNIT) tablet Take 1 tablet (1,000 Units total) by mouth daily. 90 tablet 1   ferrous sulfate 325 (65 FE) MG tablet Take 1 tablet (325 mg total) by mouth daily. 30 tablet 3   glucose blood (ACCU-CHEK GUIDE) test strip Check blood sugar up to 1 time per day 100 each 3   levothyroxine (SYNTHROID) 100 MCG tablet TAKE 1 TABLET(100 MCG) BY MOUTH DAILY (Patient taking differently: Take 100 mcg by mouth daily before breakfast.) 90 tablet 2   loratadine (CLARITIN) 10 MG tablet TAKE 1 TABLET(10 MG) BY MOUTH DAILY (Patient taking differently: Take 10 mg by mouth daily.) 90 tablet 1   metFORMIN (GLUCOPHAGE-XR) 500 MG 24 hr tablet Take 1 tablet in the morning with breakfast and 2 tablets at night with dinner (Patient taking differently: Take 500-1,000 mg by mouth See admin instructions. Take 1 tablet (500 mg) in the morning with breakfast and 2 tablets (1000 mg) at night with dinner) 360 tablet 3   metoprolol succinate (TOPROL-XL) 100 MG 24 hr tablet TAKE 1 TABLET BY MOUTH EVERY DAY WITH OR IMMEDIATELY FOLLOWING A MEAL 90 tablet 1   mometasone (NASONEX) 50 MCG/ACT nasal spray Place 2 sprays into the nose daily. 17 g 1   Olmesartan-amLODIPine-HCTZ 40-10-25 MG TABS TAKE 1 TABLET BY MOUTH DAILY 90 tablet 1   polyethylene glycol (MIRALAX) 17 g packet Take 17 g by mouth daily as needed for moderate constipation. 14 each 0   potassium chloride SA (KLOR-CON M) 20 MEQ tablet Take 2 tablets (40 mEq total) by mouth 2 (two) times daily for 2 doses. 4 tablet 0   rosuvastatin (CRESTOR) 20 MG tablet Take 1 tablet (20 mg total) by mouth daily. 90 tablet 1   No current facility-administered medications on file prior to visit.    ALLERGIES: Allergies  Allergen Reactions   Oysters [Shellfish Allergy] Rash    FAMILY HISTORY: Family History  Problem Relation Age of Onset   Diabetes Mother    Stroke Mother    Cancer Mother        mother <50 at diagnosis  (breast); h/o oral cancer   Diabetes Father    Heart disease Father    Stroke Father    Hypertension Father    Diabetes Brother    Colon cancer Brother    Diabetes Sister    Diabetes Paternal Uncle    Cancer Brother        lung   Cancer Brother        throat   Esophageal cancer Neg Hx    Rectal cancer Neg Hx    Stomach cancer Neg Hx       Objective:  *** General: No acute distress.  Patient appears well-groomed.   Head:  Normocephalic/atraumatic Eyes:  Fundi examined but not visualized Neck: supple, no paraspinal tenderness, full range of motion Heart:  Regular  rate and rhythm Lungs:  Clear to auscultation bilaterally Back: No paraspinal tenderness Neurological Exam: alert and oriented to person, place, and time.  Speech fluent and not dysarthric, language intact.  CN II-XII intact. Bulk and tone normal, muscle strength 5/5 throughout.  Sensation to light touch intact.  Deep tendon reflexes 2+ throughout, toes downgoing.  Finger to nose testing intact.  Gait normal, Romberg negative.   Metta Clines, DO  CC: Aldine Contes, MD

## 2022-04-26 ENCOUNTER — Ambulatory Visit (INDEPENDENT_AMBULATORY_CARE_PROVIDER_SITE_OTHER): Payer: Medicare Other | Admitting: Neurology

## 2022-04-26 ENCOUNTER — Encounter: Payer: Self-pay | Admitting: Neurology

## 2022-04-26 VITALS — BP 127/61 | HR 61 | Ht 61.0 in | Wt 162.2 lb

## 2022-04-26 DIAGNOSIS — H4912 Fourth [trochlear] nerve palsy, left eye: Secondary | ICD-10-CM | POA: Diagnosis not present

## 2022-04-27 ENCOUNTER — Ambulatory Visit (INDEPENDENT_AMBULATORY_CARE_PROVIDER_SITE_OTHER): Payer: Medicare Other | Admitting: Internal Medicine

## 2022-04-27 VITALS — BP 130/50 | HR 62 | Temp 98.3°F | Ht 61.0 in | Wt 163.7 lb

## 2022-04-27 DIAGNOSIS — E1169 Type 2 diabetes mellitus with other specified complication: Secondary | ICD-10-CM | POA: Diagnosis not present

## 2022-04-27 DIAGNOSIS — R82998 Other abnormal findings in urine: Secondary | ICD-10-CM | POA: Diagnosis not present

## 2022-04-27 DIAGNOSIS — D696 Thrombocytopenia, unspecified: Secondary | ICD-10-CM

## 2022-04-27 DIAGNOSIS — D649 Anemia, unspecified: Secondary | ICD-10-CM | POA: Diagnosis not present

## 2022-04-27 DIAGNOSIS — Z23 Encounter for immunization: Secondary | ICD-10-CM | POA: Diagnosis not present

## 2022-04-27 DIAGNOSIS — Z0000 Encounter for general adult medical examination without abnormal findings: Secondary | ICD-10-CM

## 2022-04-27 DIAGNOSIS — E559 Vitamin D deficiency, unspecified: Secondary | ICD-10-CM | POA: Diagnosis not present

## 2022-04-27 DIAGNOSIS — Z7984 Long term (current) use of oral hypoglycemic drugs: Secondary | ICD-10-CM

## 2022-04-27 DIAGNOSIS — K760 Fatty (change of) liver, not elsewhere classified: Secondary | ICD-10-CM | POA: Diagnosis not present

## 2022-04-27 DIAGNOSIS — E876 Hypokalemia: Secondary | ICD-10-CM | POA: Diagnosis not present

## 2022-04-27 DIAGNOSIS — I10 Essential (primary) hypertension: Secondary | ICD-10-CM | POA: Diagnosis not present

## 2022-04-27 LAB — POCT GLYCOSYLATED HEMOGLOBIN (HGB A1C): Hemoglobin A1C: 5.2 % (ref 4.0–5.6)

## 2022-04-27 LAB — GLUCOSE, CAPILLARY: Glucose-Capillary: 140 mg/dL — ABNORMAL HIGH (ref 70–99)

## 2022-04-27 NOTE — Assessment & Plan Note (Signed)
Patient reports feeling well today and denies fatigue. Patient recently had another EGD one month ago and GI resected two hyperplastic gastric polyps. She denies hematochezia or melena. She has been compliant with her iron supplements. A&P: We reviewed the EGD report with patient. Her last CBC showed mild anemia to 11.7. Patient seems to be doing well after polyp resections and has not continued to have symptoms. Plan to recheck labs today.  Plan: -CBC -Iron studies

## 2022-04-27 NOTE — Patient Instructions (Addendum)
Thank you for seeing [place] today Madison Jefferson! Your weight was 162lb today and your blood pressure was 130/50 today. Your hemoglobin a1C today was 5.2. Great job and keep up the good work!   Labwork We are checking your blood count, iron levels, vitamin D levels, liver and kidney function, and a urinalysis today and will call you with results.   Medication Changes We are decreasing your Metformin to '1000mg'$  once a day. Please continue to take all your other medications as prescribed and we may make changes to your medications depending on your labwork results.   3. Health Maintenance You got your flu shot today. Please let us know if you would like the Shingles shot.   Please let us know if you have any questions!

## 2022-04-27 NOTE — Assessment & Plan Note (Signed)
Patient's last CBC one month ago was reassuring for normal platelet count at 352. Will plan to recheck today.  Plan: -CBC

## 2022-04-27 NOTE — Assessment & Plan Note (Addendum)
Patient has an acute concern of brown urine that started during her last hospitalization. Patient feels this may be due to not drinking enough water. She denies dysuria, urinary frequency, or suprapubic discomfort.  No suprapubic tenderness on exam. A&P: Patient had an elevated total bilirubin to 1.7 two months ago, unclear if this is contributing. Also consider if rhabdomyolysis or hematuria. Plan: -Urinalysis -serum CK

## 2022-04-27 NOTE — Progress Notes (Cosign Needed Addendum)
Subjective:   [name]   DOB: [birth year] 71 y.o.   MRN: [id number]  HPI: [name] is a 71 y.o. with the pmhx listed below who presents to [facility] for routine follow-up. Please see Plan for individualized problem-based charting.   Patient Active Problem List   Diagnosis Date Noted   Dark brown urine 04/27/2022   HLD (hyperlipidemia) 02/02/2022   Thrombocytopenia ([place]) 02/02/2022   Hypokalemia 01/30/2022   Gastric polyp    Symptomatic anemia 01/25/2022   Right abducens nerve palsy 11/15/2019   Adhesive capsulitis of right shoulder 10/25/2016   Osteoarthritis of left glenohumeral joint 06/13/2016   Degenerative arthritis of thumb, left 06/01/2016   Right knee pain 07/09/2014   Diabetes ([place]) 09/20/2013   Diverticulosis    Obesity, unspecified 10/12/2012   Health care maintenance 07/03/2012   Vitamin D deficiency 12/24/2009   Fatty liver 10/29/2009   Allergic rhinitis 10/21/2009   GERD 10/21/2009   Asthma 07/23/2009   Hypothyroidism 09/05/2008   HYPERTENSION, BENIGN 09/25/2007   BACK PAIN WITH RADICULOPATHY 09/25/2007     Current Outpatient Medications  Medication Sig Dispense Refill   Accu-Chek Softclix Lancets lancets Check blood sugar up to 1 time a day 100 each 3   albuterol (VENTOLIN HFA) 108 (90 Base) MCG/ACT inhaler INHALE 1 TO 2 PUFFS INTO THE LUNGS EVERY 6 HOURS AS NEEDED FOR WHEEZING OR SHORTNESS OF BREATH (Patient taking differently: Inhale 1-2 puffs into the lungs every 6 (six) hours as needed for wheezing or shortness of breath.) 18 g 1   aspirin EC 81 MG tablet Take 1 tablet (81 mg total) by mouth daily. Swallow whole. 150 tablet 2   Blood Glucose Monitoring Suppl (ACCU-CHEK GUIDE) w/Device KIT Check blood sugar up to 1 time a day 1 kit 1   cholecalciferol (VITAMIN D3) 25 MCG (1000 UNIT) tablet Take 1 tablet (1,000 Units total) by mouth daily. 90 tablet 1   ferrous sulfate 325 (65 FE) MG tablet Take 1 tablet (325 mg total) by mouth  daily. 30 tablet 3   glucose blood (ACCU-CHEK GUIDE) test strip Check blood sugar up to 1 time per day 100 each 3   levothyroxine (SYNTHROID) 100 MCG tablet TAKE 1 TABLET(100 MCG) BY MOUTH DAILY (Patient taking differently: Take 100 mcg by mouth daily before breakfast.) 90 tablet 2   loratadine (CLARITIN) 10 MG tablet TAKE 1 TABLET(10 MG) BY MOUTH DAILY (Patient taking differently: Take 10 mg by mouth daily.) 90 tablet 1   metFORMIN (GLUCOPHAGE-XR) 500 MG 24 hr tablet Take 1 tablet in the morning with breakfast and 2 tablets at night with dinner (Patient taking differently: Take 500-1,000 mg by mouth See admin instructions. Take 1 tablet (500 mg) in the morning with breakfast and 2 tablets (1000 mg) at night with dinner) 360 tablet 3   metoprolol succinate (TOPROL-XL) 100 MG 24 hr tablet TAKE 1 TABLET BY MOUTH EVERY DAY WITH OR IMMEDIATELY FOLLOWING A MEAL 90 tablet 1   mometasone (NASONEX) 50 MCG/ACT nasal spray Place 2 sprays into the nose daily. 17 g 1   Olmesartan-amLODIPine-HCTZ 40-10-25 MG TABS TAKE 1 TABLET BY MOUTH DAILY 90 tablet 1   polyethylene glycol (MIRALAX) 17 g packet Take 17 g by mouth daily as needed for moderate constipation. 14 each 0   potassium chloride SA (KLOR-CON M) 20 MEQ tablet Take 2 tablets (40 mEq total) by mouth 2 (two) times daily for 2 doses. 4 tablet 0  rosuvastatin (CRESTOR) 20 MG tablet Take 1 tablet (20 mg total) by mouth daily. 90 tablet 1   No current facility-administered medications for this visit.     Review of Systems:   Review of systems is negative other than what is noted in individual problem-based charting.   Objective:   Physical Exam: Vitals:   04/27/22 1033  BP: (!) 130/50  Pulse: 62  Temp: 98.3 F (36.8 C)  TempSrc: Oral  SpO2: 100%  Weight: 163 lb 11.2 oz (74.3 kg)  Height: _0  (1.549 m)   Physical Exam Vitals reviewed.  Constitutional:      General: She is not in acute distress. Eyes:     Extraocular Movements:  Extraocular movements intact.     Pupils: Pupils are equal, round, and reactive to light.  Cardiovascular:     Rate and Rhythm: Normal rate and regular rhythm.     Heart sounds: No murmur heard. Pulmonary:     Effort: Pulmonary effort is normal. No respiratory distress.     Breath sounds: Normal breath sounds. No wheezing.  Abdominal:     General: Abdomen is flat. Bowel sounds are normal. There is no distension.     Palpations: Abdomen is soft.     Tenderness: There is no abdominal tenderness. There is no guarding.  Musculoskeletal:     Right lower leg: No edema.     Left lower leg: No edema.  Skin:    General: Skin is warm and dry.  Neurological:     General: No focal deficit present.     Mental Status: She is alert and oriented to person, place, and time.     Cranial Nerves: Cranial nerves 2-12 are intact.      Assessment & Plan:   Diabetes [facility]) Patient has chronic well-controlled diabetes managed with Metformin 536m in the AM and 1001min the PM. Patient endorses compliance to regimen. Patient has also continued to work on her diet for weight loss. Patient endorses feeling well and denies any nausea, abdominal bloating/discomfort, constipation, diarrhea polyuria, polydipsia. Last a1C three months ago was 5.4. A&P: Hba1C today remains improved at 5.2. Patient has been doing extremely well with glucose control in context of medication, diet control, and weight loss of 40lb over the last two years. Plan is to begin gradually titrating down Metformin while monitoring a1C to remain at goal. Today we commended patient on her weight loss progress and encouraged her to continue with current diet regimen. Plan: -Decrease Metformin to 100014mnce daily -Recheck a1C next visit  HYPERTENSION, BENIGN Patient has chronic well-controlled hypertension managed with Olmesartan-amlodipine-HCTZ. Patient reports good adherence to regimen and denies dizziness, lightheadedness, or recent falls.   BP in office today was well-controlled at 130/50. Plan: -continue Olmesartan-amlodipine-HCTZ 40-10-25mg   Fatty liver Patient has lost 40lb in the last two years due to diet changes. We encouraged patient to continue diet regimen to sustain her progress.  Plan: -Check LFTs today  Dark brown urine Patient has an acute concern of brown urine that started during her last hospitalization. Patient feels this may be due to not drinking enough water. She denies dysuria, urinary frequency, or suprapubic discomfort.  No suprapubic tenderness on exam. A&P: Patient had an elevated total bilirubin to 1.7 two months ago, unclear if this is contributing. Also consider if rhabdomyolysis or hematuria. Plan: -Urinalysis -serum CK  Symptomatic anemia Patient reports feeling well today and denies fatigue. Patient recently had another EGD one month ago and GI resected  two hyperplastic gastric polyps. She denies hematochezia or melena. She has been compliant with her iron supplements. A&P: We reviewed the EGD report with patient. Her last CBC showed mild anemia to 11.7. Patient seems to be doing well after polyp resections and has not continued to have symptoms. Plan to recheck labs today.  Plan: -CBC -Iron studies   Thrombocytopenia ([place]) Patient's last CBC one month ago was reassuring for normal platelet count at 352. Will plan to recheck today.  Plan: -CBC  Vitamin D deficiency Patient reports she has continued to take vitamin D supplements. Last vit D level three months ago was within normal limits at 48.3 ng/mL.  Plan: -Check Vitamin D level today -Continue Vitamin D supplement  Hypokalemia Patient was given 4 tabs of potassium supplement post-discharge but has not taken additional potassium supplementation since then. Her last K was 3.5, will plan to recheck today as last level was borderline low-normal. Plan: -CMP today  Health care maintenance Flu shot given today. Discussed  Shingrix but patient wants to think about it. Plan to discuss next visit.

## 2022-04-27 NOTE — Assessment & Plan Note (Signed)
Patient has chronic well-controlled hypertension managed with Olmesartan-amlodipine-HCTZ. Patient reports good adherence to regimen and denies dizziness, lightheadedness, or recent falls.  BP in office today was well-controlled at 130/50. Plan: -continue Olmesartan-amlodipine-HCTZ 40-10-'25mg'$ 

## 2022-04-27 NOTE — Assessment & Plan Note (Signed)
Patient was given 4 tabs of potassium supplement post-discharge but has not taken additional potassium supplementation since then. Her last K was 3.5, will plan to recheck today as last level was borderline low-normal. Plan: -CMP today

## 2022-04-27 NOTE — Assessment & Plan Note (Signed)
Patient has lost 40lb in the last two years due to diet changes. We encouraged patient to continue diet regimen to sustain her progress.  Plan: -Check LFTs today

## 2022-04-27 NOTE — Assessment & Plan Note (Signed)
Flu shot given today. Discussed Shingrix but patient wants to think about it. Plan to discuss next visit.

## 2022-04-27 NOTE — Assessment & Plan Note (Signed)
Patient has chronic well-controlled diabetes managed with Metformin '500mg'$  in the AM and '1000mg'$  in the PM. Patient endorses compliance to regimen. Patient has also continued to work on her diet for weight loss. Patient endorses feeling well and denies any nausea, abdominal bloating/discomfort, constipation, diarrhea polyuria, polydipsia. Last a1C three months ago was 5.4. A&P: Hba1C today remains improved at 5.2. Patient has been doing extremely well with glucose control in context of medication, diet control, and weight loss of 40lb over the last two years. Plan is to begin gradually titrating down Metformin while monitoring a1C to remain at goal. Today we commended patient on her weight loss progress and encouraged her to continue with current diet regimen. Plan: -Decrease Metformin to '1000mg'$  once daily -Recheck a1C next visit

## 2022-04-27 NOTE — Assessment & Plan Note (Addendum)
Patient reports she has continued to take vitamin D supplements. Last vit D level three months ago was within normal limits at 48.3 ng/mL.  Plan: -Check Vitamin D level today -Continue Vitamin D supplement

## 2022-04-28 LAB — CMP14 + ANION GAP
ALT: 32 IU/L (ref 0–32)
AST: 31 IU/L (ref 0–40)
Albumin/Globulin Ratio: 1.8 (ref 1.2–2.2)
Albumin: 4.5 g/dL (ref 3.8–4.8)
Alkaline Phosphatase: 43 IU/L — ABNORMAL LOW (ref 44–121)
Anion Gap: 19 mmol/L — ABNORMAL HIGH (ref 10.0–18.0)
BUN/Creatinine Ratio: 15 (ref 12–28)
BUN: 16 mg/dL (ref 8–27)
Bilirubin Total: 1.2 mg/dL (ref 0.0–1.2)
CO2: 24 mmol/L (ref 20–29)
Calcium: 9.4 mg/dL (ref 8.7–10.3)
Chloride: 101 mmol/L (ref 96–106)
Creatinine, Ser: 1.04 mg/dL — ABNORMAL HIGH (ref 0.57–1.00)
Globulin, Total: 2.5 g/dL (ref 1.5–4.5)
Glucose: 119 mg/dL — ABNORMAL HIGH (ref 70–99)
Potassium: 4.3 mmol/L (ref 3.5–5.2)
Sodium: 144 mmol/L (ref 134–144)
Total Protein: 7 g/dL (ref 6.0–8.5)
eGFR: 57 mL/min/{1.73_m2} — ABNORMAL LOW (ref >59)

## 2022-04-28 LAB — CBC
Hematocrit: 34.8 % (ref 34.0–46.6)
Hemoglobin: 12 g/dL (ref 11.1–15.9)
MCH: 28.8 pg (ref 26.6–33.0)
MCHC: 34.5 g/dL (ref 31.5–35.7)
MCV: 84 fL (ref 79–97)
Platelets: 148 10*3/uL — ABNORMAL LOW (ref 150–450)
RBC: 4.16 x10E6/uL (ref 3.77–5.28)
RDW: 13.4 % (ref 11.7–15.4)
WBC: 6.2 10*3/uL (ref 3.4–10.8)

## 2022-04-28 LAB — MICROSCOPIC EXAMINATION
Bacteria, UA: NONE SEEN
Casts: NONE SEEN /lpf
Epithelial Cells (non renal): NONE SEEN /hpf (ref 0–10)
RBC, Urine: NONE SEEN /hpf (ref 0–2)
WBC, UA: NONE SEEN /hpf (ref 0–5)

## 2022-04-28 LAB — IRON AND TIBC
Iron Saturation: 32 % (ref 15–55)
Iron: 106 ug/dL (ref 27–139)
Total Iron Binding Capacity: 335 ug/dL (ref 250–450)
UIBC: 229 ug/dL (ref 118–369)

## 2022-04-28 LAB — URINALYSIS, COMPLETE
Bilirubin, UA: NEGATIVE
Glucose, UA: NEGATIVE
Ketones, UA: NEGATIVE
Leukocytes,UA: NEGATIVE
Nitrite, UA: NEGATIVE
Protein,UA: NEGATIVE
RBC, UA: NEGATIVE
Specific Gravity, UA: 1.014 (ref 1.005–1.030)
Urobilinogen, Ur: 1 mg/dL (ref 0.2–1.0)
pH, UA: 6 (ref 5.0–7.5)

## 2022-04-28 LAB — CK: Total CK: 55 U/L (ref 32–182)

## 2022-04-28 LAB — FERRITIN: Ferritin: 63 ng/mL (ref 15–150)

## 2022-04-28 LAB — VITAMIN D 25 HYDROXY (VIT D DEFICIENCY, FRACTURES): Vit D, 25-Hydroxy: 47.9 ng/mL (ref 30.0–100.0)

## 2022-04-28 NOTE — Progress Notes (Signed)
Attestation for Student Documentation:  I personally was present and performed or re-performed the history, physical exam and medical decision-making activities of this service and have verified that the service and findings are accurately documented in the student's note.  Aldine Contes, MD 04/28/2022, 11:17 AM

## 2022-05-03 ENCOUNTER — Encounter: Payer: Self-pay | Admitting: *Deleted

## 2022-05-03 NOTE — Progress Notes (Signed)
[facility] Quality Team Note  Name: Madison Jefferson Date of Birth: May 16, 1951 MRN: [id number] Date: 05/03/2022  [facility] Quality Team has reviewed this patient's chart, please see recommendations below:  [place]; [place] Coordinator mailed patient a KED home kit. [facility] will fax results directly to provider office.

## 2022-05-12 DIAGNOSIS — E119 Type 2 diabetes mellitus without complications: Secondary | ICD-10-CM | POA: Diagnosis not present

## 2022-05-22 IMAGING — CT CT ANGIO HEAD
2 of 4 series · 6 of 30 positions shown · IV contrast (iopamidol)
Comparison: December 19, 2019.

CLINICAL DATA: Cerebral Aneurysm

EXAM:
CT ANGIOGRAPHY HEAD
TECHNIQUE: Multidetector CT imaging of the head was performed using the
standard protocol during bolus administration of intravenous
contrast. Multiplanar CT image reconstructions and MIPs were
obtained to evaluate the vascular anatomy.
CONTRAST:  75mL EUXRP1-RI0 IOPAMIDOL (EUXRP1-RI0) INJECTION 76%

[Series 3: head w/(date) · axial · 0.44mm/px · z∈[-125,-70]mm · 2 of 33 slices shown]
[im 11/33  brain]
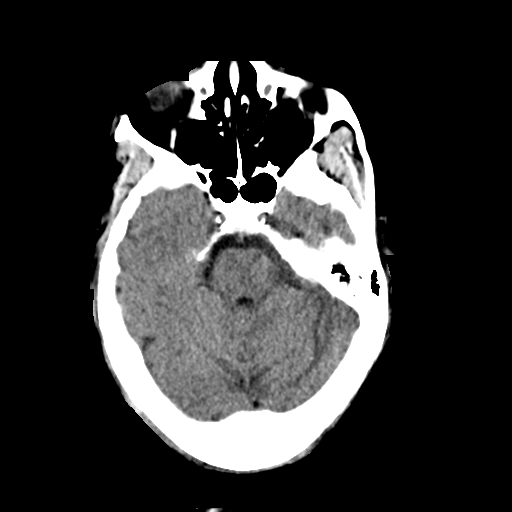
[im 22/33  brain]
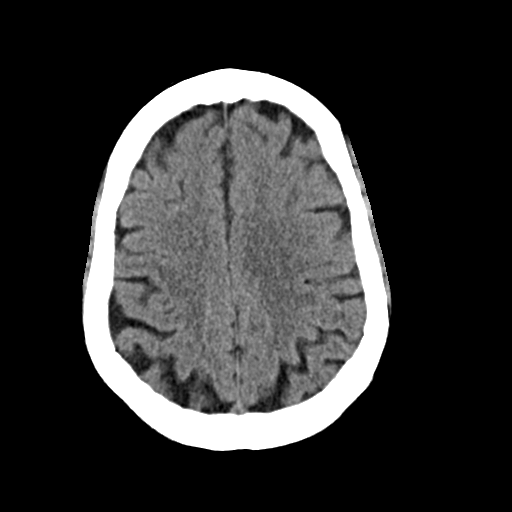

[Series 7: head angio · axial · 0.44mm/px · z∈[-142,-43]mm · 4 of 57 slices shown]
[im 12/57  brain]
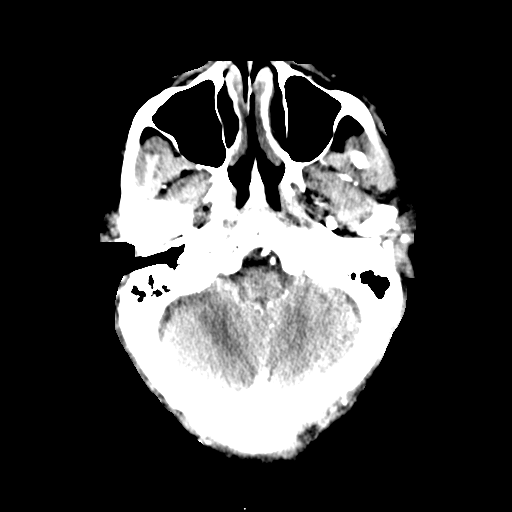
[im 23/57  bone]
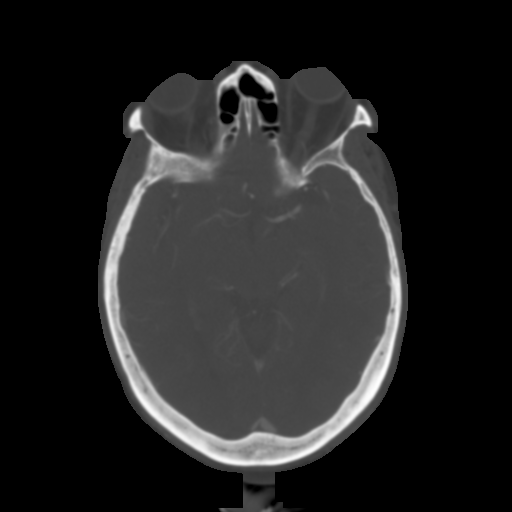
[im 34/57  brain]
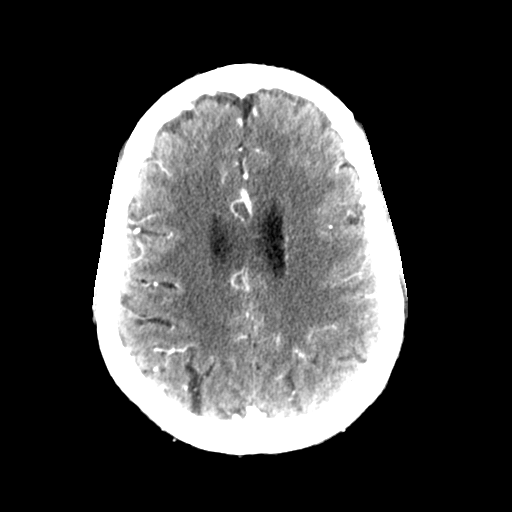
[im 45/57  bone]
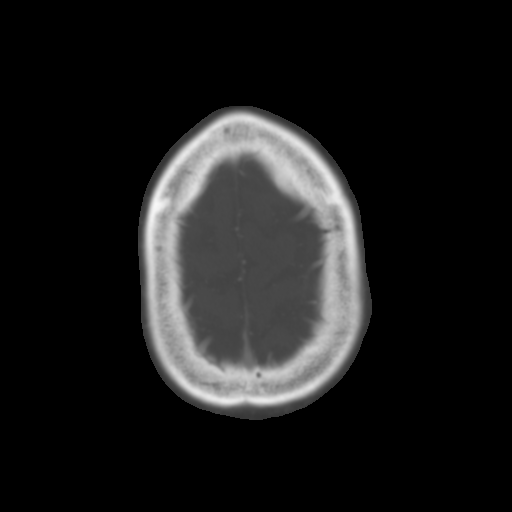

[6 of 30 positions shown; findings below may reference images not displayed]

FINDINGS: CT HEAD

Brain: No evidence of acute large vascular territory infarction,
hemorrhage, hydrocephalus, extra-axial collection or mass
lesion/mass effect. Mild patchy white matter hypoattenuation,
nonspecific but potentially related to chronic microvascular
disease.

Vascular: See below.

Skull: No acute fracture.

Sinuses: Visualized sinuses are clear.

Other: No mastoid effusions.  No acute orbital findings.

CTA HEAD

Anterior circulation: Similar calcific atherosclerosis of bilateral
intracranial ICAs with mild narrowing. Bilateral MCAs and ACAs are
patent without proximal hemodynamically significant stenosis. No
change in size/appearance of a 1-2 mm inferiorly projecting
outpouching arising from the paraclinoid right ICA (series 13, image
87).

Posterior circulation: Similar right dominant intradural vertebral
artery. Bilateral intradural vertebral arteries, basilar artery and
posterior cerebral arteries are patent without proximal
hemodynamically significant stenosis. Hypoplastic or absent
posterior communicating arteries.

Venous sinuses: As permitted by contrast timing, patent.

Review of the MIP images confirms the above findings.
IMPRESSION: CT head:

1. No evidence of acute intracranial abnormality.
2. Mild chronic microvascular ischemic disease.

CTA head:

1. Stable 1-2 mm small aneurysm versus infundibulum arising from the
paraclinoid right ICA.
2. No large vessel occlusion or proximal hemodynamically significant
stenosis.
3. Similar mild atherosclerositic narrowing of bilateral
intracranial ICAs.

## 2022-07-07 ENCOUNTER — Other Ambulatory Visit: Payer: Self-pay | Admitting: Internal Medicine

## 2022-07-07 DIAGNOSIS — I10 Essential (primary) hypertension: Secondary | ICD-10-CM

## 2022-08-04 ENCOUNTER — Encounter: Payer: Self-pay | Admitting: *Deleted

## 2022-09-13 ENCOUNTER — Other Ambulatory Visit: Payer: Self-pay | Admitting: Internal Medicine

## 2022-09-13 DIAGNOSIS — E785 Hyperlipidemia, unspecified: Secondary | ICD-10-CM

## 2022-09-13 DIAGNOSIS — J301 Allergic rhinitis due to pollen: Secondary | ICD-10-CM

## 2022-09-28 DIAGNOSIS — D131 Benign neoplasm of stomach: Secondary | ICD-10-CM | POA: Insufficient documentation

## 2022-09-28 NOTE — Progress Notes (Unsigned)
[facility]: Clinic Note  Subjective:  History of Present Illness: Madison Jefferson is a 72 y.o. year old female who presents for routine follow up of her chronic medical conditions. She was Dr. Wilber Bihari patient, last seen in 04/2022, now establishing care with me.   Her main concern today is right shoulder pain.   Please refer to Assessment and Plan below for full details in Problem-Based Charting.   Past Medical History:  Patient Active Problem List   Diagnosis Date Noted   Acute pain of right shoulder 09/29/2022   Healthcare maintenance 09/29/2022   Benign neoplasm of stomach 09/28/2022   Dark brown urine 04/27/2022   HLD (hyperlipidemia) 02/02/2022   Thrombocytopenia ([place]) 02/02/2022   Gastric polyp    Right abducens nerve palsy 11/15/2019   Adhesive capsulitis of right shoulder 10/25/2016   Osteoarthritis of left glenohumeral joint 06/13/2016   Degenerative arthritis of thumb, left 06/01/2016   Right knee pain 07/09/2014   Type 2 diabetes mellitus ([place]) 09/20/2013   Diverticulosis    Obesity, unspecified 10/12/2012   Vitamin D deficiency 12/24/2009   Fatty liver 10/29/2009   Allergic rhinitis 10/21/2009   GERD 10/21/2009   Asthma 07/23/2009   Hypothyroidism 09/05/2008   Essential hypertension 09/25/2007   BACK PAIN WITH RADICULOPATHY 09/25/2007     Medications:  Current Outpatient Medications:    ibuprofen (MOTRIN IB) 200 MG tablet, Take 1 tablet (200 mg total) by mouth every 6 (six) hours as needed., Disp: 120 tablet, Rfl: 0   albuterol (VENTOLIN HFA) 108 (90 Base) MCG/ACT inhaler, INHALE 1 TO 2 PUFFS INTO THE LUNGS EVERY 6 HOURS AS NEEDED FOR WHEEZING OR SHORTNESS OF BREATH (Patient taking differently: Inhale 1-2 puffs into the lungs every 6 (six) hours as needed for wheezing or shortness of breath.), Disp: 18 g, Rfl: 1   aspirin EC 81 MG tablet, Take 1 tablet (81 mg total) by mouth daily. Swallow whole., Disp: 150 tablet, Rfl: 2    cholecalciferol (VITAMIN D3) 25 MCG (1000 UNIT) tablet, Take 1 tablet (1,000 Units total) by mouth daily., Disp: 90 tablet, Rfl: 1   levothyroxine (SYNTHROID) 100 MCG tablet, Take 1 tablet (100 mcg total) by mouth daily before breakfast., Disp: 90 tablet, Rfl: 3   loratadine (CLARITIN) 10 MG tablet, Take 1 tablet (10 mg total) by mouth daily., Disp: 90 tablet, Rfl: 3   metFORMIN (GLUCOPHAGE-XR) 500 MG 24 hr tablet, Take 1 tablet (500 mg total) by mouth 2 (two) times daily with a meal., Disp: 360 tablet, Rfl: 3   metoprolol succinate (TOPROL-XL) 100 MG 24 hr tablet, TAKE 1 TABLET BY MOUTH EVERY DAY WITH OR IMMEDIATELY FOLLOWING A MEAL, Disp: 90 tablet, Rfl: 1   mometasone (NASONEX) 50 MCG/ACT nasal spray, Place 2 sprays into the nose daily., Disp: 17 g, Rfl: 1   Olmesartan-amLODIPine-HCTZ 40-10-25 MG TABS, Take 1 tablet by mouth daily., Disp: 90 tablet, Rfl: 1   polyethylene glycol (MIRALAX) 17 g packet, Take 17 g by mouth daily as needed for moderate constipation., Disp: 14 each, Rfl: 0   rosuvastatin (CRESTOR) 20 MG tablet, TAKE 1 TABLET(20 MG) BY MOUTH DAILY, Disp: 90 tablet, Rfl: 1   Allergies: Allergies  Allergen Reactions   Oysters [Shellfish Allergy] Rash      Objective:   Vitals: Vitals:   09/29/22 0951 09/29/22 1029  BP: (!) 142/52 (!) 133/49  Pulse: 63 66  Temp: 98.2 F (36.8 C)   SpO2: 100%  Physical Exam: Physical Exam Constitutional:      Appearance: Normal appearance.  Musculoskeletal:     Comments: Full range of motion of R shoulder. Pain with abduction >90 degrees. Negative empty can test. Negative spurling. 5/5 strength in bilateral upper extremities. Sensation in tact in bilateral upper extremities.   Neurological:     Mental Status: She is alert.      Data: Labs, imaging, and micro were reviewed in Epic. Refer to Assessment and Plan below for full details in Problem-Based Charting.  Assessment & Plan:  Acute pain of right shoulder - For the past  1-2 months, she has had intermittent right shoulder pain, worse with movement, especially with raising her right arm. No preceding trauma. She has taken an OTC arthritis medicine, which has helped. No weakness in R arm.  - On exam, she has pain with abduction >90 degrees - I think her presentation fits with rotator cuff tendinopathy, no evidence of a tear - Ibuprofen 259m q6 hours prn pain x 4 weeks - PT referral   Type 2 diabetes mellitus ([place] - Patient has been trying to wean off Metformin. A1C was 5.2 last time, and Metformin was decreased slowly to 5031mBID. Today, A1C is 6.2 - I don't think we should taper Metformin more today, so continue 50034mID - Repeat A1C in 3 months, and consider taper if it's decreasing with lifestyle changes - [facility]  - Urine microalbumin today - Will need eye exam referral next time  Essential hypertension - Well controlled on Olmesartan-Amlodipine-HCTZ 40-10-25 and Metoprolol XL 100m37mily   Healthcare maintenance - Recommended that she go to pharmacy for Shingrix vaccine  - It looks like she is on aspirin for primary prevention, and I'm not sure she needs it. We discussed today, and she'd like to continue for now. I will dig more into her chart and continue to address at each visit.   Vitamin D deficiency - vitamin D levels were well within normal range (47) at last visit, so I think she could come off this eventually.     Patient will follow up in 3 months  JuliLottie Mussel

## 2022-09-29 ENCOUNTER — Ambulatory Visit (INDEPENDENT_AMBULATORY_CARE_PROVIDER_SITE_OTHER): Payer: 59 | Admitting: Internal Medicine

## 2022-09-29 ENCOUNTER — Encounter: Payer: Self-pay | Admitting: Internal Medicine

## 2022-09-29 VITALS — BP 133/49 | HR 66 | Temp 98.2°F | Ht 61.0 in | Wt 175.2 lb

## 2022-09-29 DIAGNOSIS — I10 Essential (primary) hypertension: Secondary | ICD-10-CM

## 2022-09-29 DIAGNOSIS — E1169 Type 2 diabetes mellitus with other specified complication: Secondary | ICD-10-CM | POA: Diagnosis not present

## 2022-09-29 DIAGNOSIS — M25511 Pain in right shoulder: Secondary | ICD-10-CM | POA: Insufficient documentation

## 2022-09-29 DIAGNOSIS — E039 Hypothyroidism, unspecified: Secondary | ICD-10-CM | POA: Diagnosis not present

## 2022-09-29 DIAGNOSIS — Z0000 Encounter for general adult medical examination without abnormal findings: Secondary | ICD-10-CM | POA: Insufficient documentation

## 2022-09-29 DIAGNOSIS — E559 Vitamin D deficiency, unspecified: Secondary | ICD-10-CM

## 2022-09-29 LAB — POCT GLYCOSYLATED HEMOGLOBIN (HGB A1C): Hemoglobin A1C: 6.2 % — AB (ref 4.0–5.6)

## 2022-09-29 LAB — GLUCOSE, CAPILLARY: Glucose-Capillary: 134 mg/dL — ABNORMAL HIGH (ref 70–99)

## 2022-09-29 MED ORDER — LEVOTHYROXINE SODIUM 100 MCG PO TABS: 100.0000 ug | ORAL_TABLET | Freq: Every day | ORAL | 3 refills | Status: DC

## 2022-09-29 MED ORDER — OLMESARTAN-AMLODIPINE-HCTZ 40-10-25 MG PO TABS: 1.0000 | ORAL_TABLET | Freq: Every day | ORAL | 1 refills | Status: DC

## 2022-09-29 MED ORDER — IBUPROFEN 200 MG PO TABS: 200.0000 mg | ORAL_TABLET | Freq: Four times a day (QID) | ORAL | 0 refills | Status: AC | PRN

## 2022-09-29 MED ORDER — METFORMIN HCL ER 500 MG PO TB24: 500.0000 mg | ORAL_TABLET | Freq: Two times a day (BID) | ORAL | 3 refills | Status: DC

## 2022-09-29 NOTE — Assessment & Plan Note (Signed)
-   Patient has been trying to wean off Metformin. A1C was 5.2 last time, and Metformin was decreased slowly to '500mg'$  BID. Today, A1C is 6.2 - I don't think we should taper Metformin more today, so continue '500mg'$  BID - Repeat A1C in 3 months, and consider taper if it's decreasing with lifestyle changes - Referral to PREP program at [place] healthy diet & increased physical activity  - Urine microalbumin today - Will need eye exam referral next time

## 2022-09-29 NOTE — Assessment & Plan Note (Signed)
-   Well controlled on Olmesartan-Amlodipine-HCTZ 40-10-25 and Metoprolol XL '100mg'$  daily

## 2022-09-29 NOTE — Assessment & Plan Note (Signed)
-   For the past 1-2 months, she has had intermittent right shoulder pain, worse with movement, especially with raising her right arm. No preceding trauma. She has taken an OTC arthritis medicine, which has helped. No weakness in R arm.  - On exam, she has pain with abduction >90 degrees - I think her presentation fits with rotator cuff tendinopathy, no evidence of a tear - Ibuprofen '200mg'$  q6 hours prn pain x 4 weeks - PT referral

## 2022-09-29 NOTE — Assessment & Plan Note (Addendum)
-   Recommended that she go to pharmacy for Shingrix vaccine  - It looks like she is on aspirin for primary prevention, and I'm not sure she needs it. We discussed today, and she'd like to continue for now. I will dig more into her chart and continue to address at each visit.

## 2022-09-29 NOTE — Patient Instructions (Addendum)
Dear Ms Blankenbaker,  It was a pleasure seeing you in clinic today.  For your right shoulder pain, I have placed a referral to physical therapy. You may also take ibuprofen 266m up to 4 times daily for this pain. The ibuprofen should not be a long-term medicine, but hopefully will help to decrease the inflammation over the next few weeks.  For your diabetes, keep taking your Metformin 5081mtwice daily. Your A1C went up to 6.2 today, so I don't want to decrease this medicine any further. [facility] and [facility] will call you to schedule.  You are due for your Shingles vaccine (Shingrix). We don't have this in our clinic, but you can go to a pharmacy, such as [facility], to get this done. We recommend that you get this.    I'll see you back in 3 months.  Sincerely, Dr. JuLottie Mussel

## 2022-09-29 NOTE — Assessment & Plan Note (Signed)
-   vitamin D levels were well within normal range (47) at last visit, so I think she could come off this eventually.

## 2022-10-01 ENCOUNTER — Telehealth: Payer: Self-pay

## 2022-10-01 LAB — MICROALBUMIN / CREATININE URINE RATIO
Creatinine, Urine: 71.8 mg/dL
Microalb/Creat Ratio: 35 mg/g creat — ABNORMAL HIGH (ref 0–29)
Microalbumin, Urine: 25.2 ug/mL

## 2022-10-01 NOTE — Telephone Encounter (Signed)
Called to discuss PREP program, she would like to attend April T/Th class 12-1:15; start date TBD; will contact mid-end of March with exact start date, confirm she wants to attend and set up assessment visit.

## 2022-10-05 ENCOUNTER — Other Ambulatory Visit: Payer: Self-pay | Admitting: Internal Medicine

## 2022-10-05 DIAGNOSIS — E039 Hypothyroidism, unspecified: Secondary | ICD-10-CM

## 2022-10-08 ENCOUNTER — Other Ambulatory Visit: Payer: Self-pay | Admitting: Internal Medicine

## 2022-10-08 DIAGNOSIS — E1169 Type 2 diabetes mellitus with other specified complication: Secondary | ICD-10-CM

## 2022-10-22 NOTE — Progress Notes (Unsigned)
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  Migraine also is possible.  Regardless, I would continue maximum medical management for stroke prevention. Previously seen paraclinoid right ICA aneurysm vs infundibulum not appreciated on most recent CTA.  Management as per PCP: ASA '81mg'$  daily Statin therapy Blood pressure control Glycemic control Follow up as needed.  Subjective:  Madison Dicker. 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   UPDATE: No issues.  No visual complaints.     HISTORY: She had her first COVID vaccine shot on 10/14/2019.  About a couple of days later, she began noticing horizontal double vision with certain eye movements, such as while driving or sometimes watching TV.  She also had left ocular pain as well.  No headache or decreased vision.  The eye pain lasted about 3 or 4 weeks and has resolved.  She still notes some photosensitivity when she is outside in the sun.  Double vision is still present with certain gaze.  She did receive her second COVID vaccine shot on 11/07/2019.  MRI of brain without contrast on 11/15/2019 showed mild chronic small vessel ischemic changes in the cerebral white matter but otherwise unremarkable (no brainstem lesion such as stroke or abnormality of cavernous sinus).  MRI of orbits with and without contrast performed on 11/20/2019 showed subtle increased T2 signal in the left lateral rectus muscle, possibly related to denervation.  Labs from March 2021 showed TSH 1.330 and Hgb A1c 5.9.  Labs from  12/04/2019 demonstrated sed rate 19, ACE 20, negative CRP, negative SSA/SSB antibodies and negative ANCA.  CTA of head on 12/19/2019 showed 1-2 mm aneurysm vs infundibulum arising from the paraclinoid right ICA but no explanation for symptoms.  Repeat CTA of head on 04/07/2021 again demonstrated stable 1-2 mm small aneurysm vs infundibulum arising from the paraclinoid right ICA.  On 02/12/2022, she developed left temporal headache and blurred vision.  Seen in Urgent Care on 7/1.  Sed rate and CRP were 14 and negative respectively.  Advised to follow up with ophthalmology.  Headache subsequently resolved but noted double vision.  Saw ophthalmology on 7/6 who noted patient had a left 4th nerve palsy.  She was sent to the ED where MRI of brain and orbits with and without contrast personally reviewed were negative.  CTA of head and neck personally reviewed revealed mild atherosclerosis in both carotid bifurcations, moderate right and mild left stenosis of cavernous carotids, fibromuscular dysplasia bilateral ICAs without stenosis or dissection and no intracranial LVO or aneurysm.  Hgb A1c from 6/20 was 5.4%.  LDL was 85.  PAST MEDICAL HISTORY: Past Medical History:  Diagnosis Date   Allergy    Arthritis    Asthma    Atrial fibrillation ([place])    h/o of isolated AF associated with hypothyroidism    Diverticulosis    cecum   Diverticulosis    colonoscopy 2002 [facility]   Dysphagia 06/23/2021   Fatty liver    GERD (gastroesophageal reflux disease)    Hypertension    Lumbar radiculopathy  Thyroid disease    hypothyroid   TIA (transient ischemic attack)    2006   Urinary incontinence    mixed urge and stress    Vitamin D deficiency     MEDICATIONS: Current Outpatient Medications on File Prior to Visit  Medication Sig Dispense Refill   albuterol (VENTOLIN HFA) 108 (90 Base) MCG/ACT inhaler INHALE 1 TO 2 PUFFS INTO THE LUNGS EVERY 6 HOURS AS NEEDED FOR WHEEZING OR SHORTNESS OF BREATH (Patient  taking differently: Inhale 1-2 puffs into the lungs every 6 (six) hours as needed for wheezing or shortness of breath.) 18 g 1   aspirin EC 81 MG tablet Take 1 tablet (81 mg total) by mouth daily. Swallow whole. 150 tablet 2   cholecalciferol (VITAMIN D3) 25 MCG (1000 UNIT) tablet Take 1 tablet (1,000 Units total) by mouth daily. 90 tablet 1   ibuprofen (MOTRIN IB) 200 MG tablet Take 1 tablet (200 mg total) by mouth every 6 (six) hours as needed. 120 tablet 0   levothyroxine (SYNTHROID) 100 MCG tablet Take 1 tablet (100 mcg total) by mouth daily before breakfast. 90 tablet 3   loratadine (CLARITIN) 10 MG tablet Take 1 tablet (10 mg total) by mouth daily. 90 tablet 3   metFORMIN (GLUCOPHAGE-XR) 500 MG 24 hr tablet TAKE 2 TABLETS BY MOUTH TWICE DAILY WITH BREAKFAST AND DINNER 360 tablet 3   metoprolol succinate (TOPROL-XL) 100 MG 24 hr tablet TAKE 1 TABLET BY MOUTH EVERY DAY WITH OR IMMEDIATELY FOLLOWING A MEAL 90 tablet 1   mometasone (NASONEX) 50 MCG/ACT nasal spray Place 2 sprays into the nose daily. 17 g 1   Olmesartan-amLODIPine-HCTZ 40-10-25 MG TABS Take 1 tablet by mouth daily. 90 tablet 1   polyethylene glycol (MIRALAX) 17 g packet Take 17 g by mouth daily as needed for moderate constipation. 14 each 0   rosuvastatin (CRESTOR) 20 MG tablet TAKE 1 TABLET(20 MG) BY MOUTH DAILY 90 tablet 1   No current facility-administered medications on file prior to visit.    ALLERGIES: Allergies  Allergen Reactions   Oysters [Shellfish Allergy] Rash    FAMILY HISTORY: Family History  Problem Relation Age of Onset   Diabetes Mother    Stroke Mother    Cancer Mother        mother <50 at diagnosis (breast); h/o oral cancer   Diabetes Father    Heart disease Father    Stroke Father    Hypertension Father    Diabetes Brother    Colon cancer Brother    Diabetes Sister    Diabetes Paternal Uncle    Cancer Brother        lung   Cancer Brother        throat   Esophageal cancer Neg Hx    Rectal  cancer Neg Hx    Stomach cancer Neg Hx       Objective:  Blood pressure (!) 148/74, pulse 69, resp. rate 18, height '5\' 1"'$  (1.549 m), weight 173 lb (78.5 kg), SpO2 (!) 10 %. General: No acute distress.  Patient appears well-groomed.   Head:  Normocephalic/atraumatic Eyes:  Fundi examined but not visualized Neck: supple, no paraspinal tenderness, full range of motion Heart:  Regular rate and rhythm Neurological Exam: alert and oriented to person, place, and time.  Speech fluent and not dysarthric, language intact.  CN II-XII intact. Bulk and tone normal, muscle strength 5/5 throughout.  Sensation to light touch intact.  Deep tendon reflexes 2+ throughout.  Finger to  nose testing intact.  Gait normal, Romberg negative.   Metta Clines, DO  CC: Lottie Mussel, MD

## 2022-10-25 ENCOUNTER — Encounter: Payer: Self-pay | Admitting: Neurology

## 2022-10-25 ENCOUNTER — Ambulatory Visit: Payer: 59 | Admitting: Neurology

## 2022-10-25 VITALS — BP 148/74 | HR 69 | Resp 18 | Ht 61.0 in | Wt 173.0 lb

## 2022-10-25 DIAGNOSIS — I10 Essential (primary) hypertension: Secondary | ICD-10-CM

## 2022-10-25 DIAGNOSIS — E785 Hyperlipidemia, unspecified: Secondary | ICD-10-CM

## 2022-10-25 DIAGNOSIS — E1169 Type 2 diabetes mellitus with other specified complication: Secondary | ICD-10-CM

## 2022-10-25 DIAGNOSIS — H4912 Fourth [trochlear] nerve palsy, left eye: Secondary | ICD-10-CM

## 2022-10-25 NOTE — Patient Instructions (Addendum)
Continue aspirin '81mg'$  daily Blood pressure control Blood sugar control Cholesterol control

## 2022-11-09 ENCOUNTER — Telehealth: Payer: Self-pay

## 2022-11-09 NOTE — Telephone Encounter (Signed)
Called to confirm participation in next PREP class at [facility] on April 9th, assessment visit scheduled for April 2 at 12 noon

## 2022-11-16 ENCOUNTER — Telehealth: Payer: Self-pay

## 2022-11-16 NOTE — Telephone Encounter (Signed)
Was scheduled for PREP assessment visit today, when she did not show for her appointment, called her; she now has decided she wants to go to [facility] closer to where she lives; explained that [facility] offered [facility]. She states the [facility] is too far away for her to drive so has decided not to attend the program.

## 2022-12-28 NOTE — Progress Notes (Unsigned)
[facility]: Clinic Note  Subjective:  History of Present Illness: Madison Jefferson is a 72 y.o. year old female who presents for routine follow up of her chronic medical conditions. I saw her in February.  Since then, she saw Neurology for L 4th nerve palsy. They recommend current mgmt with aspirin, statin, BP and DM control. Continue aspirin, per neuro  She has no questions or concerns today.  Last time, I had referred her to PT for R shoulder pain. She has been exercising it, and pain is much better. Still gets sore when the weather changes.     Please refer to Assessment and Plan below for full details in Problem-Based Charting.   Past Medical History:  Patient Active Problem List   Diagnosis Date Noted   Fibromuscular dysplasia of both carotid arteries (HCC) 12/29/2022   Healthcare maintenance 09/29/2022   Benign neoplasm of stomach 09/28/2022   HLD (hyperlipidemia) 02/02/2022   Thrombocytopenia (HCC) 02/02/2022   Gastric polyp    Right abducens nerve palsy 11/15/2019   Osteoarthritis of left glenohumeral joint 06/13/2016   Degenerative arthritis of thumb, left 06/01/2016   Type 2 diabetes mellitus (HCC) 09/20/2013   Diverticulosis    Obesity, unspecified 10/12/2012   Allergic rhinitis 10/21/2009   Asthma 07/23/2009   Hypothyroidism 09/05/2008   Essential hypertension 09/25/2007   BACK PAIN WITH RADICULOPATHY 09/25/2007     Medications:  Current Outpatient Medications:    glucose blood test strip, Use as instructed, Disp: 100 each, Rfl: 12   albuterol (VENTOLIN HFA) 108 (90 Base) MCG/ACT inhaler, INHALE 1 TO 2 PUFFS INTO THE LUNGS EVERY 6 HOURS AS NEEDED FOR WHEEZING OR SHORTNESS OF BREATH (Patient taking differently: Inhale 1-2 puffs into the lungs every 6 (six) hours as needed for wheezing or shortness of breath.), Disp: 18 g, Rfl: 1   aspirin EC 81 MG tablet, Take 1 tablet (81 mg total) by mouth daily. Swallow whole., Disp: 150 tablet, Rfl: 2    levothyroxine (SYNTHROID) 100 MCG tablet, Take 1 tablet (100 mcg total) by mouth daily before breakfast., Disp: 90 tablet, Rfl: 3   loratadine (CLARITIN) 10 MG tablet, Take 1 tablet (10 mg total) by mouth daily., Disp: 90 tablet, Rfl: 3   metFORMIN (GLUCOPHAGE-XR) 500 MG 24 hr tablet, TAKE 2 TABLETS BY MOUTH TWICE DAILY WITH BREAKFAST AND DINNER, Disp: 360 tablet, Rfl: 3   metoprolol succinate (TOPROL-XL) 100 MG 24 hr tablet, TAKE 1 TABLET BY MOUTH EVERY DAY WITH OR IMMEDIATELY FOLLOWING A MEAL, Disp: 90 tablet, Rfl: 1   mometasone (NASONEX) 50 MCG/ACT nasal spray, Place 2 sprays into the nose daily., Disp: 17 g, Rfl: 1   Olmesartan-amLODIPine-HCTZ 40-10-25 MG TABS, Take 1 tablet by mouth daily., Disp: 90 tablet, Rfl: 1   polyethylene glycol (MIRALAX) 17 g packet, Take 17 g by mouth daily as needed for moderate constipation., Disp: 14 each, Rfl: 0   rosuvastatin (CRESTOR) 20 MG tablet, TAKE 1 TABLET(20 MG) BY MOUTH DAILY, Disp: 90 tablet, Rfl: 1   Allergies: Allergies  Allergen Reactions   Oysters [Shellfish Allergy] Rash      Objective:   Vitals: Vitals:   12/29/22 1005 12/29/22 1032  BP: (!) 151/56 (!) 143/57  Pulse: 73 69  Temp: 98 F (36.7 C)   SpO2: 100%      Physical Exam: Physical Exam Constitutional:      Appearance: Normal appearance.  Cardiovascular:     Pulses: Normal pulses.     Heart sounds:  Normal heart sounds. No murmur heard. Pulmonary:     Effort: Pulmonary effort is normal.     Breath sounds: Normal breath sounds. No wheezing.  Musculoskeletal:     Right lower leg: No edema.     Left lower leg: No edema.  Skin:    General: Skin is warm and dry.  Neurological:     Mental Status: She is alert.  Psychiatric:        Mood and Affect: Mood normal.        Behavior: Behavior normal.      Data: Labs, imaging, and micro were reviewed in Epic. Refer to Assessment and Plan below for full details in Problem-Based Charting.  Assessment &  Plan:  Hypothyroidism - chronic and stable, with no current symptoms - TSH today  Essential hypertension - I counseled her that as she gets older, her systolic BP may increase, but we also need to balance this with her lower diastolic BP. She has not had any falls or dizziness. Since DBP<60 on 2 readings, I am not increasing any of her blood pressure medicines. -  Continue Olmesartan-Amlodipine-HCTZ 40-10-25 daily and Metoprolol XL 100mg  daily - BMP today  Allergic rhinitis - Continue Claritin daily  Type 2 diabetes mellitus (HCC) - Continue Metformin 500mg  BID. We can try to taper this at next visit - Patient will see eye doctor Dr. Dione Booze in July - Foot exam normal today  HLD (hyperlipidemia) - Continue rosuvastatin 20mg  daily  Thrombocytopenia (HCC) - Platelet count has varied a lot over the years, will repeat CBC today  Fibromuscular dysplasia of both carotid arteries (HCC) - I agree with neuro's plan to continue aspirin 81mg  daily, crestor 20mg  daily, and close HTN & DM control  Healthcare maintenance - I counseled her to get Shingrix vaccine x2 at pharmacy     Patient will follow up in 6 months. Repeat A1C then.   Mercie Eon, MD

## 2022-12-29 ENCOUNTER — Ambulatory Visit (INDEPENDENT_AMBULATORY_CARE_PROVIDER_SITE_OTHER): Payer: 59 | Admitting: Internal Medicine

## 2022-12-29 ENCOUNTER — Encounter: Payer: Self-pay | Admitting: Internal Medicine

## 2022-12-29 VITALS — BP 143/57 | HR 69 | Temp 98.0°F | Ht 61.0 in | Wt 175.7 lb

## 2022-12-29 DIAGNOSIS — Z7984 Long term (current) use of oral hypoglycemic drugs: Secondary | ICD-10-CM | POA: Diagnosis not present

## 2022-12-29 DIAGNOSIS — E785 Hyperlipidemia, unspecified: Secondary | ICD-10-CM | POA: Diagnosis not present

## 2022-12-29 DIAGNOSIS — I10 Essential (primary) hypertension: Secondary | ICD-10-CM | POA: Diagnosis not present

## 2022-12-29 DIAGNOSIS — I773 Arterial fibromuscular dysplasia: Secondary | ICD-10-CM | POA: Diagnosis not present

## 2022-12-29 DIAGNOSIS — D696 Thrombocytopenia, unspecified: Secondary | ICD-10-CM | POA: Diagnosis not present

## 2022-12-29 DIAGNOSIS — E1169 Type 2 diabetes mellitus with other specified complication: Secondary | ICD-10-CM

## 2022-12-29 DIAGNOSIS — J301 Allergic rhinitis due to pollen: Secondary | ICD-10-CM | POA: Diagnosis not present

## 2022-12-29 DIAGNOSIS — E039 Hypothyroidism, unspecified: Secondary | ICD-10-CM

## 2022-12-29 DIAGNOSIS — Z0000 Encounter for general adult medical examination without abnormal findings: Secondary | ICD-10-CM

## 2022-12-29 LAB — POCT GLYCOSYLATED HEMOGLOBIN (HGB A1C): Hemoglobin A1C: 6.1 % — AB (ref 4.0–5.6)

## 2022-12-29 LAB — GLUCOSE, CAPILLARY: Glucose-Capillary: 190 mg/dL — ABNORMAL HIGH (ref 70–99)

## 2022-12-29 MED ORDER — GLUCOSE BLOOD VI STRP: ORAL_STRIP | 12 refills | Status: DC

## 2022-12-29 MED ORDER — MOMETASONE FUROATE 50 MCG/ACT NA SUSP
2.0000 | Freq: Every day | NASAL | 3 refills | Status: DC
Start: 2022-12-29 — End: 2023-11-11

## 2022-12-29 MED ORDER — GLUCOSE BLOOD VI STRP: ORAL_STRIP | 3 refills | Status: AC

## 2022-12-29 MED ORDER — MOMETASONE FUROATE 50 MCG/ACT NA SUSP: 2.0000 | Freq: Every day | NASAL | 1 refills | Status: DC

## 2022-12-29 NOTE — Assessment & Plan Note (Signed)
-   I counseled her to get Shingrix vaccine x2 at pharmacy

## 2022-12-29 NOTE — Assessment & Plan Note (Signed)
-   chronic and stable, with no current symptoms - TSH today

## 2022-12-29 NOTE — Assessment & Plan Note (Addendum)
-   I counseled her that as she gets older, her systolic BP may increase, but we also need to balance this with her lower diastolic BP. She has not had any falls or dizziness. Since DBP<60 on 2 readings, I am not increasing any of her blood pressure medicines. -  Continue Olmesartan-Amlodipine-HCTZ 40-10-25 daily and Metoprolol XL 100mg  daily - BMP today

## 2022-12-29 NOTE — Assessment & Plan Note (Signed)
Continue Claritin daily 

## 2022-12-29 NOTE — Addendum Note (Signed)
Addended by: Derrek Monaco on: 12/29/2022 11:17 AM   Modules accepted: Orders

## 2022-12-29 NOTE — Assessment & Plan Note (Signed)
-   I agree with neuro's plan to continue aspirin 81mg  daily, crestor 20mg  daily, and close HTN & DM control

## 2022-12-29 NOTE — Patient Instructions (Addendum)
Dear Ms Evjen,  It was a pleasure seeing you in clinic today.  Your A1C looks excellent today at 6.1. Your foot exam was normal.   You can stop your Vitamin D supplements - you no longer need these.   Otherwise, I did not make any changes to your medicines.  I will call you with your lab results from today.  I'll see you back in 6 months - please call us if you need anything before then.  Sincerely, Dr. Mercie Eon

## 2022-12-29 NOTE — Assessment & Plan Note (Signed)
-   Platelet count has varied a lot over the years, will repeat CBC today

## 2022-12-29 NOTE — Assessment & Plan Note (Signed)
Continue rosuvastatin 20 mg daily. 

## 2022-12-29 NOTE — Assessment & Plan Note (Signed)
-   Continue Metformin 500mg  BID. We can try to taper this at next visit - Patient will see eye doctor Dr. Dione Booze in July - Foot exam normal today

## 2022-12-30 LAB — BMP8+ANION GAP
Anion Gap: 19 mmol/L — ABNORMAL HIGH (ref 10.0–18.0)
BUN/Creatinine Ratio: 17 (ref 12–28)
BUN: 18 mg/dL (ref 8–27)
CO2: 23 mmol/L (ref 20–29)
Calcium: 9.4 mg/dL (ref 8.7–10.3)
Chloride: 100 mmol/L (ref 96–106)
Creatinine, Ser: 1.04 mg/dL — ABNORMAL HIGH (ref 0.57–1.00)
Glucose: 208 mg/dL — ABNORMAL HIGH (ref 70–99)
Potassium: 4.1 mmol/L (ref 3.5–5.2)
Sodium: 142 mmol/L (ref 134–144)
eGFR: 56 mL/min/{1.73_m2} — ABNORMAL LOW (ref >59)

## 2022-12-30 LAB — CBC
Hematocrit: 37.7 % (ref 34.0–46.6)
Hemoglobin: 12.7 g/dL (ref 11.1–15.9)
MCH: 27.8 pg (ref 26.6–33.0)
MCHC: 33.7 g/dL (ref 31.5–35.7)
MCV: 83 fL (ref 79–97)
Platelets: 189 10*3/uL (ref 150–450)
RBC: 4.57 x10E6/uL (ref 3.77–5.28)
RDW: 12.9 % (ref 11.7–15.4)
WBC: 6.9 10*3/uL (ref 3.4–10.8)

## 2022-12-30 LAB — TSH: TSH: 0.026 u[IU]/mL — ABNORMAL LOW (ref 0.450–4.500)

## 2022-12-31 MED ORDER — LEVOTHYROXINE SODIUM 88 MCG PO TABS
88.0000 ug | ORAL_TABLET | Freq: Every day | ORAL | 3 refills | Status: DC
Start: 2022-12-31 — End: 2023-02-18

## 2022-12-31 NOTE — Addendum Note (Signed)
Addended by: Bufford Spikes on: 12/31/2022 12:50 PM   Modules accepted: Orders

## 2022-12-31 NOTE — Addendum Note (Signed)
Addended by: Mercie Eon L on: 12/31/2022 09:00 AM   Modules accepted: Orders

## 2023-01-03 ENCOUNTER — Other Ambulatory Visit: Payer: Self-pay | Admitting: Internal Medicine

## 2023-01-03 DIAGNOSIS — I10 Essential (primary) hypertension: Secondary | ICD-10-CM

## 2023-01-06 ENCOUNTER — Other Ambulatory Visit: Payer: Self-pay | Admitting: Internal Medicine

## 2023-01-06 DIAGNOSIS — Z1231 Encounter for screening mammogram for malignant neoplasm of breast: Secondary | ICD-10-CM

## 2023-01-19 ENCOUNTER — Ambulatory Visit (INDEPENDENT_AMBULATORY_CARE_PROVIDER_SITE_OTHER): Payer: 59

## 2023-01-19 VITALS — Ht 61.0 in | Wt 175.0 lb

## 2023-01-19 DIAGNOSIS — Z0000 Encounter for general adult medical examination without abnormal findings: Secondary | ICD-10-CM | POA: Diagnosis not present

## 2023-01-19 NOTE — Patient Instructions (Signed)
Madison Jefferson , Thank you for taking time to come for your Medicare Wellness Visit. I appreciate your ongoing commitment to your health goals. Please review the following plan we discussed and let me know if I can assist you in the future.   These are the goals we discussed:  Goals      Blood Pressure < 140/90     Remain active and indpendent     Weight < 150 lb (68.04 kg)        This is a list of the screening recommended for you and due dates:  Health Maintenance  Topic Date Due   Zoster (Shingles) Vaccine (1 of 2) Never done   COVID-19 Vaccine (3 - 2023-24 season) 04/16/2022   Eye exam for diabetics  07/24/2022   Flu Shot  03/17/2023   Hemoglobin A1C  07/01/2023   Yearly kidney health urinalysis for diabetes  09/30/2023   Yearly kidney function blood test for diabetes  12/29/2023   Complete foot exam   12/29/2023   Medicare Annual Wellness Visit  01/19/2024   Mammogram  02/12/2024   DTaP/Tdap/Td vaccine (2 - Td or Tdap) 12/09/2025   Colon Cancer Screening  03/18/2032   Pneumonia Vaccine  Completed   DEXA scan (bone density measurement)  Completed   Hepatitis C Screening  Completed   HPV Vaccine  Aged Out    Advanced directives: Information on Advanced Care Planning can be found at [facility] Advance Health Care Directives Advance Health Care Directives ([email or web address]  Please bring a copy of your health care power of attorney and living will to the office to be added to your chart at your convenience.   Conditions/risks identified: Aim for 30 minutes of exercise or brisk walking, 6-8 glasses of water, and 5 servings of fruits and vegetables each day.   Next appointment: Follow up in one year for your annual wellness visit   The contact information for Dr. [name] is [phone]   Preventive Care 65 Years and Older, Female Preventive care refers to lifestyle choices and visits with your health care provider that can promote health and wellness. What  does preventive care include? A yearly physical exam. This is also called an annual well check. Dental exams once or twice a year. Routine eye exams. Ask your health care provider how often you should have your eyes checked. Personal lifestyle choices, including: Daily care of your teeth and gums. Regular physical activity. Eating a healthy diet. Avoiding tobacco and drug use. Limiting alcohol use. Practicing safe sex. Taking low-dose aspirin every day. Taking vitamin and mineral supplements as recommended by your health care provider. What happens during an annual well check? The services and screenings done by your health care provider during your annual well check will depend on your age, overall health, lifestyle risk factors, and family history of disease. Counseling  Your health care provider may ask you questions about your: Alcohol use. Tobacco use. Drug use. Emotional well-being. Home and relationship well-being. Sexual activity. Eating habits. History of falls. Memory and ability to understand (cognition). Work and work Astronomer. Reproductive health. Screening  You may have the following tests or measurements: Height, weight, and BMI. Blood pressure. Lipid and cholesterol levels. These may be checked every 5 years, or more frequently if you are over 14 years old. Skin check. Lung cancer screening. You may have this screening every year starting at age 74 if you have a 30-pack-year history of smoking and currently smoke or  have quit within the past 15 years. Fecal occult blood test (FOBT) of the stool. You may have this test every year starting at age 55. Flexible sigmoidoscopy or colonoscopy. You may have a sigmoidoscopy every 5 years or a colonoscopy every 10 years starting at age 48. Hepatitis C blood test. Hepatitis B blood test. Sexually transmitted disease (STD) testing. Diabetes screening. This is done by checking your blood sugar (glucose) after you have  not eaten for a while (fasting). You may have this done every 1-3 years. Bone density scan. This is done to screen for osteoporosis. You may have this done starting at age 74. Mammogram. This may be done every 1-2 years. Talk to your health care provider about how often you should have regular mammograms. Talk with your health care provider about your test results, treatment options, and if necessary, the need for more tests. Vaccines  Your health care provider may recommend certain vaccines, such as: Influenza vaccine. This is recommended every year. Tetanus, diphtheria, and acellular pertussis (Tdap, Td) vaccine. You may need a Td booster every 10 years. Zoster vaccine. You may need this after age 2. Pneumococcal 13-valent conjugate (PCV13) vaccine. One dose is recommended after age 74. Pneumococcal polysaccharide (PPSV23) vaccine. One dose is recommended after age 14. Talk to your health care provider about which screenings and vaccines you need and how often you need them. This information is not intended to replace advice given to you by your health care provider. Make sure you discuss any questions you have with your health care provider. Document Released: 08/29/2015 Document Revised: 04/21/2016 Document Reviewed: 06/03/2015 Elsevier Interactive Patient Education  2017 [employer].  Fall Prevention in the Home Falls can cause injuries. They can happen to people of all ages. There are many things you can do to make your home safe and to help prevent falls. What can I do on the outside of my home? Regularly fix the edges of walkways and driveways and fix any cracks. Remove anything that might make you trip as you walk through a door, such as a raised step or threshold. Trim any bushes or trees on the path to your home. Use bright outdoor lighting. Clear any walking paths of anything that might make someone trip, such as rocks or tools. Regularly check to see if handrails are loose or  broken. Make sure that both sides of any steps have handrails. Any raised decks and porches should have guardrails on the edges. Have any leaves, snow, or ice cleared regularly. Use sand or salt on walking paths during winter. Clean up any spills in your garage right away. This includes oil or grease spills. What can I do in the bathroom? Use night lights. Install grab bars by the toilet and in the tub and shower. Do not use towel bars as grab bars. Use non-skid mats or decals in the tub or shower. If you need to sit down in the shower, use a plastic, non-slip stool. Keep the floor dry. Clean up any water that spills on the floor as soon as it happens. Remove soap buildup in the tub or shower regularly. Attach bath mats securely with double-sided non-slip rug tape. Do not have throw rugs and other things on the floor that can make you trip. What can I do in the bedroom? Use night lights. Make sure that you have a light by your bed that is easy to reach. Do not use any sheets or blankets that are too big for your  bed. They should not hang down onto the floor. Have a firm chair that has side arms. You can use this for support while you get dressed. Do not have throw rugs and other things on the floor that can make you trip. What can I do in the kitchen? Clean up any spills right away. Avoid walking on wet floors. Keep items that you use a lot in easy-to-reach places. If you need to reach something above you, use a strong step stool that has a grab bar. Keep electrical cords out of the way. Do not use floor polish or wax that makes floors slippery. If you must use wax, use non-skid floor wax. Do not have throw rugs and other things on the floor that can make you trip. What can I do with my stairs? Do not leave any items on the stairs. Make sure that there are handrails on both sides of the stairs and use them. Fix handrails that are broken or loose. Make sure that handrails are as long as  the stairways. Check any carpeting to make sure that it is firmly attached to the stairs. Fix any carpet that is loose or worn. Avoid having throw rugs at the top or bottom of the stairs. If you do have throw rugs, attach them to the floor with carpet tape. Make sure that you have a light switch at the top of the stairs and the bottom of the stairs. If you do not have them, ask someone to add them for you. What else can I do to help prevent falls? Wear shoes that: Do not have high heels. Have rubber bottoms. Are comfortable and fit you well. Are closed at the toe. Do not wear sandals. If you use a stepladder: Make sure that it is fully opened. Do not climb a closed stepladder. Make sure that both sides of the stepladder are locked into place. Ask someone to hold it for you, if possible. Clearly mark and make sure that you can see: Any grab bars or handrails. First and last steps. Where the edge of each step is. Use tools that help you move around (mobility aids) if they are needed. These include: Canes. Walkers. Scooters. Crutches. Turn on the lights when you go into a dark area. Replace any light bulbs as soon as they burn out. Set up your furniture so you have a clear path. Avoid moving your furniture around. If any of your floors are uneven, fix them. If there are any pets around you, be aware of where they are. Review your medicines with your doctor. Some medicines can make you feel dizzy. This can increase your chance of falling. Ask your doctor what other things that you can do to help prevent falls. This information is not intended to replace advice given to you by your health care provider. Make sure you discuss any questions you have with your health care provider. Document Released: 05/29/2009 Document Revised: 01/08/2016 Document Reviewed: 09/06/2014 Elsevier Interactive Patient Education  2017 [employer].

## 2023-01-19 NOTE — Progress Notes (Signed)
Subjective:   Madison Jefferson is a 72 y.o. female who presents for Medicare Annual (Subsequent) preventive examination.  I connected with  [name] on 01/19/23 by a audio enabled telemedicine application and verified that I am speaking with the correct person using two identifiers.  Patient Location: Home  Provider Location: Home Office  I discussed the limitations of evaluation and management by telemedicine. The patient expressed understanding and agreed to proceed.  Review of Systems     Cardiac Risk Factors include: diabetes mellitus;advanced age (>7men, >75 women);hypertension;dyslipidemia;sedentary lifestyle     Objective:    Today's Vitals   01/19/23 1840  Weight: 175 lb (79.4 kg)  Height: 5\' 1"  (1.549 m)   Body mass index is 33.07 kg/m.     01/19/2023    6:45 PM 12/29/2022   10:12 AM 10/25/2022   10:20 AM 09/29/2022   10:29 AM 04/27/2022   11:16 AM 04/26/2022   10:44 AM 02/02/2022   11:08 AM  Advanced Directives  Does Patient Have a Medical Advance Directive? No No No No No No No  Would patient like information on creating a medical advance directive? Yes (MAU/Ambulatory/Procedural Areas - Information given) No - Patient declined  No - Patient declined No - Patient declined  No - Patient declined    Current Medications (verified) Outpatient Encounter Medications as of 01/19/2023  Medication Sig   albuterol (VENTOLIN HFA) 108 (90 Base) MCG/ACT inhaler INHALE 1 TO 2 PUFFS INTO THE LUNGS EVERY 6 HOURS AS NEEDED FOR WHEEZING OR SHORTNESS OF BREATH (Patient taking differently: Inhale 1-2 puffs into the lungs every 6 (six) hours as needed for wheezing or shortness of breath.)   aspirin EC 81 MG tablet Take 1 tablet (81 mg total) by mouth daily. Swallow whole.   glucose blood test strip Check sugars daily.   levothyroxine (SYNTHROID) 88 MCG tablet Take 1 tablet (88 mcg total) by mouth daily before breakfast.   loratadine (CLARITIN) 10 MG tablet Take 1 tablet (10 mg total)  by mouth daily.   metFORMIN (GLUCOPHAGE-XR) 500 MG 24 hr tablet TAKE 2 TABLETS BY MOUTH TWICE DAILY WITH BREAKFAST AND DINNER   metoprolol succinate (TOPROL-XL) 100 MG 24 hr tablet TAKE 1 TABLET BY MOUTH EVERY DAY WITH OR IMMEDIATELY FOLLOWING A MEAL   mometasone (NASONEX) 50 MCG/ACT nasal spray Place 2 sprays into the nose daily.   Olmesartan-amLODIPine-HCTZ 40-10-25 MG TABS Take 1 tablet by mouth daily.   polyethylene glycol (MIRALAX) 17 g packet Take 17 g by mouth daily as needed for moderate constipation.   rosuvastatin (CRESTOR) 20 MG tablet TAKE 1 TABLET(20 MG) BY MOUTH DAILY   No facility-administered encounter medications on file as of 01/19/2023.    Allergies (verified) Oysters [shellfish allergy]   History: Past Medical History:  Diagnosis Date   Allergy    Arthritis    Asthma    Atrial fibrillation (HCC)    h/o of isolated AF associated with hypothyroidism    Diverticulosis    cecum   Diverticulosis    colonoscopy 2002 [name]   Dysphagia 06/23/2021   Fatty liver    Fatty liver 10/29/2009   Qualifier: Diagnosis of   By: Huntley Dec, Scott       GERD 10/21/2009   Qualifier: Diagnosis of   By: Huntley Dec, Scott       GERD (gastroesophageal reflux disease)    Hypertension    Lumbar radiculopathy    Thyroid disease    hypothyroid  TIA (transient ischemic attack)    2006   Urinary incontinence    mixed urge and stress    Vitamin D deficiency    Vitamin D deficiency 12/24/2009   Qualifier: Diagnosis of   By: Brynda Rim       Past Surgical History:  Procedure Laterality Date   ABLATION     thyroid with radioactive iodine ablation   CARPAL TUNNEL RELEASE     left wrist   CHOLECYSTECTOMY     COLONOSCOPY WITH PROPOFOL N/A 01/28/2022   Procedure: COLONOSCOPY WITH PROPOFOL;  Surgeon: [name], MD;  Location: [facility] ENDOSCOPY;  Service: Gastroenterology;  Laterality: N/A;   ESOPHAGOGASTRODUODENOSCOPY (EGD) WITH PROPOFOL N/A 01/28/2022   Procedure:  ESOPHAGOGASTRODUODENOSCOPY (EGD) WITH PROPOFOL;  Surgeon: [name], MD;  Location: [facility] ENDOSCOPY;  Service: Gastroenterology;  Laterality: N/A;   ESOPHAGOGASTRODUODENOSCOPY (EGD) WITH PROPOFOL N/A 01/29/2022   Procedure: ESOPHAGOGASTRODUODENOSCOPY (EGD) WITH PROPOFOL;  Surgeon: [name], MD;  Location: [facility] ENDOSCOPY;  Service: Gastroenterology;  Laterality: N/A;   HEMOSTASIS CLIP PLACEMENT  01/28/2022   Procedure: HEMOSTASIS CLIP PLACEMENT;  Surgeon: [name], MD;  Location: [facility] ENDOSCOPY;  Service: Gastroenterology;;   HEMOSTASIS CLIP PLACEMENT  01/29/2022   Procedure: HEMOSTASIS CLIP PLACEMENT;  Surgeon: [name], MD;  Location: [facility] ENDOSCOPY;  Service: Gastroenterology;;   HEMOSTASIS CONTROL  01/28/2022   Procedure: HEMOSTASIS CONTROL - EPI;  Surgeon: [name], MD;  Location: [facility] ENDOSCOPY;  Service: Gastroenterology;;   LEFT HEART CATHETERIZATION WITH CORONARY ANGIOGRAM N/A 01/28/2012   Procedure: LEFT HEART CATHETERIZATION WITH CORONARY ANGIOGRAM;  Surgeon: Ricki Rodriguez, MD;  Location: [facility] CATH LAB;  Service: Cardiovascular;  Laterality: N/A;   OTHER SURGICAL HISTORY     carpel tunnel surgery    OTHER SURGICAL HISTORY     carpel tunnel repair   POLYPECTOMY  01/28/2022   Procedure: POLYPECTOMY;  Surgeon: [name], MD;  Location: [facility] ENDOSCOPY;  Service: Gastroenterology;;   POLYPECTOMY  01/29/2022   Procedure: POLYPECTOMY;  Surgeon: [name], MD;  Location: [facility] ENDOSCOPY;  Service: Gastroenterology;;   RIGHT HEART CATH  2005-Dr. Algie Coffer   minimal CAD; left circumflex dominant with 10-15% proximal lesion;; normal LVF   TUBAL LIGATION     VAGINAL HYSTERECTOMY     partial hysterectomy 1983 vaginal bleeding   Family History  Problem Relation Age of Onset   Diabetes Mother    Stroke Mother    Cancer Mother        mother <50 at diagnosis (breast); h/o oral cancer   Diabetes Father    Heart disease Father    Stroke Father    Hypertension Father    Diabetes  Brother    Colon cancer Brother    Diabetes Sister    Diabetes Paternal Uncle    Cancer Brother        lung   Cancer Brother        throat   Esophageal cancer Neg Hx    Rectal cancer Neg Hx    Stomach cancer Neg Hx    Social History   Socioeconomic History   Marital status: Divorced    Spouse name: Not on file   Number of children: Not on file   Years of education: Not on file   Highest education level: Not on file  Occupational History   Not on file  Tobacco Use   Smoking status: Never   Smokeless tobacco: Never  Substance and Sexual Activity   Alcohol use: No    Alcohol/week: 0.0  standard drinks of alcohol   Drug use: No   Sexual activity: Yes  Other Topics Concern   Not on file  Social History Narrative   Right handed   Lives alone in a two story   Social Determinants of Health   Financial Resource Strain: Low Risk  (01/19/2023)   Overall Financial Resource Strain (CARDIA)    Difficulty of Paying Living Expenses: Not hard at all  Food Insecurity: No Food Insecurity (01/19/2023)   Hunger Vital Sign    Worried About Running Out of Food in the Last Year: Never true    Ran Out of Food in the Last Year: Never true  Transportation Needs: No Transportation Needs (01/19/2023)   PRAPARE - Administrator, Civil Service (Medical): No    Lack of Transportation (Non-Medical): No  Physical Activity: Inactive (01/19/2023)   Exercise Vital Sign    Days of Exercise per Week: 0 days    Minutes of Exercise per Session: 0 min  Stress: No Stress Concern Present (01/19/2023)   [employer] of Occupational Health - Occupational Stress Questionnaire    Feeling of Stress : Not at all  Social Connections: Moderately Integrated (01/19/2023)   Social Connection and Isolation Panel [NHANES]    Frequency of Communication with Friends and Family: More than three times a week    Frequency of Social Gatherings with Friends and Family: Twice a week    Attends Religious Services: More  than 4 times per year    Active Member of [employer] or Organizations: Yes    Attends Engineer, structural: More than 4 times per year    Marital Status: Divorced    Tobacco Counseling Counseling given: Not Answered   Clinical Intake:  Pre-visit preparation completed: Yes  Pain : No/denies pain  Diabetes: Yes CBG done?: No Did pt. bring in CBG monitor from home?: No  How often do you need to have someone help you when you read instructions, pamphlets, or other written materials from your doctor or pharmacy?: 1 - Never  Diabetic?Yes   Nutrition Risk Assessment:  Has the patient had any N/V/D within the last 2 months?  No  Does the patient have any non-healing wounds?  No  Has the patient had any unintentional weight loss or weight gain?  No   Diabetes:  Is the patient diabetic?  Yes  If diabetic, was a CBG obtained today?  No  Did the patient bring in their glucometer from home?  No  How often do you monitor your CBG's? Daily .   Financial Strains and Diabetes Management:  Are you having any financial strains with the device, your supplies or your medication? No .  Does the patient want to be seen by Chronic Care Management for management of their diabetes?  No  Would the patient like to be referred to a Nutritionist or for Diabetic Management?  No   Diabetic Exams:  Diabetic Eye Exam: Completed records requested Diabetic Foot Exam: Completed 12/29/22   Interpreter Needed?: No  Information entered by :: Kandis Fantasia LPN   Activities of Daily Living    01/19/2023    6:45 PM 12/29/2022   10:11 AM  In your present state of health, do you have any difficulty performing the following activities:  Hearing? 0 0  Vision? 0 0  Difficulty concentrating or making decisions? 0 0  Walking or climbing stairs? 0 0  Dressing or bathing? 0 0  Doing errands, shopping? 0 0  Preparing Food and eating ? N   Using the Toilet? N   In the past six months, have you  accidently leaked urine? N   Do you have problems with loss of bowel control? N   Managing your Medications? N   Managing your Finances? N   Housekeeping or managing your Housekeeping? N     Patient Care Team: Mercie Eon, MD as PCP - General (Internal Medicine) Earl Lagos, MD (Internal Medicine) [facility] Drema Dallas, DO as Consulting Physician (Neurology)  Indicate any recent Medical Services you may have received from other than [facility] providers in the past year (date may be approximate).     Assessment:   This is a routine wellness examination for Erice.  Hearing/Vision screen Hearing Screening - Comments:: Denies hearing difficulties   Vision Screening - Comments:: up to date with routine eye exams with Dr. Dione Booze    Dietary issues and exercise activities discussed: Current Exercise Habits: The patient does not participate in regular exercise at present   Goals Addressed             This Visit's Progress    Remain active and indpendent        Depression Screen    01/19/2023    6:44 PM 09/29/2022   10:32 AM 04/27/2022   11:17 AM 02/02/2022   11:08 AM 11/06/2021    2:07 PM 11/06/2021    2:02 PM 11/04/2021   10:00 AM  PHQ 2/9 Scores  PHQ - 2 Score 0 0 0 0 0 0 0  PHQ- 9 Score     1      Fall Risk    01/19/2023    6:45 PM 12/29/2022   10:11 AM 10/25/2022   10:20 AM 09/29/2022   10:32 AM 04/26/2022   10:44 AM  Fall Risk   Falls in the past year? 0 0 0 0 0  Number falls in past yr: 0 0 0 0 0  Injury with Fall? 0 0 0 0 0  Risk for fall due to : No Fall Risks      Follow up Falls prevention discussed;Education provided;Falls evaluation completed Falls evaluation completed Falls evaluation completed Falls evaluation completed     FALL RISK PREVENTION PERTAINING TO THE HOME:  Any stairs in or around the home? Yes  If so, are there any without handrails? No  Home free of loose throw rugs in walkways, pet beds, electrical cords, etc? Yes   Adequate lighting in your home to reduce risk of falls? Yes   ASSISTIVE DEVICES UTILIZED TO PREVENT FALLS:  Life alert? No  Use of a cane, walker or w/c? No  Grab bars in the bathroom? Yes  Shower chair or bench in shower? No  Elevated toilet seat or a handicapped toilet? Yes   TIMED UP AND GO:  Was the test performed? No . Telephonic visit   Cognitive Function:        01/19/2023    6:45 PM 11/06/2021    2:08 PM  6CIT Screen  What Year? 0 points 0 points  What month? 0 points 0 points  What time? 0 points 0 points  Count back from 20 0 points 0 points  Months in reverse 0 points 0 points  Repeat phrase 0 points 0 points  Total Score 0 points 0 points    Immunizations Immunization History  Administered Date(s) Administered   Fluad Quad(high Dose 65+) 05/16/2021, 04/27/2022   H1N1 09/04/2008  Influenza Split 07/03/2012   Influenza Whole 07/08/2009   Influenza, High Dose Seasonal PF 06/24/2017, 06/01/2019   Influenza,inj,Quad PF,6+ Mos 09/21/2013, 06/20/2014, 04/18/2018   PFIZER(Purple Top)SARS-COV-2 Vaccination 10/14/2019, 11/07/2019   Pneumococcal Conjugate-13 03/18/2015   Pneumococcal Polysaccharide-23 12/12/2013   Tdap 12/10/2015    TDAP status: Up to date  Pneumococcal vaccine status: Up to date  Covid-19 vaccine status: Information provided on how to obtain vaccines.   Qualifies for Shingles Vaccine? Yes   Zostavax completed No   Shingrix Completed?: No.    Education has been provided regarding the importance of this vaccine. Patient has been advised to call insurance company to determine out of pocket expense if they have not yet received this vaccine. Advised may also receive vaccine at local pharmacy or Health Dept. Verbalized acceptance and understanding.  Screening Tests Health Maintenance  Topic Date Due   Zoster Vaccines- Shingrix (1 of 2) Never done   COVID-19 Vaccine (3 - 2023-24 season) 04/16/2022   OPHTHALMOLOGY EXAM  07/24/2022   INFLUENZA  VACCINE  03/17/2023   HEMOGLOBIN A1C  07/01/2023   Diabetic kidney evaluation - Urine ACR  09/30/2023   Diabetic kidney evaluation - eGFR measurement  12/29/2023   FOOT EXAM  12/29/2023   Medicare Annual Wellness (AWV)  01/19/2024   MAMMOGRAM  02/12/2024   DTaP/Tdap/Td (2 - Td or Tdap) 12/09/2025   Colonoscopy  03/18/2032   Pneumonia Vaccine 33+ Years old  Completed   DEXA SCAN  Completed   Hepatitis C Screening  Completed   HPV VACCINES  Aged Out    Health Maintenance  Health Maintenance Due  Topic Date Due   Zoster Vaccines- Shingrix (1 of 2) Never done   COVID-19 Vaccine (3 - 2023-24 season) 04/16/2022   OPHTHALMOLOGY EXAM  07/24/2022    Colorectal cancer screening: Type of screening: Colonoscopy. Completed 03/18/22. Repeat every 10 years  Mammogram status: Completed 02/11/22. Repeat every year  Bone Density status: Completed 07/02/16. Results reflect: Bone density results: NORMAL. Repeat every 5 years.  Lung Cancer Screening: (Low Dose CT Chest recommended if Age 16-80 years, 30 pack-year currently smoking OR have quit w/in 15years.) does not qualify.   Lung Cancer Screening Referral: n/a  Additional Screening:  Hepatitis C Screening: does qualify; Completed 10/17/09  Vision Screening: Recommended annual ophthalmology exams for early detection of glaucoma and other disorders of the eye. Is the patient up to date with their annual eye exam?  Yes  Who is the provider or what is the name of the office in which the patient attends annual eye exams? [facility]  If pt is not established with a provider, would they like to be referred to a provider to establish care? No .   Dental Screening: Recommended annual dental exams for proper oral hygiene  Community Resource Referral / Chronic Care Management: CRR required this visit?  No   CCM required this visit?  No      Plan:     I have personally reviewed and noted the following in the patient's chart:   Medical and social  history Use of alcohol, tobacco or illicit drugs  Current medications and supplements including opioid prescriptions. Patient is not currently taking opioid prescriptions. Functional ability and status Nutritional status Physical activity Advanced directives List of other physicians Hospitalizations, surgeries, and ER visits in previous 12 months Vitals Screenings to include cognitive, depression, and falls Referrals and appointments  In addition, I have reviewed and discussed with patient certain preventive protocols, quality metrics, and  best practice recommendations. A written personalized care plan for preventive services as well as general preventive health recommendations were provided to patient.     Durwin Nora, [place]   4/0/3474   Due to this being a virtual visit, the after visit summary with patients personalized plan was offered to patient via mail or my-chart. per request, patient was mailed a copy of AVS  Nurse Notes: No concerns

## 2023-01-26 NOTE — Progress Notes (Signed)
Reviewed Dr. Laruth Bouchard note from 02/2022 - at her last visit with him, he sent her to the ER for MRI brain. She is due for routine follow up and has this scheduled

## 2023-01-31 NOTE — Progress Notes (Signed)
I reviewed the annual wellness visit and I agree with the assessment and plan.

## 2023-02-09 ENCOUNTER — Other Ambulatory Visit (INDEPENDENT_AMBULATORY_CARE_PROVIDER_SITE_OTHER): Payer: 59

## 2023-02-09 DIAGNOSIS — E039 Hypothyroidism, unspecified: Secondary | ICD-10-CM

## 2023-02-10 ENCOUNTER — Encounter: Payer: Self-pay | Admitting: Dietician

## 2023-02-10 LAB — TSH: TSH: 0.038 u[IU]/mL — ABNORMAL LOW (ref 0.450–4.500)

## 2023-02-15 ENCOUNTER — Ambulatory Visit
Admission: RE | Admit: 2023-02-15 | Discharge: 2023-02-15 | Disposition: A | Discharge status: Home / Self Care | Payer: 59 | Source: Ambulatory Visit | Attending: Internal Medicine | Admitting: Internal Medicine

## 2023-02-15 DIAGNOSIS — Z1231 Encounter for screening mammogram for malignant neoplasm of breast: Secondary | ICD-10-CM

## 2023-02-18 ENCOUNTER — Other Ambulatory Visit: Payer: Self-pay | Admitting: Internal Medicine

## 2023-02-18 DIAGNOSIS — E039 Hypothyroidism, unspecified: Secondary | ICD-10-CM

## 2023-02-18 MED ORDER — LEVOTHYROXINE SODIUM 75 MCG PO TABS
75.0000 ug | ORAL_TABLET | Freq: Every day | ORAL | 3 refills | Status: DC
Start: 2023-02-18 — End: 2024-02-08

## 2023-02-18 NOTE — Progress Notes (Signed)
Labs reviewed. TSH still a little low. I confirmed that patient is taking the daily and taking correctly. We will decrease to daily - I will send this to her pharmacy now Follow up in 8 weeks   Front desk pool - will you please schedule Madison Jefferson to see me or a resident in 8 weeks? Thank you

## 2023-03-09 DIAGNOSIS — H0288B Meibomian gland dysfunction left eye, upper and lower eyelids: Secondary | ICD-10-CM | POA: Diagnosis not present

## 2023-03-09 DIAGNOSIS — Z961 Presence of intraocular lens: Secondary | ICD-10-CM | POA: Diagnosis not present

## 2023-03-09 DIAGNOSIS — H0288A Meibomian gland dysfunction right eye, upper and lower eyelids: Secondary | ICD-10-CM | POA: Diagnosis not present

## 2023-03-09 DIAGNOSIS — H04123 Dry eye syndrome of bilateral lacrimal glands: Secondary | ICD-10-CM | POA: Diagnosis not present

## 2023-04-05 NOTE — Progress Notes (Unsigned)
[name]: Clinic Note  Subjective:  History of Present Illness: Madison Jefferson is a 72 y.o. year old female who presents for follow up of her chronic medical conditions. I saw her last in May.  She's doing well overall.  Her one concern is that she's having some intermittent muscle cramps in her arms and legs. She wonders if this is related to her cholesterol medicines. No falls.    Please refer to Assessment and Plan below for full details in Problem-Based Charting.   Past Medical History:  Patient Active Problem List   Diagnosis Date Noted   Left-sided fourth cranial nerve palsy 04/06/2023   Fibromuscular dysplasia of both carotid arteries (HCC) 12/29/2022   Healthcare maintenance 09/29/2022   Benign neoplasm of stomach 09/28/2022   HLD (hyperlipidemia) 02/02/2022   Gastric polyp    Right abducens nerve palsy 11/15/2019   Type 2 diabetes mellitus (HCC) 09/20/2013   Diverticulosis    Obesity, unspecified 10/12/2012   Allergic rhinitis 10/21/2009   Asthma 07/23/2009   Hypothyroidism 09/05/2008   Essential hypertension 09/25/2007      Medications:  Current Outpatient Medications:    rosuvastatin (CRESTOR) 10 MG tablet, Take 1 tablet (10 mg total) by mouth daily., Disp: 90 tablet, Rfl: 3   albuterol (VENTOLIN HFA) 108 (90 Base) MCG/ACT inhaler, INHALE 1 TO 2 PUFFS INTO THE LUNGS EVERY 6 HOURS AS NEEDED FOR WHEEZING OR SHORTNESS OF BREATH, Disp: 20.1 g, Rfl: 1   aspirin EC 81 MG tablet, Take 1 tablet (81 mg total) by mouth daily. Swallow whole., Disp: 150 tablet, Rfl: 2   glucose blood test strip, Check sugars daily., Disp: 100 each, Rfl: 3   levothyroxine (SYNTHROID) 75 MCG tablet, Take 1 tablet (75 mcg total) by mouth daily before breakfast., Disp: 90 tablet, Rfl: 3   loratadine (CLARITIN) 10 MG tablet, Take 1 tablet (10 mg total) by mouth daily., Disp: 90 tablet, Rfl: 3   metFORMIN (GLUCOPHAGE-XR) 500 MG 24 hr tablet, Take 1 tablet (500 mg total) by  mouth 2 (two) times daily with a meal., Disp: 180 tablet, Rfl: 3   metoprolol succinate (TOPROL-XL) 100 MG 24 hr tablet, TAKE 1 TABLET BY MOUTH EVERY DAY WITH OR IMMEDIATELY FOLLOWING A MEAL, Disp: 90 tablet, Rfl: 1   mometasone (NASONEX) 50 MCG/ACT nasal spray, Place 2 sprays into the nose daily., Disp: 17 g, Rfl: 3   Olmesartan-amLODIPine-HCTZ 40-10-25 MG TABS, Take 1 tablet by mouth daily., Disp: 90 tablet, Rfl: 3   polyethylene glycol (MIRALAX) 17 g packet, Take 17 g by mouth daily as needed for moderate constipation., Disp: 14 each, Rfl: 0   Allergies: Allergies  Allergen Reactions   Oysters [Shellfish Allergy] Rash       Objective:   Vitals: Vitals:   04/06/23 1000  BP: (!) 139/51  Pulse: 65  Temp: 98 F (36.7 C)  SpO2: 100%     Physical Exam: Physical Exam Constitutional:      Appearance: Normal appearance.  Pulmonary:     Effort: Pulmonary effort is normal. No respiratory distress.  Neurological:     Mental Status: She is alert.  Psychiatric:        Mood and Affect: Mood normal.        Behavior: Behavior normal.      Data: Labs, imaging, and micro were reviewed in Epic. Refer to Assessment and Plan below for full details in Problem-Based Charting.  Assessment & Plan:  Hypothyroidism - TSH was low on  daily, so we decreased to daily at last visit - repeat TSH today, and adjust dose as needed  Essential hypertension - Blood pressure is at goal today. Her DBP's tend to run lower, so I'm just keeping SBP <140 - Continue Olmesartan-Amlodipine-HCTZ 40-10-25 daily and Metoprolol XL 100mg  daily   Asthma - chronic and stable - Albuterol inhaler prn  Type 2 diabetes mellitus (HCC) - chronic and stable - Decrease Metformin to 500mg  BID (she was on 1g BID). A1C at next visit  HLD (hyperlipidemia) - She is having muscle cramps - Decrease Rosuvastatin from 20mg  to 10mg  daily - Lipid panel today and in 5-6 months at next visit. Goal LDL <70 for  her  Healthcare maintenance - I encouraged her to get her flu shot & Shingrix vaccines  Fibromuscular dysplasia of both carotid arteries (HCC) - She follows with Neurology for this.  - Continue aspirin 81mg  daily, crestor (now 10mg  daily), A1C goal <7, and BP mgmt  Left-sided fourth cranial nerve palsy - Thought to be ischemic in nature - Saw Neurology for this, who recommended controlling ASCVD risk factors per PCP     Patient will follow up in 5 months  Mercie Eon, MD

## 2023-04-06 ENCOUNTER — Encounter: Payer: Self-pay | Admitting: Internal Medicine

## 2023-04-06 ENCOUNTER — Ambulatory Visit (INDEPENDENT_AMBULATORY_CARE_PROVIDER_SITE_OTHER): Payer: 59 | Admitting: Internal Medicine

## 2023-04-06 VITALS — BP 139/51 | HR 65 | Temp 98.0°F | Ht 61.0 in | Wt 177.7 lb

## 2023-04-06 DIAGNOSIS — I10 Essential (primary) hypertension: Secondary | ICD-10-CM | POA: Diagnosis not present

## 2023-04-06 DIAGNOSIS — J45909 Unspecified asthma, uncomplicated: Secondary | ICD-10-CM

## 2023-04-06 DIAGNOSIS — H4912 Fourth [trochlear] nerve palsy, left eye: Secondary | ICD-10-CM

## 2023-04-06 DIAGNOSIS — Z7984 Long term (current) use of oral hypoglycemic drugs: Secondary | ICD-10-CM

## 2023-04-06 DIAGNOSIS — E785 Hyperlipidemia, unspecified: Secondary | ICD-10-CM

## 2023-04-06 DIAGNOSIS — I773 Arterial fibromuscular dysplasia: Secondary | ICD-10-CM

## 2023-04-06 DIAGNOSIS — K5790 Diverticulosis of intestine, part unspecified, without perforation or abscess without bleeding: Secondary | ICD-10-CM

## 2023-04-06 DIAGNOSIS — Z0000 Encounter for general adult medical examination without abnormal findings: Secondary | ICD-10-CM

## 2023-04-06 DIAGNOSIS — E559 Vitamin D deficiency, unspecified: Secondary | ICD-10-CM

## 2023-04-06 DIAGNOSIS — E1169 Type 2 diabetes mellitus with other specified complication: Secondary | ICD-10-CM | POA: Diagnosis not present

## 2023-04-06 DIAGNOSIS — E039 Hypothyroidism, unspecified: Secondary | ICD-10-CM | POA: Diagnosis not present

## 2023-04-06 MED ORDER — ROSUVASTATIN CALCIUM 20 MG PO TABS
20.0000 mg | ORAL_TABLET | Freq: Every day | ORAL | 3 refills | Status: DC
Start: 2023-04-06 — End: 2023-04-06

## 2023-04-06 MED ORDER — ALBUTEROL SULFATE HFA 108 (90 BASE) MCG/ACT IN AERS
INHALATION_SPRAY | RESPIRATORY_TRACT | 1 refills | Status: AC
Start: 2023-04-06 — End: ?

## 2023-04-06 MED ORDER — OLMESARTAN-AMLODIPINE-HCTZ 40-10-25 MG PO TABS
1.0000 | ORAL_TABLET | Freq: Every day | ORAL | 3 refills | Status: DC
Start: 2023-04-06 — End: 2023-11-13

## 2023-04-06 MED ORDER — ROSUVASTATIN CALCIUM 10 MG PO TABS: 10.0000 mg | ORAL_TABLET | Freq: Every day | ORAL | 3 refills | Status: DC

## 2023-04-06 MED ORDER — METFORMIN HCL ER 500 MG PO TB24
500.0000 mg | ORAL_TABLET | Freq: Two times a day (BID) | ORAL | 3 refills | Status: DC
Start: 2023-04-06 — End: 2024-02-08

## 2023-04-06 NOTE — Assessment & Plan Note (Signed)
-   She is having muscle cramps - Decrease Rosuvastatin from 20mg  to 10mg  daily - Lipid panel today and in 5-6 months at next visit. Goal LDL <70 for her

## 2023-04-06 NOTE — Assessment & Plan Note (Signed)
-   She follows with Neurology for this.  - Continue aspirin 81mg  daily, crestor (now 10mg  daily), A1C goal <7, and BP mgmt

## 2023-04-06 NOTE — Assessment & Plan Note (Signed)
-   chronic and stable - Decrease Metformin to 500mg  BID (she was on 1g BID). A1C at next visit

## 2023-04-06 NOTE — Assessment & Plan Note (Signed)
-   Thought to be ischemic in nature - Saw Neurology for this, who recommended controlling ASCVD risk factors per PCP

## 2023-04-06 NOTE — Patient Instructions (Signed)
Dear Madison Jefferson,  It was a pleasure seeing you today.  I will call you with your lab results.  For your cholesterol, since you've having cramping in your muscles, I have decreased your Rosuvastatin medicine from 20mg  daily to 10mg  daily. We will recheck your cholesterol levels at our next visit to make sure this is a good dose for you.  For your diabetes, your numbers look great! You can decrease your Metformin to 1 pill in the morning and 1 pill in the evening.   Keep taking your thyroid medicine, and I'll call you if you need to change the dose.  Please get your flu shot at a pharmacy this fall.You can also call our clinic to get the flu shot here if you'd prefer.  I'll see you back this winter.  Sincerely, Dr. Mercie Eon

## 2023-04-06 NOTE — Assessment & Plan Note (Signed)
-   Blood pressure is at goal today. Her DBP's tend to run lower, so I'm just keeping SBP <140 - Continue Olmesartan-Amlodipine-HCTZ 40-10-25 daily and Metoprolol XL 100mg  daily

## 2023-04-06 NOTE — Assessment & Plan Note (Signed)
-   chronic and stable - Albuterol inhaler prn

## 2023-04-06 NOTE — Assessment & Plan Note (Signed)
-   I encouraged her to get her flu shot & Shingrix vaccines

## 2023-04-06 NOTE — Assessment & Plan Note (Signed)
-   TSH was low on daily, so we decreased to daily at last visit - repeat TSH today, and adjust dose as needed

## 2023-04-07 LAB — LIPID PANEL
Chol/HDL Ratio: 2.2 ratio (ref 0.0–4.4)
Cholesterol, Total: 96 mg/dL — ABNORMAL LOW (ref 100–199)
HDL: 44 mg/dL (ref >39)
LDL Chol Calc (NIH): 28 mg/dL (ref 0–99)
Triglycerides: 141 mg/dL (ref 0–149)
VLDL Cholesterol Cal: 24 mg/dL (ref 5–40)

## 2023-04-07 LAB — VITAMIN D 25 HYDROXY (VIT D DEFICIENCY, FRACTURES): Vit D, 25-Hydroxy: 43.4 ng/mL (ref 30.0–100.0)

## 2023-04-07 LAB — TSH: TSH: 0.427 u[IU]/mL — ABNORMAL LOW (ref 0.450–4.500)

## 2023-05-18 ENCOUNTER — Other Ambulatory Visit: Payer: Self-pay | Admitting: Internal Medicine

## 2023-06-10 ENCOUNTER — Other Ambulatory Visit: Payer: Self-pay | Admitting: Internal Medicine

## 2023-06-10 DIAGNOSIS — I10 Essential (primary) hypertension: Secondary | ICD-10-CM

## 2023-08-22 NOTE — Telephone Encounter (Signed)
 error

## 2023-09-08 ENCOUNTER — Other Ambulatory Visit: Payer: Self-pay

## 2023-09-08 DIAGNOSIS — J301 Allergic rhinitis due to pollen: Secondary | ICD-10-CM

## 2023-09-08 MED ORDER — LORATADINE 10 MG PO TABS
10.0000 mg | ORAL_TABLET | Freq: Every day | ORAL | 3 refills | Status: AC
Start: 2023-09-08 — End: 2024-09-07

## 2023-09-08 NOTE — Telephone Encounter (Signed)
Medication sent to pharmacy  

## 2023-09-28 ENCOUNTER — Telehealth: Payer: Self-pay | Admitting: Internal Medicine

## 2023-09-28 NOTE — Telephone Encounter (Signed)
  loratadine (CLARITIN) 10 MG tablet  [id number] - [place], [place] - [address] AT [facility] OF [address] & [address]

## 2023-09-28 NOTE — Telephone Encounter (Signed)
Rx last refilled on 09/08/23 for 90 tablets with 3 refills. Refill not appropriate.

## 2023-10-19 DIAGNOSIS — H43811 Vitreous degeneration, right eye: Secondary | ICD-10-CM | POA: Diagnosis not present

## 2023-10-19 DIAGNOSIS — H532 Diplopia: Secondary | ICD-10-CM | POA: Diagnosis not present

## 2023-11-11 ENCOUNTER — Observation Stay (HOSPITAL_COMMUNITY)
Admission: EM | Admit: 2023-11-11 | Discharge: 2023-11-13 | Disposition: A | Discharge status: Home / Self Care | Attending: Internal Medicine | Admitting: Internal Medicine

## 2023-11-11 ENCOUNTER — Emergency Department (HOSPITAL_COMMUNITY)

## 2023-11-11 ENCOUNTER — Other Ambulatory Visit: Payer: Self-pay

## 2023-11-11 ENCOUNTER — Encounter (HOSPITAL_COMMUNITY): Payer: Self-pay

## 2023-11-11 DIAGNOSIS — E1122 Type 2 diabetes mellitus with diabetic chronic kidney disease: Secondary | ICD-10-CM | POA: Insufficient documentation

## 2023-11-11 DIAGNOSIS — R072 Precordial pain: Secondary | ICD-10-CM

## 2023-11-11 DIAGNOSIS — E785 Hyperlipidemia, unspecified: Secondary | ICD-10-CM | POA: Diagnosis present

## 2023-11-11 DIAGNOSIS — I160 Hypertensive urgency: Secondary | ICD-10-CM | POA: Diagnosis not present

## 2023-11-11 DIAGNOSIS — Z7984 Long term (current) use of oral hypoglycemic drugs: Secondary | ICD-10-CM | POA: Insufficient documentation

## 2023-11-11 DIAGNOSIS — I129 Hypertensive chronic kidney disease with stage 1 through stage 4 chronic kidney disease, or unspecified chronic kidney disease: Secondary | ICD-10-CM | POA: Insufficient documentation

## 2023-11-11 DIAGNOSIS — J45909 Unspecified asthma, uncomplicated: Secondary | ICD-10-CM | POA: Diagnosis not present

## 2023-11-11 DIAGNOSIS — R0789 Other chest pain: Secondary | ICD-10-CM | POA: Diagnosis not present

## 2023-11-11 DIAGNOSIS — I10 Essential (primary) hypertension: Secondary | ICD-10-CM | POA: Diagnosis present

## 2023-11-11 DIAGNOSIS — Z8673 Personal history of transient ischemic attack (TIA), and cerebral infarction without residual deficits: Secondary | ICD-10-CM | POA: Insufficient documentation

## 2023-11-11 DIAGNOSIS — R079 Chest pain, unspecified: Secondary | ICD-10-CM | POA: Diagnosis not present

## 2023-11-11 DIAGNOSIS — E119 Type 2 diabetes mellitus without complications: Secondary | ICD-10-CM

## 2023-11-11 DIAGNOSIS — Z79899 Other long term (current) drug therapy: Secondary | ICD-10-CM | POA: Insufficient documentation

## 2023-11-11 DIAGNOSIS — H4912 Fourth [trochlear] nerve palsy, left eye: Secondary | ICD-10-CM | POA: Diagnosis not present

## 2023-11-11 DIAGNOSIS — Z7982 Long term (current) use of aspirin: Secondary | ICD-10-CM | POA: Insufficient documentation

## 2023-11-11 DIAGNOSIS — N183 Chronic kidney disease, stage 3 unspecified: Secondary | ICD-10-CM | POA: Diagnosis not present

## 2023-11-11 DIAGNOSIS — E039 Hypothyroidism, unspecified: Secondary | ICD-10-CM | POA: Diagnosis present

## 2023-11-11 DIAGNOSIS — I773 Arterial fibromuscular dysplasia: Secondary | ICD-10-CM | POA: Diagnosis not present

## 2023-11-11 LAB — BASIC METABOLIC PANEL WITH GFR
Anion gap: 7 (ref 5–15)
BUN: 10 mg/dL (ref 8–23)
CO2: 26 mmol/L (ref 22–32)
Calcium: 8.6 mg/dL — ABNORMAL LOW (ref 8.9–10.3)
Chloride: 106 mmol/L (ref 98–111)
Creatinine, Ser: 1.03 mg/dL — ABNORMAL HIGH (ref 0.44–1.00)
GFR, Estimated: 57 mL/min — ABNORMAL LOW (ref >60)
Glucose, Bld: 193 mg/dL — ABNORMAL HIGH (ref 70–99)
Potassium: 3.8 mmol/L (ref 3.5–5.1)
Sodium: 139 mmol/L (ref 135–145)

## 2023-11-11 LAB — URINALYSIS, ROUTINE W REFLEX MICROSCOPIC
Bacteria, UA: NONE SEEN
Bilirubin Urine: NEGATIVE
Glucose, UA: 50 mg/dL — AB
Hgb urine dipstick: NEGATIVE
Ketones, ur: NEGATIVE mg/dL
Leukocytes,Ua: NEGATIVE
Nitrite: NEGATIVE
Protein, ur: 30 mg/dL — AB
Specific Gravity, Urine: 1.006 (ref 1.005–1.030)
pH: 7 (ref 5.0–8.0)

## 2023-11-11 LAB — GLUCOSE, CAPILLARY: Glucose-Capillary: 238 mg/dL — ABNORMAL HIGH (ref 70–99)

## 2023-11-11 LAB — CBC
HCT: 36.7 % (ref 36.0–46.0)
Hemoglobin: 12.6 g/dL (ref 12.0–15.0)
MCH: 28.4 pg (ref 26.0–34.0)
MCHC: 34.3 g/dL (ref 30.0–36.0)
MCV: 82.7 fL (ref 80.0–100.0)
Platelets: 168 10*3/uL (ref 150–400)
RBC: 4.44 MIL/uL (ref 3.87–5.11)
RDW: 13.4 % (ref 11.5–15.5)
WBC: 6.7 10*3/uL (ref 4.0–10.5)
nRBC: 0 % (ref 0.0–0.2)

## 2023-11-11 LAB — TROPONIN I (HIGH SENSITIVITY)
Troponin I (High Sensitivity): 3 ng/L (ref <18)
Troponin I (High Sensitivity): 5 ng/L (ref <18)

## 2023-11-11 MED ORDER — OLMESARTAN-AMLODIPINE-HCTZ 40-10-25 MG PO TABS: 1.0000 | ORAL_TABLET | Freq: Every day | ORAL | Status: DC

## 2023-11-11 MED ORDER — ROSUVASTATIN CALCIUM 5 MG PO TABS
10.0000 mg | ORAL_TABLET | Freq: Every day | ORAL | Status: DC
  Administered 2023-11-12 – 2023-11-13 (×2): 10 mg via ORAL
  Filled 2023-11-11 (×3): qty 2

## 2023-11-11 MED ORDER — AMLODIPINE BESYLATE 5 MG PO TABS
10.0000 mg | ORAL_TABLET | Freq: Every day | ORAL | Status: DC
  Administered 2023-11-12 – 2023-11-13 (×2): 10 mg via ORAL
  Filled 2023-11-11 (×2): qty 2

## 2023-11-11 MED ORDER — POLYETHYLENE GLYCOL 3350 17 G PO PACK: 17.0000 g | PACK | Freq: Every day | ORAL | Status: DC | PRN

## 2023-11-11 MED ORDER — ASPIRIN 81 MG PO TBEC
81.0000 mg | DELAYED_RELEASE_TABLET | Freq: Every day | ORAL | Status: DC
  Administered 2023-11-12: 81 mg via ORAL
  Filled 2023-11-11: qty 1

## 2023-11-11 MED ORDER — HYDROCHLOROTHIAZIDE 25 MG PO TABS
25.0000 mg | ORAL_TABLET | Freq: Every day | ORAL | Status: DC
  Administered 2023-11-12 – 2023-11-13 (×2): 25 mg via ORAL
  Filled 2023-11-11 (×2): qty 1

## 2023-11-11 MED ORDER — SPIRONOLACTONE 25 MG PO TABS
25.0000 mg | ORAL_TABLET | Freq: Every day | ORAL | Status: DC
  Administered 2023-11-11 – 2023-11-12 (×2): 25 mg via ORAL
  Filled 2023-11-11 (×2): qty 1

## 2023-11-11 MED ORDER — ACETAMINOPHEN 650 MG RE SUPP: 650.0000 mg | Freq: Four times a day (QID) | RECTAL | Status: DC | PRN

## 2023-11-11 MED ORDER — HYDRALAZINE HCL 20 MG/ML IJ SOLN
10.0000 mg | INTRAMUSCULAR | Status: DC | PRN
  Administered 2023-11-11 – 2023-11-13 (×4): 10 mg via INTRAVENOUS
  Filled 2023-11-11 (×4): qty 1

## 2023-11-11 MED ORDER — INSULIN ASPART 100 UNIT/ML IJ SOLN
0.0000 [IU] | Freq: Three times a day (TID) | INTRAMUSCULAR | Status: DC
  Administered 2023-11-12: 2 [IU] via SUBCUTANEOUS
  Administered 2023-11-12: 3 [IU] via SUBCUTANEOUS
  Administered 2023-11-12: 2 [IU] via SUBCUTANEOUS
  Administered 2023-11-13: 3 [IU] via SUBCUTANEOUS
  Administered 2023-11-13: 8 [IU] via SUBCUTANEOUS

## 2023-11-11 MED ORDER — LEVOTHYROXINE SODIUM 75 MCG PO TABS
75.0000 ug | ORAL_TABLET | Freq: Every day | ORAL | Status: DC
  Administered 2023-11-12 – 2023-11-13 (×2): 75 ug via ORAL
  Filled 2023-11-11 (×2): qty 1

## 2023-11-11 MED ORDER — METOPROLOL SUCCINATE ER 100 MG PO TB24
100.0000 mg | ORAL_TABLET | Freq: Every day | ORAL | Status: DC
  Administered 2023-11-12 – 2023-11-13 (×2): 100 mg via ORAL
  Filled 2023-11-11 (×2): qty 1

## 2023-11-11 MED ORDER — HYDRALAZINE HCL 20 MG/ML IJ SOLN
10.0000 mg | Freq: Once | INTRAMUSCULAR | Status: AC
  Administered 2023-11-11: 10 mg via INTRAVENOUS
  Filled 2023-11-11: qty 1

## 2023-11-11 MED ORDER — IRBESARTAN 300 MG PO TABS
300.0000 mg | ORAL_TABLET | Freq: Every day | ORAL | Status: DC
  Administered 2023-11-12 – 2023-11-13 (×2): 300 mg via ORAL
  Filled 2023-11-11 (×2): qty 1

## 2023-11-11 MED ORDER — ACETAMINOPHEN 325 MG PO TABS
650.0000 mg | ORAL_TABLET | Freq: Four times a day (QID) | ORAL | Status: DC | PRN
  Administered 2023-11-11: 650 mg via ORAL
  Filled 2023-11-11: qty 2

## 2023-11-11 MED ORDER — RIVAROXABAN 10 MG PO TABS
10.0000 mg | ORAL_TABLET | Freq: Every day | ORAL | Status: DC
Start: 2023-11-11 — End: 2023-11-12
  Administered 2023-11-11 – 2023-11-12 (×2): 10 mg via ORAL
  Filled 2023-11-11 (×2): qty 1

## 2023-11-11 NOTE — ED Triage Notes (Signed)
 Pt c/o HTN since yesterday. Pt states her home cuff was reading error it was so high. Pt has had intermittent chest pain, shortness of breath, and headache. Pt denies nausea or vomiting.

## 2023-11-11 NOTE — Hospital Course (Addendum)
 Madison Jefferson is a 73 y.o. 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    Acute Severe Symptomatic hypertension  Presented with BP 251/91 (138), symptoms of chest tightness and headaches. She took home blood pressure medications Toprol XL 100 mg and Olmesartan-amLODIPine-HCTZ 40-10-25 this morning, has been taking it daily as well. No blood pressure medication changes her last PCP note. Did change synthroid dose, will check TSH. She does have underlying history of FMD that can affect renal arteries and BP. She also notes increasing salt in her diet this week. Etiology presently refractory medications vs diet vs underlying FMD vs aldosteronism vs medications. Per exam, appears stable wo tachycardia, on RA, afebrile. She is given IV hydralazine 10 mg in the ED with minimal change in her BP.  -Follow up on TSH -Follow up on US Renal Artery Duplex  -IV hydralazine 10 mg q4h for SBP > 160 and DBP > 110  -Continue home medication HCTZ 25 mg -amlodipine 10 mg -irbesartan 300 mg daily starting tomorrow -Continue home medication Toprol 100 mg daily starting tomorrow -Start Spironolactone 25 mg daily starting today   Hematuria Reports reddish urine, no dysuria. Does report increase in urine frequency.  - Follow up on UA    Type 2 diabetes Hgb A1c 6.1 12/2022, on home medication metformin.  -Will hold home medication metformin -SSI moderate  -Check CBGs    HLD -Continue home medication Crestor 10 mg daily   History of fibromuscular dysplasia of both carotid arteries History of left 4th cranial nerve palsy History of ischemic 6th nerve palsy Patient follows neurology, last seen on 10/2022, had  a CT angio head and neck on 02/2022 negative for LVO, showed mild atherosclerotic calcification of the bilateral carotid bifurcation and mild- moderate stenosis of the bilateral cavernous carotid.  She also had evidence of FMD on bilateral internal carotid artery without stenosis or dissection. Management with aspirin, statin, blood pressure control.   =========================================================================  [name],   You came to the hospital for high blood pressures and headaches. We treated you with medications to help bring down your BP.    For your Blood pressure - Continue taking     If you have any of these following symptoms, please call us or seek care at an emergency department: -Chest Pain -Difficulty Breathing -Worsening abdominal pain -Syncope (passing out) -Drooping of face -Slurred speech -Sudden weakness in your leg or arm -Fever -Chills -blood in the stool -dark black, sticky stool  If you have any questions or concerns, call our clinic at [phone] or after hours call [phone] and ask for the internal medicine resident on call.  I am glad you are feeling better. It was a pleasure taking care for you. I wish a good recovery and good health!   Dr. Jeral Pinch   ===================================================

## 2023-11-11 NOTE — ED Notes (Signed)
This RN unsuccessful at IV

## 2023-11-11 NOTE — ED Provider Notes (Signed)
 Patient's care assumed at 3:00 PM.  Patient presents to the emergency department with chest pain and significant hypertension.  Patient has received medications for hypertension but remains hypertensive.  Patient's first troponin is negative.  Patient is followed by [facility] internal medicine I will consult internal medicine for admission for hypertensive urgency as well as chest pain evaluation continuation.   Elson Areas, [place] 11/11/23 1726    Maia Plan, MD 11/14/23 1515

## 2023-11-11 NOTE — ED Provider Notes (Signed)
 [place] EMERGENCY DEPARTMENT AT [facility] Provider Note   CSN: [id number] Arrival date & time: 11/11/23  1238     History  Chief Complaint  Patient presents with   Hypertension    Madison Jefferson is a 73 y.o. female.  Patient with history of hypertension currently taking metoprolol and combination olmesartan-amlodipine-hydrochlorothiazide, history of atrial fibrillation not on anticoagulation, history of TIA--presents to the emergency department for evaluation of elevated blood pressure.  Patient reports having some mild vague chest pains in the left chest without radiation yesterday and today.  She first noticed this after taking out the trash.  Pain has been intermittent since then.  No vomiting or diaphoresis.  She felt bad and checked her blood pressure and noted that it was very high, upper 100s systolic to at times her blood pressure cuff reading "error".  She has had a mild headache as well for which she took ibuprofen yesterday.  Her symptoms were concerning, prompting emergency department visit today.  Patient denies signs of stroke including: facial droop, slurred speech, aphasia, weakness/numbness in extremities, imbalance/trouble walking.  Cath 11/11/2003:  IMPRESSION:  1. Minimal coronary artery disease.  2. Normal left ventricular systolic function.  3. Noncardiac chest pain.  Cath 01/28/2012: Impression of Heart Catheterization:  1. Normal left main coronary artery.  2. Normal left anterior descending artery and its branches.  3. Normal left circumflex artery and its branches.  4. Normal right coronary artery.  5. Normal left ventricular systolic function.  LVEDP 17 mmHg.  Ejection fraction 70 %.    Home Medications Prior to Admission medications   Medication Sig Start Date End Date Taking? Authorizing Provider  albuterol (VENTOLIN HFA) 108 (90 Base) MCG/ACT inhaler INHALE 1 TO 2 PUFFS INTO THE LUNGS EVERY 6 HOURS AS NEEDED FOR WHEEZING OR SHORTNESS OF  BREATH 04/06/23   Mercie Eon, MD  ASPIRIN LOW DOSE 81 MG tablet TAKE 1 TABLET BY MOUTH EVERY DAY. SWALLOW WHOLE 05/19/23   Mercie Eon, MD  glucose blood test strip Check sugars daily. 12/29/22   Mercie Eon, MD  levothyroxine (SYNTHROID) 75 MCG tablet Take 1 tablet (75 mcg total) by mouth daily before breakfast. 02/18/23 02/18/24  Mercie Eon, MD  loratadine (CLARITIN) 10 MG tablet Take 1 tablet (10 mg total) by mouth daily. 09/08/23 09/07/24  Mercie Eon, MD  metFORMIN (GLUCOPHAGE-XR) 500 MG 24 hr tablet Take 1 tablet (500 mg total) by mouth 2 (two) times daily with a meal. 04/06/23 04/05/24  Mercie Eon, MD  metoprolol succinate (TOPROL-XL) 100 MG 24 hr tablet TAKE 1 TABLET BY MOUTH EVERY DAY WITH OR IMMEDIATELY FOLLOWING A MEAL 06/10/23   Mercie Eon, MD  mometasone (NASONEX) 50 MCG/ACT nasal spray Place 2 sprays into the nose daily. 12/29/22 12/29/23  Mercie Eon, MD  Olmesartan-amLODIPine-HCTZ 40-10-25 MG TABS Take 1 tablet by mouth daily. 04/06/23   Mercie Eon, MD  polyethylene glycol (MIRALAX) 17 g packet Take 17 g by mouth daily as needed for moderate constipation. 01/30/22   Park Pope, MD  rosuvastatin (CRESTOR) 10 MG tablet Take 1 tablet (10 mg total) by mouth daily. 04/06/23 04/05/24  Mercie Eon, MD      Allergies    Laqueta Jean allergy]    Review of Systems   Review of Systems  Physical Exam Updated Vital Signs BP (!) 251/91 (BP Location: Left Arm)   Pulse 71   Temp 97.8 F (36.6 C) (Oral)   Resp 16   Ht 5' (1.524 m)  Wt 77.1 kg   SpO2 99%   BMI 33.20 kg/m   Physical Exam Vitals and nursing note reviewed.  Constitutional:      Appearance: She is well-developed. She is not diaphoretic.  HENT:     Head: Normocephalic and atraumatic.     Mouth/Throat:     Mouth: Mucous membranes are not dry.  Eyes:     Conjunctiva/sclera: Conjunctivae normal.  Neck:     Vascular: Normal carotid pulses. No JVD.     Trachea: Trachea normal. No tracheal deviation.   Cardiovascular:     Rate and Rhythm: Normal rate and regular rhythm.     Pulses: No decreased pulses.          Radial pulses are 2+ on the right side and 2+ on the left side.     Heart sounds: Normal heart sounds, S1 normal and S2 normal. No murmur heard. Pulmonary:     Effort: Pulmonary effort is normal. No respiratory distress.     Breath sounds: No wheezing.  Chest:     Chest wall: No tenderness.  Abdominal:     General: Bowel sounds are normal.     Palpations: Abdomen is soft.     Tenderness: There is no abdominal tenderness. There is no guarding or rebound.  Musculoskeletal:        General: Normal range of motion.     Cervical back: Normal range of motion and neck supple. No muscular tenderness.  Skin:    General: Skin is warm and dry.     Coloration: Skin is not pale.  Neurological:     Mental Status: She is alert.     ED Results / Procedures / Treatments   Labs (all labs ordered are listed, but only abnormal results are displayed) Labs Reviewed  CBC  BASIC METABOLIC PANEL WITH GFR  TROPONIN I (HIGH SENSITIVITY)  TROPONIN I (HIGH SENSITIVITY)    ED ECG REPORT   Date: 11/11/2023  Rate: 70  Rhythm: normal sinus rhythm  QRS Axis: normal  Intervals: normal  ST/T Wave abnormalities: normal  Conduction Disutrbances:none  Narrative Interpretation:   Old EKG Reviewed: changes noted, slower today  I have personally reviewed the EKG tracing and agree with the computerized printout as noted.   Radiology DG Chest 2 View Result Date: 11/11/2023 CLINICAL DATA:  Chest pain. EXAM: CHEST - 2 VIEW COMPARISON:  01/25/2022. FINDINGS: The heart size and mediastinal contours are within normal limits. No focal consolidation, pleural effusion, or pneumothorax. Degenerative changes of the mid to lower thoracic spine. No acute osseous abnormality. IMPRESSION: No acute cardiopulmonary findings. Electronically Signed   By: Hart Robinsons M.D.   On: 11/11/2023 14:10     Procedures Procedures    Medications Ordered in ED Medications  hydrALAZINE (APRESOLINE) injection 10 mg (10 mg Intravenous Given 11/11/23 1527)    ED Course/ Medical Decision Making/ A&P    Patient seen and examined. History obtained directly from patient.   Labs/EKG: Ordered CBC, BMP, troponin.   Imaging: Personally reviewed and interpreted chest x-ray, agree no edema or infiltrate  Medications/Fluids: IV hydralazine  Most recent vital signs reviewed and are as follows: BP (!) 235/91   Pulse 60   Temp 97.8 F (36.6 C) (Oral)   Resp 15   Ht 5' (1.524 m)   Wt 77.1 kg   SpO2 98%   BMI 33.20 kg/m   Initial impression: Hypertensive urgency with chest pain  3:36 PM Signout to [place] PA-C at shift change.  Pending labs, BP reassessment.                                 Medical Decision Making Amount and/or Complexity of Data Reviewed Labs: ordered. Radiology: ordered.  Risk Prescription drug management.   Pending eval for CP and marked elevated BP.         Final Clinical Impression(s) / ED Diagnoses Final diagnoses:  Hypertensive urgency  Precordial pain    Rx / DC Orders ED Discharge Orders     None         Renne Crigler, Cordelia Poche 11/11/23 1538    Lonell Grandchild, MD 11/12/23 1819

## 2023-11-11 NOTE — H&P (Addendum)
 Date: 11/11/2023               Patient Name:  Madison Jefferson MRN: [id number]  DOB: 1951/03/26 Age / Sex: 73 y.o., female   PCP: Mercie Eon, MD         Medical Service: Internal Medicine Teaching Service         Attending Physician: Dr. Reymundo Poll, MD      First Contact: Dr. Jeral Pinch, DO Pager [phone]    Second Contact: Dr. 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  She states yesterday she went to take out the trash and felt dizzy and lightheaded, and decided to check her BP. She states that she went to check her pressures and she states that her systolics were in the 190s. Some were so high that the machine said error. She states that she went to the eye doctor about 1 week ago and she notes that she has had some floaters and she was given some eye drops. She notes that she had a tightness episode in her chest. She states that she get these episodes of chest pain happen randomly and sometimes wake her up out of her sleep. Denies burning sensation, but does note acidic taste in the back of her throat. She is unsure about duration of chest tightness. She reports having some swelling of  her fingers. She denies any numbness or tingling in her arms or legs. She does endorse having some bloating episode. She states that she has been straining recently. She states that she feels the metformin has recently made her more constipated. She states that her  urine looks redder. Denies any bright red blood in her stools. Denies any dysuria, but does endorse some frequency. She does report that she has been taking her medications. Patient had PCP appointment in August and had changes on her meds for her BP and change in her meds for her thyroid. She notes eating more salt in her diet this week.   ED Course: Patient initially presented with blood pressure of 251/91.  Patient was satting well on room air.  Afebrile.  Labs showed flat troponin. BMP showing creatinine at baseline. CBC within normal limits. Chest x-ray showing no acute cardiopulmonary findings. Patient was given hydralazine and concerns for hypertensive urgency and IMTS consulted for further evaluation and management.  Past Medical History: Past Medical History:  Diagnosis Date   Allergy    Arthritis    Asthma    Atrial fibrillation (HCC)    h/o of isolated AF associated with hypothyroidism    Degenerative arthritis of thumb, left 06/01/2016   Diverticulosis    cecum   Diverticulosis    colonoscopy 2002 [facility]   Dysphagia 06/23/2021   Fatty liver    Fatty liver 10/29/2009   Qualifier: Diagnosis of   By: Huntley Dec, Scott       GERD 10/21/2009   Qualifier: Diagnosis of   By: Huntley Dec, Lorin Picket  GERD (gastroesophageal reflux disease)    Hypertension    Lumbar radiculopathy    Osteoarthritis of left glenohumeral joint 06/13/2016   Thrombocytopenia (HCC) 02/02/2022   Thyroid disease    hypothyroid   TIA (transient ischemic attack)    2006   Urinary incontinence    mixed urge and stress    Vitamin D deficiency    Vitamin D deficiency 12/24/2009   Qualifier: Diagnosis of   By: Huntley Dec, Scott        Meds:  Current Outpatient Medications  Medication Instructions   albuterol (VENTOLIN HFA) 108 (90 Base) MCG/ACT inhaler INHALE 1 TO 2 PUFFS INTO THE LUNGS EVERY 6 HOURS AS NEEDED FOR WHEEZING OR SHORTNESS OF BREATH   ASPIRIN LOW DOSE 81 MG tablet TAKE 1 TABLET BY  MOUTH EVERY DAY. SWALLOW WHOLE   Cholecalciferol (VITAMIN D-3 PO) 1 capsule, Oral, Daily   glucose blood test strip Check sugars daily.   ibuprofen (ADVIL) 200 mg, Oral, Daily PRN   levothyroxine (SYNTHROID) 75 mcg, Oral, Daily before breakfast   loratadine (CLARITIN) 10 mg, Oral, Daily   metFORMIN (GLUCOPHAGE-XR) 500 mg, Oral, 2 times daily with meals   metoprolol succinate (TOPROL-XL) 100 MG 24 hr tablet TAKE 1 TABLET BY MOUTH EVERY DAY WITH OR IMMEDIATELY FOLLOWING A MEAL   Olmesartan-amLODIPine-HCTZ 40-10-25 MG TABS 1 tablet, Oral, Daily   polyethylene glycol (MIRALAX) 17 g, Oral, Daily PRN   rosuvastatin (CRESTOR) 10 mg, Oral, Daily    Past Surgical History:  Procedure Laterality Date   ABLATION     thyroid with radioactive iodine ablation   CARPAL TUNNEL RELEASE     left wrist   CHOLECYSTECTOMY     COLONOSCOPY WITH PROPOFOL N/A 01/28/2022   Procedure: COLONOSCOPY WITH PROPOFOL;  Surgeon: [name], MD;  Location: [facility] ENDOSCOPY;  Service: Gastroenterology;  Laterality: N/A;   ESOPHAGOGASTRODUODENOSCOPY (EGD) WITH PROPOFOL N/A 01/28/2022   Procedure: ESOPHAGOGASTRODUODENOSCOPY (EGD) WITH PROPOFOL;  Surgeon: [name], MD;  Location[facility] ENDOSCOPY;  Service: Gastroenterology;  Laterality: N/A;   ESOPHAGOGASTRODUODENOSCOPY (EGD) WITH PROPOFOL N/A 01/29/2022   Procedure: ESOPHAGOGASTRODUODENOSCOPY (EGD) WITH PROPOFOL;  Surgeon: [name], MD;  Location: [facility] ENDOSCOPY;  Service: Gastroenterology;  Laterality: N/A;   HEMOSTASIS CLIP PLACEMENT  01/28/2022   Procedure: HEMOSTASIS CLIP PLACEMENT;  Surgeon: [name], MD;  Location: [facility] ENDOSCOPY;  Service: Gastroenterology;;   HEMOSTASIS CLIP PLACEMENT  01/29/2022   Procedure: HEMOSTASIS CLIP PLACEMENT;  Surgeon: [name], MD;  Location: [facility] ENDOSCOPY;  Service: Gastroenterology;;   HEMOSTASIS CONTROL  01/28/2022   Procedure: HEMOSTASIS CONTROL - EPI;  Surgeon: [name], MD;  Location: [facility] ENDOSCOPY;  Service:  Gastroenterology;;   LEFT HEART CATHETERIZATION WITH CORONARY ANGIOGRAM N/A 01/28/2012   Procedure: LEFT HEART CATHETERIZATION WITH CORONARY ANGIOGRAM;  Surgeon: Ricki Rodriguez, MD;  Location: [facility] CATH LAB;  Service: Cardiovascular;  Laterality: N/A;   OTHER SURGICAL HISTORY     carpel tunnel surgery    OTHER SURGICAL HISTORY     carpel tunnel repair   POLYPECTOMY  01/28/2022   Procedure: POLYPECTOMY;  Surgeon: [name], MD;  Location: [facility] ENDOSCOPY;  Service: Gastroenterology;;   POLYPECTOMY  01/29/2022   Procedure: POLYPECTOMY;  Surgeon: [name], MD;  Location: [facility] ENDOSCOPY;  Service: Gastroenterology;;   RIGHT HEART CATH  2005-Dr. Algie Coffer   minimal CAD; left circumflex dominant with 10-15% proximal lesion;; normal LVF   TUBAL LIGATION     VAGINAL HYSTERECTOMY     partial hysterectomy 1983 vaginal bleeding    Social:  Living Situation: Lives by herself  Functionality: Independent on ADLs and IADLs Strong family support PCP: Mercie Eon, MD Tobacco: Never smoker Alcohol: None Drugs: None  Family History:  History of stroke and MI in parents  Allergies: Allergies as of 11/11/2023 - Review Complete 11/11/2023  Allergen Reaction Noted   Oysters [shellfish allergy] Rash 01/28/2012    Review of Systems: A complete ROS was negative except as per HPI.   OBJECTIVE:   Physical Exam: Blood pressure (!) 222/110, pulse 66, temperature 98.2 F (36.8 C), temperature source Oral, resp. rate 18, height 5' (1.524 m), weight 77.1 kg, SpO2 97%.   Constitutional: Appears well, laying in bed, no acute distress HENT: normocephalic atraumatic, mucous membranes moist Eyes: conjunctiva non-erythematous Cardiovascular: regular rate and rhythm, no m/r/g Pulmonary/Chest: normal work of breathing on room air, lungs clear to auscultation bilaterally Abdominal: soft, non-tender, non-distended bowel sounds present MSK: normal bulk and tone, no lower extremity edema bilaterally, +edematous  digits  Neurological: Awake, alert, answering questions appropriately Skin: warm and dry Psych: Pleasant  Labs: CBC    Component Value Date/Time   WBC 6.7 11/11/2023 1445   RBC 4.44 11/11/2023 1445   HGB 12.6 11/11/2023 1445   HGB 12.7 12/29/2022 1042   HCT 36.7 11/11/2023 1445   HCT 37.7 12/29/2022 1042   PLT 168 11/11/2023 1445   PLT 189 12/29/2022 1042   MCV 82.7 11/11/2023 1445   MCV 83 12/29/2022 1042   MCH 28.4 11/11/2023 1445   MCHC 34.3 11/11/2023 1445   RDW 13.4 11/11/2023 1445   RDW 12.9 12/29/2022 1042   LYMPHSABS 1.7 02/18/2022 1318   LYMPHSABS 1.4 02/02/2022 1129   MONOABS 0.5 02/18/2022 1318   EOSABS 0.1 02/18/2022 1318   EOSABS 0.1 02/02/2022 1129   BASOSABS 0.1 02/18/2022 1318   BASOSABS 0.0 02/02/2022 1129     CMP     Component Value Date/Time   NA 139 11/11/2023 1445   NA 142 12/29/2022 1042   K 3.8 11/11/2023 1445   CL 106 11/11/2023 1445   CO2 26 11/11/2023 1445   GLUCOSE 193 (H) 11/11/2023 1445   BUN 10 11/11/2023 1445   BUN 18 12/29/2022 1042   CREATININE 1.03 (H) 11/11/2023 1445   CREATININE 0.86 07/09/2014 0910   CALCIUM 8.6 (L) 11/11/2023 1445   PROT 7.0 04/27/2022 1105   ALBUMIN 4.5 04/27/2022 1105   AST 31 04/27/2022 1105   ALT 32 04/27/2022 1105   ALKPHOS 43 (L) 04/27/2022 1105   BILITOT 1.2 04/27/2022 1105   GFRNONAA 57 (L) 11/11/2023 1445   GFRNONAA 71 07/09/2014 0910   GFRAA 68 07/15/2020 1027   GFRAA 82 07/09/2014 0910    Imaging: DG Chest 2 View Result Date: 11/11/2023 CLINICAL DATA:  Chest pain. EXAM: CHEST - 2 VIEW COMPARISON:  01/25/2022. FINDINGS: The heart size and mediastinal contours are within normal limits. No focal consolidation, pleural effusion, or pneumothorax. Degenerative changes of the mid to lower thoracic spine. No acute osseous abnormality. IMPRESSION: No acute cardiopulmonary findings. Electronically Signed   By: Hart Robinsons M.D.   On: 11/11/2023 14:10    EKG: personally reviewed my interpretation  is s normal ventricular rate, sinus rhythm, normal PR interval, QTc 457.  ASSESSMENT & PLAN:   Assessment & Plan by Problem: Principal Problem:   Hypertensive urgency   ANDRENA MARGERUM is a 73 y.o. 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   Acute Severe Symptomatic hypertension  Presented with BP 251/91 (138), symptoms of chest tightness and headaches. She took home blood pressure medications Toprol XL 100 mg and Olmesartan-amLODIPine-HCTZ 40-10-25 this morning, has been taking it daily as well. No blood pressure medication changes her last PCP note. Did change synthroid dose, will check TSH. She does have underlying history of FMD that can affect renal arteries and BP. She also notes increasing salt in her diet this week. Etiology presently refractory medications vs diet vs underlying FMD vs aldosteronism vs medications. Per exam, appears stable wo tachycardia, on RA, afebrile. She is given IV hydralazine 10 mg in the ED with minimal change in her BP.  -Follow up on TSH -Follow up on US Renal Artery Duplex  -IV hydralazine 10 mg q4h for SBP > 160 and DBP > 110  -Continue home medication HCTZ 25 mg -amlodipine 10 mg -irbesartan 300 mg daily starting tomorrow -Continue home medication Toprol 100 mg daily starting tomorrow -Start Spironolactone 25 mg daily starting today  Hematuria Reports reddish urine, no dysuria. Does report increase in urine frequency.  - Follow up on UA   Type 2 diabetes Hgb A1c 6.1 12/2022, on home medication metformin.  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  She also had evidence of FMD on bilateral internal carotid artery without stenosis or dissection. Management with aspirin, statin, blood pressure control.  Diet: Carb-Modified VTE:  Xarelto IVF: None Code: Full  Prior to Admission Living Arrangement: Home, living self Anticipated Discharge Location: Home Barriers to Discharge: medical management  Dispo: Admit patient to Observation with expected length of stay less than 2 midnights.  Signed: Jeral Pinch, DO Internal Medicine Resident PGY-1  11/11/2023, 6:34 PM

## 2023-11-12 ENCOUNTER — Observation Stay (HOSPITAL_BASED_OUTPATIENT_CLINIC_OR_DEPARTMENT_OTHER)

## 2023-11-12 ENCOUNTER — Observation Stay (HOSPITAL_COMMUNITY)

## 2023-11-12 DIAGNOSIS — I700 Atherosclerosis of aorta: Secondary | ICD-10-CM | POA: Diagnosis not present

## 2023-11-12 DIAGNOSIS — I10 Essential (primary) hypertension: Secondary | ICD-10-CM | POA: Diagnosis not present

## 2023-11-12 DIAGNOSIS — R918 Other nonspecific abnormal finding of lung field: Secondary | ICD-10-CM | POA: Diagnosis not present

## 2023-11-12 DIAGNOSIS — N183 Chronic kidney disease, stage 3 unspecified: Secondary | ICD-10-CM | POA: Insufficient documentation

## 2023-11-12 DIAGNOSIS — J984 Other disorders of lung: Secondary | ICD-10-CM | POA: Diagnosis not present

## 2023-11-12 DIAGNOSIS — I160 Hypertensive urgency: Secondary | ICD-10-CM | POA: Diagnosis not present

## 2023-11-12 DIAGNOSIS — K449 Diaphragmatic hernia without obstruction or gangrene: Secondary | ICD-10-CM | POA: Diagnosis not present

## 2023-11-12 LAB — COMPREHENSIVE METABOLIC PANEL WITH GFR
ALT: 22 U/L (ref 0–44)
AST: 20 U/L (ref 15–41)
Albumin: 3.1 g/dL — ABNORMAL LOW (ref 3.5–5.0)
Alkaline Phosphatase: 36 U/L — ABNORMAL LOW (ref 38–126)
Anion gap: 7 (ref 5–15)
BUN: 9 mg/dL (ref 8–23)
CO2: 23 mmol/L (ref 22–32)
Calcium: 8.5 mg/dL — ABNORMAL LOW (ref 8.9–10.3)
Chloride: 108 mmol/L (ref 98–111)
Creatinine, Ser: 1.04 mg/dL — ABNORMAL HIGH (ref 0.44–1.00)
GFR, Estimated: 56 mL/min — ABNORMAL LOW (ref >60)
Glucose, Bld: 193 mg/dL — ABNORMAL HIGH (ref 70–99)
Potassium: 3.6 mmol/L (ref 3.5–5.1)
Sodium: 138 mmol/L (ref 135–145)
Total Bilirubin: 2.5 mg/dL — ABNORMAL HIGH (ref 0.0–1.2)
Total Protein: 5.9 g/dL — ABNORMAL LOW (ref 6.5–8.1)

## 2023-11-12 LAB — GLUCOSE, CAPILLARY
Glucose-Capillary: 183 mg/dL — ABNORMAL HIGH (ref 70–99)
Glucose-Capillary: 140 mg/dL — ABNORMAL HIGH (ref 70–99)
Glucose-Capillary: 124 mg/dL — ABNORMAL HIGH (ref 70–99)
Glucose-Capillary: 187 mg/dL — ABNORMAL HIGH (ref 70–99)

## 2023-11-12 LAB — CBC
HCT: 34.3 % — ABNORMAL LOW (ref 36.0–46.0)
Hemoglobin: 11.8 g/dL — ABNORMAL LOW (ref 12.0–15.0)
MCH: 27.8 pg (ref 26.0–34.0)
MCHC: 34.4 g/dL (ref 30.0–36.0)
MCV: 80.9 fL (ref 80.0–100.0)
Platelets: 186 10*3/uL (ref 150–400)
RBC: 4.24 MIL/uL (ref 3.87–5.11)
RDW: 13.5 % (ref 11.5–15.5)
WBC: 7.3 10*3/uL (ref 4.0–10.5)
nRBC: 0 % (ref 0.0–0.2)

## 2023-11-12 LAB — TSH: TSH: 3.376 u[IU]/mL (ref 0.350–4.500)

## 2023-11-12 MED ORDER — IOHEXOL 350 MG/ML SOLN
100.0000 mL | Freq: Once | INTRAVENOUS | Status: AC | PRN
  Administered 2023-11-12: 100 mL via INTRAVENOUS

## 2023-11-12 NOTE — Progress Notes (Signed)
 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   Overnight Events: None  Interim history: Patient is evaluated at bedside after imaging. She endorses frontal headaches that comes and goes. No chest pain, shortness of breath, abdominal pain, nausea, vomiting. No LE swelling or tenderness.   Objective:  Vital signs in last 24 hours: Vitals:   11/11/23 2210 11/11/23 2212 11/11/23 2335 11/12/23 0505  BP: (!) 168/60 (!) 168/60 (!) 164/61 (!) 158/61  Pulse:   66 69  Resp:   18 16  Temp:   98.5 F (36.9 C) 98.6 F (37 C)  TempSrc:   Oral Oral  SpO2:   95% 96%  Weight:      Height:       Supplemental O2: Room Air SpO2: 96 %   Physical Exam:   Constitutional: Appears stable, laying in bed, no acute distress  HENT: normocephalic atraumatic, mucous membranes moist Eyes: conjunctiva non-erythematous Neck: supple, No Carotid Bruit  Cardiovascular: regular rate and rhythm, no m/r/g Pulmonary/Chest: normal work of breathing on room air, lungs clear to auscultation bilaterally Abdominal: soft, non-tender, non-distended, bowel sounds present MSK: normal bulk and tone, +swelling digits  Neurological: PERRLA, EOMI, CN II-XII intact, 5/5 grip strength, 5/5 b/l UE and LE strength.  Skin: warm and dry  Filed Weights   11/11/23 1245 11/11/23 1944  Weight: 77.1 kg 79.8 kg     Intake/Output Summary (Last 24 hours) at 11/12/2023 0645 Last data filed at 11/12/2023 0330 Gross per 24 hour  Intake 240 ml  Output 900 ml  Net -660 ml   Net IO Since Admission: -660 mL [11/12/23 0645]  Pertinent Labs:    Latest Ref Rng & Units 11/12/2023    3:00 AM  11/11/2023    2:45 PM 12/29/2022   10:42 AM  CBC  WBC 4.0 - 10.5 K/uL 7.3  6.7  6.9   Hemoglobin 12.0 - 15.0 g/dL 60.4  54.0  98.1   Hematocrit 36.0 - 46.0 % 34.3  36.7  37.7   Platelets 150 - 400 K/uL 186  168  189        Latest Ref Rng & Units 11/12/2023    3:00 AM 11/11/2023    2:45 PM 12/29/2022   10:42 AM  CMP  Glucose 70 - 99 mg/dL 191  478  295   BUN 8 - 23 mg/dL 9  10  18    Creatinine 0.44 - 1.00 mg/dL 6.21  3.08  6.57   Sodium 135 - 145 mmol/L 138  139  142   Potassium 3.5 - 5.1 mmol/L 3.6  3.8  4.1   Chloride 98 - 111 mmol/L 108  106  100   CO2 22 - 32 mmol/L 23  26  23    Calcium 8.9 - 10.3 mg/dL 8.5  8.6  9.4   Total Protein 6.5 - 8.1 g/dL 5.9     Total Bilirubin 0.0 - 1.2 mg/dL 2.5     Alkaline Phos 38 - 126 U/L 36     AST 15 - 41 U/L 20     ALT 0 - 44 U/L 22       Imaging: DG Chest 2 View Result Date: 11/11/2023  CLINICAL DATA:  Chest pain. EXAM: CHEST - 2 VIEW COMPARISON:  01/25/2022. FINDINGS: The heart size and mediastinal contours are within normal limits. No focal consolidation, pleural effusion, or pneumothorax. Degenerative changes of the mid to lower thoracic spine. No acute osseous abnormality. IMPRESSION: No acute cardiopulmonary findings. Electronically Signed   By: Hart Robinsons M.D.   On: 11/11/2023 14:10    Assessment/Plan:   Principal Problem:   Hypertensive urgency Active Problems:   Hypothyroidism   Essential hypertension   Type 2 diabetes mellitus (HCC)   HLD (hyperlipidemia)   Left-sided fourth cranial nerve palsy  Madison Jefferson is a 73 y.o. 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    Acute Severe Symptomatic hypertension   Headaches  Subxiphoid pain   TSH WNL. Renal artery duplex US scheduled today. Etiology can be likely due to medication noncompliance vs diet noncompliance vs FMD complications. With patient's history FMD and presenting symptoms of epigastric/subxiphoid pain and headaches, will check CTA a/p to rule out any aneurysm and/or dissection in the vessels. Her BP is elevated this morning because she did not receive any medications until late afternoon. She continues to endorse frontal headaches, no blurry vision. Neuro exam unremarkable. No weakness noted.   -Follow up on US renal artery duplex  -Follow up on CTA a/p  -If concern for aneurysm and/or dissection, will promptly consult [facility]  -IV hydralazine 10 mg q4h for SBP > 160 and DBP > 110  -Continue home medication HCTZ 25 mg -amlodipine 10 mg -irbesartan 300 mg daily  -Continue home medication Toprol 100 mg daily  -Continue Spironolactone 25 mg daily starting today   Hematuria - resolved  UA negative for nitrites, leukocytes, and hgb. Denies any symptoms presently.    Type 2 diabetes Hgb A1c 6.1 12/2022, on home medication metformin.  -Will hold home medication metformin -SSI moderate  -Check CBGs    HLD -Continue home medication Crestor 10 mg daily   History of fibromuscular dysplasia of both carotid arteries History of left 4th cranial nerve palsy History of ischemic 6th nerve palsy Patient follows neurology, last seen on 10/2022, had a CT angio head and neck on 02/2022 negative for LVO, showed mild atherosclerotic calcification of the bilateral carotid bifurcation and mild- moderate stenosis of the bilateral cavernous carotid.  She also had evidence of FMD on bilateral internal carotid artery without stenosis or dissection. Management with aspirin, statin, blood pressure control.    Diet:  NPO  IVF: None VTE:  Xarelto  Wounds: None  Code: Full PT/OT recs: none TOC recs: none  Dispo: Anticipated discharge to Home in 1 days pending  imaging.   Signature: Jeral Pinch, D.O.  Internal Medicine Resident, PGY-1 [facility]  Pager: [phone] 6:45 AM, 11/12/2023   Please contact the on call pager after 5 pm and on weekends at [phone].

## 2023-11-12 NOTE — Care Management Obs Status (Signed)
 MEDICARE OBSERVATION STATUS NOTIFICATION   Patient Details  Name: Madison Jefferson MRN: [id number] Date of Birth: Mar 17, 1951   Medicare Observation Status Notification Given:  Yes    Lawerance Sabal, RN 11/12/2023, 1:32 PM

## 2023-11-12 NOTE — Plan of Care (Signed)
  Problem: Coping: Goal: Ability to adjust to condition or change in health will improve Outcome: Progressing   Problem: Health Behavior/Discharge Planning: Goal: Ability to manage health-related needs will improve Outcome: Progressing   Problem: Metabolic: Goal: Ability to maintain appropriate glucose levels will improve Outcome: Progressing

## 2023-11-13 LAB — BASIC METABOLIC PANEL WITH GFR
Anion gap: 12 (ref 5–15)
BUN: 10 mg/dL (ref 8–23)
CO2: 23 mmol/L (ref 22–32)
Calcium: 8.9 mg/dL (ref 8.9–10.3)
Chloride: 105 mmol/L (ref 98–111)
Creatinine, Ser: 1.2 mg/dL — ABNORMAL HIGH (ref 0.44–1.00)
GFR, Estimated: 47 mL/min — ABNORMAL LOW (ref >60)
Glucose, Bld: 126 mg/dL — ABNORMAL HIGH (ref 70–99)
Potassium: 3.7 mmol/L (ref 3.5–5.1)
Sodium: 140 mmol/L (ref 135–145)

## 2023-11-13 LAB — GLUCOSE, CAPILLARY
Glucose-Capillary: 159 mg/dL — ABNORMAL HIGH (ref 70–99)
Glucose-Capillary: 272 mg/dL — ABNORMAL HIGH (ref 70–99)

## 2023-11-13 LAB — CBC
HCT: 36.6 % (ref 36.0–46.0)
Hemoglobin: 12.5 g/dL (ref 12.0–15.0)
MCH: 28 pg (ref 26.0–34.0)
MCHC: 34.2 g/dL (ref 30.0–36.0)
MCV: 81.9 fL (ref 80.0–100.0)
Platelets: 177 10*3/uL (ref 150–400)
RBC: 4.47 MIL/uL (ref 3.87–5.11)
RDW: 14.1 % (ref 11.5–15.5)
WBC: 6.3 10*3/uL (ref 4.0–10.5)
nRBC: 0 % (ref 0.0–0.2)

## 2023-11-13 MED ORDER — SPIRONOLACTONE 25 MG PO TABS
25.0000 mg | ORAL_TABLET | Freq: Every day | ORAL | Status: DC
  Administered 2023-11-13: 25 mg via ORAL
  Filled 2023-11-13: qty 1

## 2023-11-13 MED ORDER — SPIRONOLACTONE 25 MG PO TABS: 50.0000 mg | ORAL_TABLET | Freq: Every day | ORAL | Status: DC

## 2023-11-13 MED ORDER — SPIRONOLACTONE 25 MG PO TABS: 25.0000 mg | ORAL_TABLET | Freq: Every day | ORAL | 0 refills | Status: DC

## 2023-11-13 MED ORDER — OLMESARTAN-AMLODIPINE-HCTZ 40-10-25 MG PO TABS: 1.0000 | ORAL_TABLET | Freq: Every day | ORAL | 0 refills | Status: DC

## 2023-11-13 NOTE — Discharge Summary (Addendum)
 Name: Madison Jefferson MRN: [id number] DOB: 10/22/1950 73 y.o. PCP: Madison Eon, MD  Date of Admission: 11/11/2023 12:41 PM Date of Discharge: 11/13/2023 Attending Physician: Dr. Antony Contras  Discharge Diagnosis: Principal Problem:   Hypertensive urgency Active Problems:   Hypothyroidism   Essential hypertension   Type 2 diabetes mellitus (HCC)   HLD (hyperlipidemia)   Fibromuscular dysplasia of both carotid arteries (HCC)   Left-sided fourth cranial nerve palsy   CKD (chronic kidney disease) stage 3, GFR 30-59 ml/min (HCC)    Discharge Medications: Allergies as of 11/13/2023       Reactions   Oysters [shellfish Allergy] Rash        Medication List     TAKE these medications    albuterol 108 (90 Base) MCG/ACT inhaler Commonly known as: VENTOLIN HFA INHALE 1 TO 2 PUFFS INTO THE LUNGS EVERY 6 HOURS AS NEEDED FOR WHEEZING OR SHORTNESS OF BREATH   Aspirin Low Dose 81 MG tablet Generic drug: aspirin EC TAKE 1 TABLET BY MOUTH EVERY DAY. SWALLOW WHOLE   glucose blood test strip Check sugars daily.   ibuprofen 200 MG tablet Commonly known as: ADVIL Take 200 mg by mouth daily as needed for headache or moderate pain (pain score 4-6).   levothyroxine 75 MCG tablet Commonly known as: SYNTHROID Take 1 tablet (75 mcg total) by mouth daily before breakfast.   loratadine 10 MG tablet Commonly known as: CLARITIN Take 1 tablet (10 mg total) by mouth daily.   metFORMIN 500 MG 24 hr tablet Commonly known as: GLUCOPHAGE-XR Take 1 tablet (500 mg total) by mouth 2 (two) times daily with a meal.   metoprolol succinate 100 MG 24 hr tablet Commonly known as: TOPROL-XL TAKE 1 TABLET BY MOUTH EVERY DAY WITH OR IMMEDIATELY FOLLOWING A MEAL   Olmesartan-amLODIPine-HCTZ 40-10-25 MG Tabs Take 1 tablet by mouth daily.   polyethylene glycol 17 g packet Commonly known as: MiraLax Take 17 g by mouth daily as needed for moderate constipation.   rosuvastatin 10 MG tablet Commonly  known as: Crestor Take 1 tablet (10 mg total) by mouth daily.   spironolactone 25 MG tablet Commonly known as: ALDACTONE Take 1 tablet (25 mg total) by mouth daily. Start taking on: November 14, 2023   VITAMIN D-3 PO Take 1 capsule by mouth daily.        Disposition and follow-up:   [name] was discharged from [facility] in Good condition.  At the hospital follow up visit please address:  1.  Follow-up:  a.  Hypertension: Presented with hypertensive urgency.  Required IV hydralazine.  Did not require drip.  Started spironolactone 25 mg daily.  Continued home olmesartan-amlodipine-HCTZ, and added spironolactone 25 mg daily at discharge.  Continue to titrate medications outpatient.  No concern for aortic dissection inpatient.  No concern for renal artery stenosis inpatient.  Thought to be related to decongestants patient is taking outpatient.  Creatinine did elevate slightly after initiation of spironolactone, which is expected, follow-up outpatient.   b.  Elevated creatinine: On secondary of hospitalization, creatinine bumped up slightly.  Not AKI range.  Likely in setting of initiation of spironolactone.  Follow-up with BMP outpatient.  2.  Labs / imaging needed at time of follow-up: BMP  3.  Pending labs/ test needing follow-up: N/A  4.  Medication Changes  1) Added spironolactone 25 mg at discharge.  No other med changes.  Follow-up Appointments:  Follow-up Information     Madison Eon, MD. Nyra Capes  in 3 day(s).   Specialty: Internal Medicine Why: Please go to your appointment with Dr. Lafonda Mosses on 11-16-2023 Contact information: [address] [place] [address] [phone]                 Hospital Course by problem list: Madison Jefferson is a 73 y.o. 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    Acute Severe symptomatic hypertension  Patient presented from home with concerns of headaches and found to have elevated blood pressures into the 250s systolics.  Fortunately patient did not have any evidence of endorgan damage.  Home medications include olmesartan-amlodipine-HCTZ 40-10-25 mg as well as metoprolol succinate 100 mg daily.  She reports adherence to this medication.  During hospital course patient was started on IV hydralazine as well as spironolactone.  Worked up for etiology.  TSH was normal.  She had imaging which was unrevealing.  We ruled out acute dissections as well as renal artery stenosis.  Patient endorsed taking decongestions, which likely increased her blood pressure.  We encouraged her to remain away from decongestants.  Blood pressure during hospitalization decreased down into the 160s systolics.  Patient will be discharged with spironolactone 25 mg daily as well as metoprolol succinate 100 mg daily, olmesartan-amlodipine-HCTZ 40-10-25 mg daily.  Patient has close follow-up with PCP on 11/16/2023.   Elevated creatinine On day 2 of hospitalization, patient did have elevated creatinine likely from the initiation of spironolactone.  Can follow-up outpatient.  Not concern for AKI.  Hematuria Patient did endorse having hematuria prior to hospitalization.  UA was unrevealing.  No acute concerns.   Type 2 diabetes Patient has a past medical history of type 2 diabetes mellitus.  Held home metformin during hospitalization.  Kept on sliding scale.  Patient was discharged back on home metformin.   HLD Continued home Crestor 10 mg daily inpatient.  Discharged with same regimen.   History of fibromuscular dysplasia of both carotid arteries History of left 4th cranial nerve palsy History of ischemic  6th nerve palsy During hospitalization patient had CTA chest, abdomen, pelvis which was unrevealing for any dissections.  Patient also had vascular ultrasound of kidneys which did not show any evidence of renal artery stenosis.  Patient follows neurology, last seen on 10/2022, had a CT angio head and neck on 02/2022 negative for LVO, showed mild atherosclerotic calcification of the bilateral carotid bifurcation and mild- moderate stenosis of the bilateral cavernous carotid.  Patient continues on aspirin, statin, antihypertensives at discharge.  Discharge Subjective:  Patient evaluated bedside this morning.  She states her headache is improved.  She denies any vision changes, sensation losses, or other neurological deficits.  She states she is feeling well and ready to go home.  She states her granddaughter can come pick her up.  Discharge Exam:   BP (!) 171/62   Pulse 65   Temp 97.9 F (36.6 C) (Oral)   Resp 18   Ht 5\' 6"  (1.676 m)   Wt 78.6 kg   SpO2 95%   BMI 27.97 kg/m  Constitutional: Resting in bed, no acute distress HENT: normocephalic atraumatic Cardiovascular: regular rate and rhythm, no m/r/g Pulmonary/Chest: normal work of breathing on room air, lungs clear to  auscultation bilaterally  Pertinent Labs, Studies, and Procedures:     Latest Ref Rng & Units 11/13/2023    2:52 AM 11/12/2023    3:00 AM 11/11/2023    2:45 PM  CBC  WBC 4.0 - 10.5 K/uL 6.3  7.3  6.7   Hemoglobin 12.0 - 15.0 g/dL 40.9  81.1  91.4   Hematocrit 36.0 - 46.0 % 36.6  34.3  36.7   Platelets 150 - 400 K/uL 177  186  168        Latest Ref Rng & Units 11/13/2023    2:52 AM 11/12/2023    3:00 AM 11/11/2023    2:45 PM  CMP  Glucose 70 - 99 mg/dL 782  956  213   BUN 8 - 23 mg/dL 10  9  10    Creatinine 0.44 - 1.00 mg/dL 0.86  5.78  4.69   Sodium 135 - 145 mmol/L 140  138  139   Potassium 3.5 - 5.1 mmol/L 3.7  3.6  3.8   Chloride 98 - 111 mmol/L 105  108  106   CO2 22 - 32 mmol/L 23  23  26    Calcium 8.9 -  10.3 mg/dL 8.9  8.5  8.6   Total Protein 6.5 - 8.1 g/dL  5.9    Total Bilirubin 0.0 - 1.2 mg/dL  2.5    Alkaline Phos 38 - 126 U/L  36    AST 15 - 41 U/L  20    ALT 0 - 44 U/L  22      VAS US RENAL ARTERY DUPLEX Result Date: 11/12/2023 ABDOMINAL VISCERAL Patient Name:  ANAYSIA GERMER  Date of Exam:   11/12/2023 Medical Rec #: [id number]     Accession #:    [id number] Date of Birth: 1951/07/22     Patient Gender: F Patient Age:   60 years Exam Location:  [facility] Procedure:      VAS US RENAL ARTERY DUPLEX Referring Phys: [id number] CAROLYN GUILLOUD -------------------------------------------------------------------------------- High Risk Factors: Hypertension, hyperlipidemia, Diabetes. Other Factors: Chest pain, headaches. Limitations: Air/bowel gas. Comparison Study: No prior exam. Performing Technologist: Fernande Bras  Examination Guidelines: A complete evaluation includes B-mode imaging, spectral Doppler, color Doppler, and power Doppler as needed of all accessible portions of each vessel. Bilateral testing is considered an integral part of a complete examination. Limited examinations for reoccurring indications may be performed as noted.  Duplex Findings: +--------------------+--------+--------+------+--------+ Mesenteric          PSV cm/sEDV cm/sPlaqueComments +--------------------+--------+--------+------+--------+ Aorta Mid             105                          +--------------------+--------+--------+------+--------+ Celiac Artery Origin  173                          +--------------------+--------+--------+------+--------+ SMA Proximal          168      15                  +--------------------+--------+--------+------+--------+    +------------------+--------+--------+-------+ Right Renal ArteryPSV cm/sEDV cm/sComment +------------------+--------+--------+-------+ Origin              135                   +------------------+--------+--------+-------+  Proximal            136  26           +------------------+--------+--------+-------+ Mid                 128      20           +------------------+--------+--------+-------+ Distal              131      13           +------------------+--------+--------+-------+ +-----------------+--------+--------+-------+ Left Renal ArteryPSV cm/sEDV cm/sComment +-----------------+--------+--------+-------+ Origin             104                   +-----------------+--------+--------+-------+ Proximal            59      13           +-----------------+--------+--------+-------+ Mid                 53      10           +-----------------+--------+--------+-------+ Distal              70      17           +-----------------+--------+--------+-------+ +------------+--------+--------+----+-----------+--------+--------+----+ Right KidneyPSV cm/sEDV cm/sRI  Left KidneyPSV cm/sEDV cm/sRI   +------------+--------+--------+----+-----------+--------+--------+----+ Upper Pole  18      6       0.67Upper Pole 17      2       0.90 +------------+--------+--------+----+-----------+--------+--------+----+ Mid         19      6       0.        18      5       0.73 +------------+--------+--------+----+-----------+--------+--------+----+ Lower Pole  26      5       0.76Lower Pole 21      6       0.72 +------------+--------+--------+----+-----------+--------+--------+----+ Hilar       30      7       0.78Hilar      22      6       0.74 +------------+--------+--------+----+-----------+--------+--------+----+ +------------------+-----+------------------+-----+ Right Kidney           Left Kidney             +------------------+-----+------------------+-----+ RAR                    RAR                     +------------------+-----+------------------+-----+ RAR (manual)      1.3  RAR (manual)      0.6    +------------------+-----+------------------+-----+ Cortex                 Cortex                  +------------------+-----+------------------+-----+ Cortex thickness       Corex thickness         +------------------+-----+------------------+-----+ Kidney length (cm)11.88Kidney length (cm)11.20 +------------------+-----+------------------+-----+   Summary: Renal:  Right: Normal size right kidney. No evidence of right renal artery        stenosis. Normal right Resisitive Index. Left:  Normal size of left kidney. No evidence of left renal artery        stenosis. Normal left Resistive Index. Mesenteric: Normal Celiac artery and Superior Mesenteric artery findings.  *See table(s) above for  measurements and observations.     Preliminary    CT Angio Chest/Abd/Pel for Dissection W and/or W/WO Result Date: 11/12/2023 CLINICAL DATA:  Acute aortic syndrome suspected. Hypertensive urgency. Subxiphoid pain. EXAM: CT ANGIOGRAPHY CHEST, ABDOMEN AND PELVIS TECHNIQUE: Non-contrast CT of the chest was initially obtained. Multidetector CT imaging through the chest, abdomen and pelvis was performed using the standard protocol during bolus administration of intravenous contrast. Multiplanar reconstructed images and MIPs were obtained and reviewed to evaluate the vascular anatomy. RADIATION DOSE REDUCTION: This exam was performed according to the departmental dose-optimization program which includes automated exposure control, adjustment of the mA and/or kV according to patient size and/or use of iterative reconstruction technique. CONTRAST:  OMNIPAQUE IOHEXOL 350 MG/ML SOLN COMPARISON:  CT chest abdomen pelvis 01/26/2022 FINDINGS: CTA CHEST FINDINGS Cardiovascular: Normal caliber of the thoracic aorta without dissection or intramural hematoma. Typical three-vessel arch anatomy. Great vessels are widely patent. Proximal vertebral arteries are patent. Minimal atherosclerotic disease in the thoracic aorta. Main  pulmonary arteries are patent. Heart size is normal. No significant pericardial effusion. Mediastinum/Nodes: No mediastinal or hilar lymph node enlargement. Esophagus is unremarkable. Question a small hiatal hernia. No axillary lymph node enlargement. Lungs/Pleura: Trachea and mainstem bronchi are patent. Stable 2 mm nodule in the right upper lobe on image 20/17. New small patchy densities in the right middle lobe on image 79/7. Stable punctate nodule at the left lung base on image 92/7. No pleural effusions. Punctate nodular density in the lingula on image 83/7 is likely stable. Musculoskeletal: No acute bone abnormality. Review of the MIP images confirms the above findings. CTA ABDOMEN AND PELVIS FINDINGS VASCULAR Aorta: Atherosclerotic calcifications involving the abdominal aorta without aneurysm or dissection. No evidence for wall thickening or vasculitis. Celiac: Patent with calcified plaque at the origin. The degree of stenosis is at least 50%. SMA: Patent without evidence of aneurysm, dissection, vasculitis or significant stenosis. Renals: Both renal arteries are patent without evidence of aneurysm, dissection, vasculitis, fibromuscular dysplasia or significant stenosis. IMA: Patent. Probable stenosis near the origin due to the aortic plaque. Inflow: Patent without evidence of aneurysm, dissection, vasculitis or significant stenosis. Proximal outflow: Proximal femoral arteries are patent bilaterally. Early takeoff of the profunda femoral arteries bilaterally. Left profunda artery probably takes off above the left inguinal ligament. Veins: No obvious venous abnormality within the limitations of this arterial phase study. Review of the MIP images confirms the above findings. NON-VASCULAR Hepatobiliary: Cholecystectomy. Normal appearance of the liver. No significant biliary dilatation. Pancreas: Unremarkable. No pancreatic ductal dilatation or surrounding inflammatory changes. Spleen: Normal in size without  focal abnormality. Adrenals/Urinary Tract: Normal adrenal glands. Normal appearance of both kidneys without hydronephrosis. No suspicious renal lesions. Limited evaluation for kidney stones. Normal urinary bladder. Stomach/Bowel: No gross abnormality to the appendix. There is a surgical clip near the terminal ileum which could be related to previous cholecystectomy. No bowel dilatation or obstruction. No evidence for acute bowel inflammation. There are metallic clips near the gastric cardia and these likely represent endoscopic clips. Suspect a small hiatal hernia. Lymphatic: No lymph node enlargement in the abdomen or pelvis. Reproductive: Status post hysterectomy. No adnexal masses. Other: Question trace pelvic fluid on image 237/6. No other fluid is identified. Negative for free air. Musculoskeletal: No acute bone abnormality. Stable sclerotic focus in the right ilium likely represents a bone island. Review of the MIP images confirms the above findings. IMPRESSION: 1. Negative for an aortic dissection or aneurysm. 2. Atherosclerotic disease involving the thoracic and abdominal  aorta. Aortic Atherosclerosis (ICD10-I70.0). 3. Bilateral renal arteries are widely patent without evidence for fibromuscular dysplasia. 4. 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 Difficult to measure the degree of stenosis but greater than 50%. 5. Small patchy densities in the right middle lobe. Findings are nonspecific but could represent atelectasis or mild infectious/inflammatory process. 6. Trace pelvic fluid is nonspecific. 7. Small hiatal hernia. Evidence for endoscopic clips near the GE junction and gastric cardia. Electronically Signed   By: Richarda Overlie M.D.   On: 11/12/2023 15:34   DG Chest 2 View Result Date: 11/11/2023 CLINICAL DATA:  Chest pain. EXAM: CHEST - 2 VIEW COMPARISON:  01/25/2022. FINDINGS: The heart size and mediastinal contours are within normal limits. No focal consolidation, pleural effusion, or  pneumothorax. Degenerative changes of the mid to lower thoracic spine. No acute osseous abnormality. IMPRESSION: No acute cardiopulmonary findings. Electronically Signed   By: Hart Robinsons M.D.   On: 11/11/2023 14:10     Discharge Instructions: Discharge Instructions     Call MD for:  persistant dizziness or light-headedness   Complete by: As directed    Call MD for:  persistant nausea and vomiting   Complete by: As directed    Call MD for:  severe uncontrolled pain   Complete by: As directed    Call MD for:  temperature >100.4   Complete by: As directed    Diet - low sodium heart healthy   Complete by: As directed    Discharge instructions   Complete by: As directed    Ms. Ashley Jacobs, It was a pleasure taking care of you at [facility]. You were admitted for high blood pressure and treated with medications.  He also had imaging to rule out any severe diseases that could be causing this. We are discharging you home now that you are doing better. Please follow the following instructions.   1) Regarding your medications, I have not changed any current medications you are on.  Please take all of your other medications as prescribed.  I have added a new medication named spironolactone.  Please take 1 pill every day in the evening.  I have sent this to your pharmacy.  2) You have a follow-up appointment with Dr. Lafonda Mosses in the [facility] clinic, this appointment is on 11/16/2023 at 9:15 AM.  Please show up.  If you have any of these following symptoms, please call us or seek care at an emergency department: -Chest Pain -Difficulty Breathing -Worsening abdominal pain -Syncope (passing out) -Drooping of face -Slurred speech -Sudden weakness in your leg or arm -Fever -Chills -blood in the stool -dark black, sticky stool  If you have any questions or concerns, call our clinic at [phone] or after hours call [phone] and ask for the internal medicine resident on call.  I am  glad you are feeling better. It was a pleasure taking care for you. I wish a good recovery and good health!   Take care,  Dr. Modena Slater, DO   Increase activity slowly   Complete by: As directed        Signed: Modena Slater, DO 11/13/2023, 10:06 AM   Pager: [phone]

## 2023-11-13 NOTE — Discharge Instructions (Addendum)
 [name], It was a pleasure taking care of you at [facility]. You were admitted for high blood pressure and treated with medications.  He also had imaging to rule out any severe diseases that could be causing this. We are discharging you home now that you are doing better. Please follow the following instructions.   1) Regarding your medications, I have not changed any current medications you are on.  Please take all of your other medications as prescribed.  I have added a new medication named spironolactone.  Please take 1 pill every day in the evening.  I have sent this to your pharmacy.  2) You have a follow-up appointment with Dr. Lafonda Mosses in the [facility] clinic, this appointment is on 11/16/2023 at 9:15 AM.  Please show up.  If you have any of these following symptoms, please call us or seek care at an emergency department: -Chest Pain -Difficulty Breathing -Worsening abdominal pain -Syncope (passing out) -Drooping of face -Slurred speech -Sudden weakness in your leg or arm -Fever -Chills -blood in the stool -dark black, sticky stool  If you have any questions or concerns, call our clinic at [phone] or after hours call [phone] and ask for the internal medicine resident on call.  I am glad you are feeling better. It was a pleasure taking care for you. I wish a good recovery and good health!   Take care,  Dr. Modena Slater, DO

## 2023-11-14 ENCOUNTER — Other Ambulatory Visit: Payer: Self-pay

## 2023-11-14 ENCOUNTER — Telehealth: Payer: Self-pay

## 2023-11-14 ENCOUNTER — Ambulatory Visit: Payer: Self-pay

## 2023-11-14 DIAGNOSIS — I10 Essential (primary) hypertension: Secondary | ICD-10-CM

## 2023-11-14 MED ORDER — OLMESARTAN-AMLODIPINE-HCTZ 40-10-25 MG PO TABS: 1.0000 | ORAL_TABLET | Freq: Every day | ORAL | 1 refills | Status: DC

## 2023-11-14 MED ORDER — SPIRONOLACTONE 25 MG PO TABS: 25.0000 mg | ORAL_TABLET | Freq: Every day | ORAL | 1 refills | Status: DC

## 2023-11-14 NOTE — Telephone Encounter (Signed)
 Pharmacy requesting a 90 day supply  Medication sent to pharmacy.

## 2023-11-14 NOTE — Telephone Encounter (Signed)
 Copied From CRM [id number]. Reason for Triage: Patient was discharge from hospital on 11/13/23 and the physician name Dr Modena Slater at the hospital was suppose to fax over prescription for patient when she was discharged , the prescription is not at pharmacy and pharmacist trying to reach out to the doctor at the hospital    Patient is needing her medication, patient stated her blood pressure is not stable   Needs assistance getting new medications filled that were prescribed to her upon discharge from hospital.

## 2023-11-14 NOTE — Telephone Encounter (Signed)
 Patient called and advised spironolactone and Olmesartan-amlodipine-hydrochlorothiazide were the 2 medications that Dr. Allena Katz sent to the pharmacy, so Dr. Lafonda Mosses resent those today. She says [facility] will have 1 ready in about an hour and the other tomorrow.   Copied from CRM [id number]. Topic: Clinical - Prescription Issue >> Nov 14, 2023  2:39 PM Dennison Nancy wrote: Reason for CRM: Patient was discharge from hospital on 11/13/23 and the physician name Dr Modena Slater at the hospital was suppose to fax over prescription for patient when she was discharged , the prescription is not at pharmacy and pharmacist trying to reach out to the doctor at the hospital   Patient not sure what medication she will be getting    Patient uses the pharmacy : [id number] - [place], [place] - [address] AT [facility] OF [address] [place] [address] Phone: [phone] Fax: [phone] Hours: Open 24 hours  Please contact patient at [phone]

## 2023-11-14 NOTE — Addendum Note (Signed)
 Addended byCala Bradford on: 11/14/2023 10:07 AM   Modules accepted: Orders

## 2023-11-15 NOTE — Progress Notes (Unsigned)
 [facility]: Clinic Note  Subjective:  History of Present Illness: Madison Jefferson is a 73 y.o. year old female who presents for hospital follow up after hypertensive urgency.  I reviewed the hospital records. CTA C/A/P negative for dissection. Hospital team continued home olmesartan-amlodipine-hydrochlorothiazide and started spironolactone 25mg  daily at discharge.   She brought all meds with her today. She doesn't have the olmesartan-amlodipine-hydrochlorothiazide, so I'm wondering if she actually ran out of this, and this was why she presented to the hospital with such hypertension.   BP 149/54 here.   Her other concern is acute left 5th toe pain that started when she left the hospital. Pain is worse with movement and ambulation. She has taken ibuprofen with some relief. Wearing supportive shoes.    Please refer to Assessment and Plan below for full details in Problem-Based Charting.   Past Medical History:  Patient Active Problem List   Diagnosis Date Noted   CKD (chronic kidney disease) stage 3, GFR 30-59 ml/min (HCC) 11/12/2023   Hypertensive urgency 11/11/2023   Left-sided fourth cranial nerve palsy 04/06/2023   Fibromuscular dysplasia of both carotid arteries (HCC) 12/29/2022   Healthcare maintenance 09/29/2022   Benign neoplasm of stomach 09/28/2022   HLD (hyperlipidemia) 02/02/2022   Gastric polyp    Right abducens nerve palsy 11/15/2019   Type 2 diabetes mellitus (HCC) 09/20/2013   Diverticulosis    Obesity, unspecified 10/12/2012   Allergic rhinitis 10/21/2009   Asthma 07/23/2009   Hypothyroidism 09/05/2008   Essential hypertension 09/25/2007      Medications:  Current Outpatient Medications:    albuterol (VENTOLIN HFA) 108 (90 Base) MCG/ACT inhaler, INHALE 1 TO 2 PUFFS INTO THE LUNGS EVERY 6 HOURS AS NEEDED FOR WHEEZING OR SHORTNESS OF BREATH, Disp: 20.1 g, Rfl: 1   aspirin EC (ASPIRIN LOW DOSE) 81 MG tablet, Take 1 tablet (81 mg total) by  mouth daily. Swallow whole., Disp: 90 tablet, Rfl: 3   Cholecalciferol (VITAMIN D-3 PO), Take 1 capsule by mouth daily., Disp: , Rfl:    glucose blood test strip, Check sugars daily., Disp: 100 each, Rfl: 3   ibuprofen (ADVIL) 200 MG tablet, Take 200 mg by mouth daily as needed for headache or moderate pain (pain score 4-6)., Disp: , Rfl:    levothyroxine (SYNTHROID) 75 MCG tablet, Take 1 tablet (75 mcg total) by mouth daily before breakfast., Disp: 90 tablet, Rfl: 3   loratadine (CLARITIN) 10 MG tablet, Take 1 tablet (10 mg total) by mouth daily., Disp: 90 tablet, Rfl: 3   metFORMIN (GLUCOPHAGE-XR) 500 MG 24 hr tablet, Take 1 tablet (500 mg total) by mouth 2 (two) times daily with a meal., Disp: 180 tablet, Rfl: 3   metoprolol succinate (TOPROL-XL) 100 MG 24 hr tablet, TAKE 1 TABLET BY MOUTH EVERY DAY WITH OR IMMEDIATELY FOLLOWING A MEAL, Disp: 90 tablet, Rfl: 1   Olmesartan-amLODIPine-HCTZ 40-10-25 MG TABS, Take 1 tablet by mouth daily., Disp: 90 tablet, Rfl: 1   polyethylene glycol (MIRALAX) 17 g packet, Take 17 g by mouth daily as needed for moderate constipation., Disp: 14 each, Rfl: 0   rosuvastatin (CRESTOR) 20 MG tablet, Take 1 tablet (20 mg total) by mouth daily., Disp: 90 tablet, Rfl: 3   spironolactone (ALDACTONE) 25 MG tablet, Take 1 tablet (25 mg total) by mouth daily., Disp: 90 tablet, Rfl: 1   Allergies: Allergies  Allergen Reactions   Oysters [Shellfish Allergy] Rash     Objective:   Vitals: Vitals:  11/16/23 0914 11/16/23 0940  BP: (!) 152/63 (!) 149/54  Pulse: 62 (!) 59  Temp: 98.2 F (36.8 C)   SpO2: 99%      Physical Exam: Physical Exam Constitutional:      Appearance: Normal appearance.  Cardiovascular:     Rate and Rhythm: Normal rate and regular rhythm.  Pulmonary:     Effort: Pulmonary effort is normal.     Breath sounds: Normal breath sounds.  Musculoskeletal:     Comments: L 5th toe with pain to palpation over plantar surface. No synovitis or  lesions seen.  Neurological:     Mental Status: She is alert.  Psychiatric:        Mood and Affect: Mood normal.        Behavior: Behavior normal.      Data: Labs, imaging, and micro were reviewed in Epic. Refer to Assessment and Plan below for full details in Problem-Based Charting.  Assessment & Plan:  Essential hypertension - Patient's DBP's tend to run lower, which I want to be mindful of - She was hospitalized in 10/2023 with hypertensive urgency. I think this was in the setting of running out of Olmesartan-Amlodipine-HCTZ 40-10-25 daily and not realizing this, because she did not have the medicine at her hospital follow up  - Continue Olmesartan-Amlodipine-HCTZ 40-10-25 daily (sent in today) - Continue Metoprolol XL 100mg  daily - Continue Spironolactone 25mg  daily, which was started in the hospital - BMP today  - VBCI Pharmacy referral to call patient to ensure she has all blood pressure medicines   Hypothyroidism - chronic and stable - TSH normal in 10/2023 - continue levothyroxine daily  Allergic rhinitis - Continue Claritin daily  Asthma - chronic and stable - Albuterol inhaler prn  Type 2 diabetes mellitus (HCC) - chronic and stable - A1C 8.2 today, my goal is <8 - continue Metformin 1000mg  in AM and 500mg  in PM - attempted [facility] today, but patient unable to void in the cup, so will try again next time  HLD (hyperlipidemia) - I had decreased Rosuvastatin from 20 to 10mg  daily last time, but she is still taking 20mg  daily and muscle cramps have resolved - lipid panel today - continue rosuvastatin 20mg  daily  Fibromuscular dysplasia of both carotid arteries (HCC) - She follows with Neurology for this.  - Continue aspirin 81mg  daily, crestor, and HTN mgmt     Patient will follow up in 2 months for hypertension  Mercie Eon, MD

## 2023-11-16 ENCOUNTER — Encounter: Payer: Self-pay | Admitting: Internal Medicine

## 2023-11-16 ENCOUNTER — Other Ambulatory Visit: Payer: Self-pay

## 2023-11-16 ENCOUNTER — Ambulatory Visit (INDEPENDENT_AMBULATORY_CARE_PROVIDER_SITE_OTHER): Payer: 59 | Admitting: Internal Medicine

## 2023-11-16 VITALS — BP 149/54 | HR 59 | Temp 98.2°F | Ht 61.0 in | Wt 173.3 lb

## 2023-11-16 DIAGNOSIS — J45909 Unspecified asthma, uncomplicated: Secondary | ICD-10-CM

## 2023-11-16 DIAGNOSIS — I10 Essential (primary) hypertension: Secondary | ICD-10-CM

## 2023-11-16 DIAGNOSIS — J301 Allergic rhinitis due to pollen: Secondary | ICD-10-CM

## 2023-11-16 DIAGNOSIS — E1169 Type 2 diabetes mellitus with other specified complication: Secondary | ICD-10-CM | POA: Diagnosis not present

## 2023-11-16 DIAGNOSIS — E039 Hypothyroidism, unspecified: Secondary | ICD-10-CM | POA: Diagnosis not present

## 2023-11-16 DIAGNOSIS — E785 Hyperlipidemia, unspecified: Secondary | ICD-10-CM | POA: Diagnosis not present

## 2023-11-16 DIAGNOSIS — I773 Arterial fibromuscular dysplasia: Secondary | ICD-10-CM | POA: Diagnosis not present

## 2023-11-16 DIAGNOSIS — Z7984 Long term (current) use of oral hypoglycemic drugs: Secondary | ICD-10-CM | POA: Diagnosis not present

## 2023-11-16 LAB — POCT GLYCOSYLATED HEMOGLOBIN (HGB A1C): Hemoglobin A1C: 8.2 % — AB (ref 4.0–5.6)

## 2023-11-16 LAB — GLUCOSE, CAPILLARY: Glucose-Capillary: 168 mg/dL — ABNORMAL HIGH (ref 70–99)

## 2023-11-16 MED ORDER — ASPIRIN 81 MG PO TBEC: 81.0000 mg | DELAYED_RELEASE_TABLET | Freq: Every day | ORAL | 3 refills | Status: AC

## 2023-11-16 MED ORDER — OLMESARTAN-AMLODIPINE-HCTZ 40-10-25 MG PO TABS: 1.0000 | ORAL_TABLET | Freq: Every day | ORAL | 1 refills | Status: AC

## 2023-11-16 MED ORDER — ROSUVASTATIN CALCIUM 20 MG PO TABS: 20.0000 mg | ORAL_TABLET | Freq: Every day | ORAL | 3 refills | Status: AC

## 2023-11-16 NOTE — Assessment & Plan Note (Signed)
-   She follows with Neurology for this.  - Continue aspirin 81mg  daily, crestor, and HTN mgmt

## 2023-11-16 NOTE — Assessment & Plan Note (Signed)
-   Patient's DBP's tend to run lower, which I want to be mindful of - She was hospitalized in 10/2023 with hypertensive urgency. I think this was in the setting of running out of Olmesartan-Amlodipine-HCTZ 40-10-25 daily and not realizing this, because she did not have the medicine at her hospital follow up  - Continue Olmesartan-Amlodipine-HCTZ 40-10-25 daily (sent in today) - Continue Metoprolol XL 100mg  daily - Continue Spironolactone 25mg  daily, which was started in the hospital - BMP today  - VBCI Pharmacy referral to call patient to ensure she has all blood pressure medicines

## 2023-11-16 NOTE — Assessment & Plan Note (Addendum)
-   chronic and stable - A1C 8.2 today, my goal is <8 - continue Metformin 1000mg  in AM and 500mg  in PM - attempted [facility] today, but patient unable to void in the cup, so will try again next time

## 2023-11-16 NOTE — Assessment & Plan Note (Signed)
-   chronic and stable - TSH normal in 10/2023 - continue levothyroxine daily

## 2023-11-16 NOTE — Patient Instructions (Signed)
 Thank you, Ms.[name] for allowing [place] to provide your care today. Today we discussed your blood pressure and your toe pain.    I have ordered the following labs for you:  Lab Orders         BMP8+Anion Gap         Lipid Profile         Glucose, capillary         Microalbumin / Creatinine Urine Ratio         POC Hbg A1C      Tests ordered today:  None  Referrals ordered today:   Referral Orders         AMB Referral VBCI Care Management       I have ordered the following medication/changed the following medications:   Stop the following medications: Medications Discontinued During This Encounter  Medication Reason   ASPIRIN LOW DOSE 81 MG tablet Reorder   Olmesartan-amLODIPine-HCTZ 40-10-25 MG TABS Reorder     Start the following medications: Meds ordered this encounter  Medications   aspirin EC (ASPIRIN LOW DOSE) 81 MG tablet    Sig: Take 1 tablet (81 mg total) by mouth daily. Swallow whole.    Dispense:  90 tablet    Refill:  3   Olmesartan-amLODIPine-HCTZ 40-10-25 MG TABS    Sig: Take 1 tablet by mouth daily.    Dispense:  90 tablet    Refill:  1     Return in about 3 months (around 02/15/2024).    Remember:  - You need to pick up your 3-in-1 blood pressure pill from the pharmacy. This medicine is called Olmesartan-amlodipine-hydrochlorothiazide and is ready at the pharmacy.  - I will have a pharmacist call you to review your medicines again and make sure you have everything you need - For your toe, you may take tylenol and use warm compresses. Do not use ibuprofen or advil   Should you have any questions or concerns please call the internal medicine clinic at [phone].     Mercie Eon, MD Faculty, Internal Medicine Teaching Progam [facility]

## 2023-11-16 NOTE — Assessment & Plan Note (Signed)
Continue Claritin daily.

## 2023-11-16 NOTE — Assessment & Plan Note (Signed)
-   I had decreased Rosuvastatin from 20 to 10mg  daily last time, but she is still taking 20mg  daily and muscle cramps have resolved - lipid panel today - continue rosuvastatin 20mg  daily

## 2023-11-16 NOTE — Assessment & Plan Note (Signed)
-   chronic and stable - Albuterol inhaler prn

## 2023-11-17 ENCOUNTER — Telehealth: Payer: Self-pay | Admitting: *Deleted

## 2023-11-17 LAB — LIPID PANEL
Chol/HDL Ratio: 2.4 ratio (ref 0.0–4.4)
Cholesterol, Total: 78 mg/dL — ABNORMAL LOW (ref 100–199)
HDL: 33 mg/dL — ABNORMAL LOW (ref >39)
LDL Chol Calc (NIH): 26 mg/dL (ref 0–99)
Triglycerides: 101 mg/dL (ref 0–149)
VLDL Cholesterol Cal: 19 mg/dL (ref 5–40)

## 2023-11-17 LAB — BMP8+ANION GAP
Anion Gap: 17 mmol/L (ref 10.0–18.0)
BUN/Creatinine Ratio: 13 (ref 12–28)
BUN: 17 mg/dL (ref 8–27)
CO2: 22 mmol/L (ref 20–29)
Calcium: 9.3 mg/dL (ref 8.7–10.3)
Chloride: 101 mmol/L (ref 96–106)
Creatinine, Ser: 1.3 mg/dL — ABNORMAL HIGH (ref 0.57–1.00)
Glucose: 169 mg/dL — ABNORMAL HIGH (ref 70–99)
Potassium: 4.2 mmol/L (ref 3.5–5.2)
Sodium: 140 mmol/L (ref 134–144)
eGFR: 43 mL/min/{1.73_m2} — ABNORMAL LOW (ref >59)

## 2023-11-17 NOTE — Progress Notes (Signed)
 Care Guide Pharmacy Note  11/17/2023 Name: NOVALEIGH KOHLMAN MRN: [id number] DOB: February 27, 1951  Referred By: Mercie Eon, MD Reason for referral: Care Coordination (Unsuccessful initial outreach to schedule referral with Pharmacist POD [address] [facility] )   Madison Jefferson is a 73 y.o. year old female who is a primary care patient of Mercie Eon, MD.  [name] was referred to the pharmacist for assistance related to:  med rec  review    An unsuccessful telephone outreach was attempted today to contact the patient who was referred to the pharmacy team for assistance with medication management. Additional attempts will be made to contact the patient.  Gwenevere Ghazi  [facility]  Value-Based Care Institute, [facility] Guide  Direct Dial: [phone]  Fax [phone]

## 2023-11-21 NOTE — Progress Notes (Unsigned)
 Care Guide Pharmacy Note  11/21/2023 Name: VENORA KAUTZMAN MRN: [id number] DOB: January 21, 1951  Referred By: Mercie Eon, MD Reason for referral: Care Coordination (Unsuccessful initial outreach to schedule referral with Pharmacist POD [facility] )   URIJAH RAYNOR is a 73 y.o. year old female who is a primary care patient of Mercie Eon, MD.  SHIRLEEN MCFAUL was referred to the pharmacist for assistance related to:  polypharmacy  A second unsuccessful telephone outreach was attempted today to contact the patient who was referred to the pharmacy team for assistance with medication management. Additional attempts will be made to contact the patient.  Gwenevere Ghazi  [facility]  Value-Based Care Institute, [facility] Guide  Direct Dial: [phone]  Fax [phone]

## 2023-11-24 ENCOUNTER — Other Ambulatory Visit: Payer: Self-pay | Admitting: Internal Medicine

## 2023-11-24 DIAGNOSIS — I10 Essential (primary) hypertension: Secondary | ICD-10-CM

## 2023-11-25 NOTE — Progress Notes (Signed)
 Care Guide Pharmacy Note  11/25/2023 Name: Madison Jefferson MRN: [id number] DOB: September 10, 1950  Referred By: Mercie Eon, MD Reason for referral: Care Coordination (Unsuccessful initial outreach to schedule referral with Pharmacist POD [facility] )   Madison Jefferson is a 73 y.o. year old female who is a primary care patient of Mercie Eon, MD.  Madison Jefferson was referred to the pharmacist for assistance related to:  Medication reconsolidation   A third unsuccessful telephone outreach was attempted today to contact the patient who was referred to the pharmacy team for assistance with medication management. The Population Health team is pleased to engage with this patient at any time in the future upon receipt of referral and should he/she be interested in assistance from the Population Health team.  Madison Jefferson  [facility]  Value-Based Care Institute, [facility] Guide  Direct Dial: [phone]  Fax [phone]\

## 2023-11-28 ENCOUNTER — Telehealth: Payer: Self-pay | Admitting: *Deleted

## 2023-11-28 NOTE — Telephone Encounter (Signed)
 I called pt who stated her doctor called her and wants her to come in for labs and BP check. Call transferred to front office - appt scheduled for tomorrow 4/15.

## 2023-11-29 ENCOUNTER — Ambulatory Visit: Admitting: *Deleted

## 2023-11-29 ENCOUNTER — Other Ambulatory Visit

## 2023-11-29 DIAGNOSIS — Z0130 Encounter for examination of blood pressure without abnormal findings: Secondary | ICD-10-CM

## 2023-11-29 DIAGNOSIS — I10 Essential (primary) hypertension: Secondary | ICD-10-CM

## 2023-11-29 NOTE — Progress Notes (Signed)
    Madison Jefferson presented today for blood pressure check. Patient is prescribed blood pressure medications and I confirmed that patient did take their blood pressure medication prior to today's appointment. Blood pressure was taken in the usual and appropriate manner using an automated BP cuff.     Vitals:   11/29/23 0938  BP: (!) 124/55      Results of today's visit will be routed to Dr. Jarvis Mesa for review and further management.

## 2023-11-30 LAB — BMP8+ANION GAP
Anion Gap: 17 mmol/L (ref 10.0–18.0)
BUN/Creatinine Ratio: 16 (ref 12–28)
BUN: 26 mg/dL (ref 8–27)
CO2: 23 mmol/L (ref 20–29)
Calcium: 9.1 mg/dL (ref 8.7–10.3)
Chloride: 99 mmol/L (ref 96–106)
Creatinine, Ser: 1.64 mg/dL — ABNORMAL HIGH (ref 0.57–1.00)
Glucose: 188 mg/dL — ABNORMAL HIGH (ref 70–99)
Potassium: 3.5 mmol/L (ref 3.5–5.2)
Sodium: 139 mmol/L (ref 134–144)
eGFR: 32 mL/min/{1.73_m2} — ABNORMAL LOW (ref >59)

## 2023-12-27 NOTE — Progress Notes (Unsigned)
 [facility]: Clinic Note  Subjective:  History of Present Illness: Madison Jefferson is a 73 y.o. year old female who presents for routine follow up of her hypertension and AKI.  She feels well today.  For context, she was hospitalized 10/2023 with hypertensive urgency in the setting of running out of Olmesartan -Amlodipine -HCTZ 40-10-25 daily. When she was discharged from the hospital, the team added Spironolactone  25mg  daily to her regimen.  When I saw her for follow up on 4/2, her blood pressure was 149/54, but her Cr was up to 1.64. I asked her to stop her Spironolactone , then come see me today.  She has been checking blood pressures at home, ranging 130s-140s/60s. No dizziness or lightheadedness. No chest pain, headaches, or shortness of breath.    Please refer to Assessment and Plan below for full details in Problem-Based Charting.   Past Medical History:  Patient Active Problem List   Diagnosis Date Noted   CKD (chronic kidney disease) stage 3, GFR 30-59 ml/min (HCC) 11/12/2023   Hypertensive urgency 11/11/2023   Left-sided fourth cranial nerve palsy 04/06/2023   Fibromuscular dysplasia of both carotid arteries (HCC) 12/29/2022   Healthcare maintenance 09/29/2022   Benign neoplasm of stomach 09/28/2022   HLD (hyperlipidemia) 02/02/2022   Gastric polyp    Right abducens nerve palsy 11/15/2019   Type 2 diabetes mellitus (HCC) 09/20/2013   Diverticulosis    Obesity, unspecified 10/12/2012   Allergic rhinitis 10/21/2009   Asthma 07/23/2009   Hypothyroidism 09/05/2008   Essential hypertension 09/25/2007      Medications:  Current Outpatient Medications:    albuterol  (VENTOLIN  HFA) 108 (90 Base) MCG/ACT inhaler, INHALE 1 TO 2 PUFFS INTO THE LUNGS EVERY 6 HOURS AS NEEDED FOR WHEEZING OR SHORTNESS OF BREATH, Disp: 20.1 g, Rfl: 1   aspirin  EC (ASPIRIN  LOW DOSE) 81 MG tablet, Take 1 tablet (81 mg total) by mouth daily. Swallow whole., Disp: 90 tablet, Rfl: 3    Cholecalciferol  (VITAMIN D -3 PO), Take 1 capsule by mouth daily., Disp: , Rfl:    glucose blood test strip, Check sugars daily., Disp: 100 each, Rfl: 3   ibuprofen  (ADVIL ) 200 MG tablet, Take 200 mg by mouth daily as needed for headache or moderate pain (pain score 4-6)., Disp: , Rfl:    levothyroxine  (SYNTHROID ) 75 MCG tablet, Take 1 tablet (75 mcg total) by mouth daily before breakfast., Disp: 90 tablet, Rfl: 3   loratadine  (CLARITIN ) 10 MG tablet, Take 1 tablet (10 mg total) by mouth daily., Disp: 90 tablet, Rfl: 3   metFORMIN  (GLUCOPHAGE -XR) 500 MG 24 hr tablet, Take 1 tablet (500 mg total) by mouth 2 (two) times daily with a meal., Disp: 180 tablet, Rfl: 3   metoprolol  succinate (TOPROL -XL) 100 MG 24 hr tablet, TAKE 1 TABLET BY MOUTH EVERY DAY WITH OR IMMEDIATELY FOLLOWING A MEAL, Disp: 90 tablet, Rfl: 1   Olmesartan -amLODIPine -HCTZ 40-10-25 MG TABS, Take 1 tablet by mouth daily., Disp: 90 tablet, Rfl: 1   polyethylene glycol (MIRALAX ) 17 g packet, Take 17 g by mouth daily as needed for moderate constipation., Disp: 14 each, Rfl: 0   rosuvastatin  (CRESTOR ) 20 MG tablet, Take 1 tablet (20 mg total) by mouth daily., Disp: 90 tablet, Rfl: 3   spironolactone  (ALDACTONE ) 25 MG tablet, Take 1 tablet (25 mg total) by mouth daily., Disp: 90 tablet, Rfl: 1   Allergies: Allergies  Allergen Reactions   Oysters [Shellfish Allergy] Rash       Objective:   Vitals:  Vitals:   12/28/23 0929  BP: 139/62  Pulse: 63  Temp: 98.3 F (36.8 C)  SpO2: 100%     Physical Exam: Physical Exam Constitutional:      Appearance: Normal appearance.  Cardiovascular:     Rate and Rhythm: Normal rate and regular rhythm.  Pulmonary:     Effort: Pulmonary effort is normal.     Breath sounds: Normal breath sounds.  Musculoskeletal:     Right lower leg: No edema.     Left lower leg: No edema.  Neurological:     Mental Status: She is alert.      Data: Labs, imaging, and micro were reviewed in Epic.  Refer to Assessment and Plan below for full details in Problem-Based Charting.  Assessment & Plan:  Essential hypertension - I am happy with the blood pressure of 130s-140s/60s in this 73yo patient. Her DBP's have historically run low so we need to be mindful of that - BMP today - Continue Olmesartan -Amlodipine -HCTZ 40-10-25 daily and Metoprolol  XL 100mg  daily - If AKI has improved, then we will keep this regimen. If not, I may need to pause the ARB & diuretic.  Type 2 diabetes mellitus (HCC) - chronic and stable - A1C 8.2 - We will check [facility] today, as we were unable to get this last time    Patient will follow up in 3 months   Driscilla George, MD

## 2023-12-28 ENCOUNTER — Encounter: Payer: Self-pay | Admitting: Internal Medicine

## 2023-12-28 ENCOUNTER — Ambulatory Visit (INDEPENDENT_AMBULATORY_CARE_PROVIDER_SITE_OTHER): Admitting: Internal Medicine

## 2023-12-28 VITALS — BP 139/62 | HR 63 | Temp 98.3°F | Ht 61.0 in | Wt 173.4 lb

## 2023-12-28 DIAGNOSIS — I10 Essential (primary) hypertension: Secondary | ICD-10-CM | POA: Diagnosis not present

## 2023-12-28 DIAGNOSIS — Z7984 Long term (current) use of oral hypoglycemic drugs: Secondary | ICD-10-CM

## 2023-12-28 DIAGNOSIS — E1169 Type 2 diabetes mellitus with other specified complication: Secondary | ICD-10-CM

## 2023-12-28 NOTE — Patient Instructions (Addendum)
 Thank you, Madison Jefferson for allowing us  to provide your care today. Today we discussed your blood pressure and your acute kidney injury.    I have ordered the following labs for you:   Lab Orders         BMP8+Anion Gap         Microalbumin / Creatinine Urine Ratio      Referrals ordered today:   Referral Orders  No referral(s) requested today     I have ordered the following medication/changed the following medications:   Stop the following medications: There are no discontinued medications.   Start the following medications: No orders of the defined types were placed in this encounter.    Return in about 3 months (around 03/29/2024) for Blood pressure, Diabetes.    Remember:  - Don't take the spironolactone  yet. I'll call you later this week to let you know if you should take this medicine - Our clinic is moving! Our new address is [address], [address] - [address]  Should you have any questions or concerns please call the internal medicine clinic at [phone].     Cornel Werber, MD Faculty, Internal Medicine Teaching Progam [facility]

## 2023-12-28 NOTE — Assessment & Plan Note (Signed)
-   I am happy with the blood pressure of 130s-140s/60s in this 73yo patient. Her DBP's have historically run low so we need to be mindful of that - BMP today - Continue Olmesartan -Amlodipine -HCTZ 40-10-25 daily and Metoprolol  XL 100mg  daily - If AKI has improved, then we will keep this regimen. If not, I may need to pause the ARB & diuretic.

## 2023-12-28 NOTE — Assessment & Plan Note (Signed)
-   chronic and stable - A1C 8.2 - We will check [facility] today, as we were unable to get this last time

## 2023-12-29 ENCOUNTER — Ambulatory Visit: Payer: Self-pay | Admitting: Internal Medicine

## 2023-12-29 LAB — BMP8+ANION GAP
Anion Gap: 15 mmol/L (ref 10.0–18.0)
BUN/Creatinine Ratio: 17 (ref 12–28)
BUN: 21 mg/dL (ref 8–27)
CO2: 24 mmol/L (ref 20–29)
Calcium: 9.2 mg/dL (ref 8.7–10.3)
Chloride: 97 mmol/L (ref 96–106)
Creatinine, Ser: 1.23 mg/dL — ABNORMAL HIGH (ref 0.57–1.00)
Glucose: 241 mg/dL — ABNORMAL HIGH (ref 70–99)
Potassium: 3.6 mmol/L (ref 3.5–5.2)
Sodium: 136 mmol/L (ref 134–144)
eGFR: 46 mL/min/{1.73_m2} — ABNORMAL LOW (ref >59)

## 2023-12-29 LAB — MICROALBUMIN / CREATININE URINE RATIO
Creatinine, Urine: 77.1 mg/dL
Microalb/Creat Ratio: 82 mg/g{creat} — ABNORMAL HIGH (ref 0–29)
Microalbumin, Urine: 63.3 ug/mL

## 2024-01-12 ENCOUNTER — Other Ambulatory Visit: Payer: Self-pay | Admitting: Internal Medicine

## 2024-01-12 DIAGNOSIS — Z1231 Encounter for screening mammogram for malignant neoplasm of breast: Secondary | ICD-10-CM

## 2024-01-30 ENCOUNTER — Telehealth: Payer: Self-pay | Admitting: *Deleted

## 2024-01-30 DIAGNOSIS — I10 Essential (primary) hypertension: Secondary | ICD-10-CM

## 2024-01-30 MED ORDER — METOPROLOL SUCCINATE ER 100 MG PO TB24: ORAL_TABLET | ORAL | 1 refills | Status: DC

## 2024-01-30 NOTE — Telephone Encounter (Signed)
 Medication sent to pharmacy

## 2024-01-30 NOTE — Telephone Encounter (Signed)
 Copied from CRM [id number]. Topic: Clinical - Prescription Issue >> Jan 30, 2024 10:43 AM Adrianna P wrote: Reason for CRM: Pharmacy states that there is no more refills left more metoprolol  succinate (TOPROL -XL) 100 MG 24 hr tablet, but is showing she has one refill left, and she only has two pills left

## 2024-02-08 ENCOUNTER — Other Ambulatory Visit: Payer: Self-pay

## 2024-02-08 DIAGNOSIS — E1169 Type 2 diabetes mellitus with other specified complication: Secondary | ICD-10-CM

## 2024-02-08 DIAGNOSIS — E039 Hypothyroidism, unspecified: Secondary | ICD-10-CM

## 2024-02-08 MED ORDER — METFORMIN HCL ER 500 MG PO TB24: 500.0000 mg | ORAL_TABLET | Freq: Two times a day (BID) | ORAL | 3 refills | Status: AC

## 2024-02-08 MED ORDER — LEVOTHYROXINE SODIUM 75 MCG PO TABS
75.0000 ug | ORAL_TABLET | Freq: Every day | ORAL | 3 refills | Status: AC
Start: 2024-02-08 — End: 2025-02-07

## 2024-02-21 ENCOUNTER — Ambulatory Visit

## 2024-02-21 NOTE — Addendum Note (Signed)
 Addended by: ANTONE DWAYNE SAILOR on: 02/21/2024 11:23 AM   Modules accepted: Orders

## 2024-02-22 ENCOUNTER — Ambulatory Visit
Admission: RE | Admit: 2024-02-22 | Discharge: 2024-02-22 | Disposition: A | Discharge status: Home / Self Care | Source: Ambulatory Visit | Attending: Internal Medicine | Admitting: Internal Medicine

## 2024-02-22 DIAGNOSIS — Z1231 Encounter for screening mammogram for malignant neoplasm of breast: Secondary | ICD-10-CM

## 2024-02-27 ENCOUNTER — Encounter: Payer: Self-pay | Admitting: *Deleted

## 2024-03-09 ENCOUNTER — Ambulatory Visit: Payer: Self-pay | Admitting: Student

## 2024-03-09 VITALS — BP 143/54 | HR 60 | Temp 97.9°F | Ht 61.0 in | Wt 171.6 lb

## 2024-03-09 DIAGNOSIS — E1169 Type 2 diabetes mellitus with other specified complication: Secondary | ICD-10-CM

## 2024-03-09 DIAGNOSIS — Z23 Encounter for immunization: Secondary | ICD-10-CM

## 2024-03-09 DIAGNOSIS — Z7984 Long term (current) use of oral hypoglycemic drugs: Secondary | ICD-10-CM | POA: Diagnosis not present

## 2024-03-09 DIAGNOSIS — E039 Hypothyroidism, unspecified: Secondary | ICD-10-CM

## 2024-03-09 DIAGNOSIS — E119 Type 2 diabetes mellitus without complications: Secondary | ICD-10-CM

## 2024-03-09 DIAGNOSIS — I10 Essential (primary) hypertension: Secondary | ICD-10-CM | POA: Diagnosis not present

## 2024-03-09 DIAGNOSIS — Z79890 Hormone replacement therapy (postmenopausal): Secondary | ICD-10-CM

## 2024-03-09 LAB — POCT GLYCOSYLATED HEMOGLOBIN (HGB A1C): Hemoglobin A1C: 9.8 % — AB (ref 4.0–5.6)

## 2024-03-09 LAB — GLUCOSE, CAPILLARY: Glucose-Capillary: 344 mg/dL — ABNORMAL HIGH (ref 70–99)

## 2024-03-09 MED ORDER — DAPAGLIFLOZIN PROPANEDIOL 5 MG PO TABS: 5.0000 mg | ORAL_TABLET | Freq: Every day | ORAL | 0 refills | Status: DC

## 2024-03-09 NOTE — Progress Notes (Signed)
 POC  Established Patient Office Visit  Subjective   Patient ID: Madison Jefferson, female    DOB: 02/17/1951  Age: 73 y.o. MRN: [id number]  Chief Complaint  Patient presents with   Knee Pain   Diabetes    Madison Jefferson is a 73 y.o. female with past medical history of hypertension, fibromuscular dysplasia of both carotid arteries, asthma, type 2 diabetes, hypothyroidism, left-sided 4th cranial nerve palsy, CKD stage III, hyperlipidemia presenting today for follow-up on diabetes and hypertension.  Last office visit on 12/28/2023.  Review of Systems:  As per assessment and Plan   Objective:     Vitals:   03/09/24 0941 03/09/24 1040  BP: (!) 142/59 (!) 143/54  Pulse: 64 60  Temp: 97.9 F (36.6 C)   TempSrc: Oral   SpO2: 99%   Weight: 171 lb 9.6 oz (77.8 kg)   Height: 5' 1 (1.549 m)     Physical Exam General: Sitting in chair, no acute distress Cardiovascular: Regular rate Pulmonary: Breathing comfortably Abdomen: Soft, nontender, nondistended MSK: No lower extremity edema bilaterally  Last metabolic panel Lab Results  Component Value Date   GLUCOSE 241 (H) 12/28/2023   NA 136 12/28/2023   K 3.6 12/28/2023   CL 97 12/28/2023   CO2 24 12/28/2023   BUN 21 12/28/2023   CREATININE 1.23 (H) 12/28/2023   EGFR 46 (L) 12/28/2023   CALCIUM  9.2 12/28/2023   PROT 5.9 (L) 11/12/2023   ALBUMIN 3.1 (L) 11/12/2023   LABGLOB 2.5 04/27/2022   AGRATIO 1.8 04/27/2022   BILITOT 2.5 (H) 11/12/2023   ALKPHOS 36 (L) 11/12/2023   AST 20 11/12/2023   ALT 22 11/12/2023   ANIONGAP 12 11/13/2023   Last lipids Lab Results  Component Value Date   CHOL 78 (L) 11/16/2023   HDL 33 (L) 11/16/2023   LDLCALC 26 11/16/2023   TRIG 101 11/16/2023   CHOLHDL 2.4 11/16/2023   Last hemoglobin A1c Lab Results  Component Value Date   HGBA1C 9.8 (A) 03/09/2024      The ASCVD Risk score (Arnett DK, et al., 2019) failed to calculate for the following reasons:   The valid total cholesterol range  is 130 to 320 mg/dL    Assessment & Plan:   Patient discussed with Dr. Jeanelle  Problem List Items Addressed This Visit       Cardiovascular and Mediastinum   Essential hypertension (Chronic)   BP Readings from Last 3 Encounters:  03/09/24 (!) 143/54  12/28/23 139/62  11/29/23 (!) 124/55   Patient is currently taking olmesartan -amlodipine -HCTZ 40-10-25 mg daily and metoprolol  XL 100 mg daily.  Last BMP checked on 12/28/2023 with improvement in creatinine, back to baseline. - Continue current regimen        Endocrine   Hypothyroidism (Chronic)   Continue Synthroid  75 mcg daily      Type 2 diabetes mellitus (HCC) - Primary (Chronic)   Last A1c 8.2 checked on 11/16/2023.  A1c today  9.8.  Goal A1c <8.  Patient reports that at her last visit, her physician told her to stop taking metformin  XR 500 mg twice daily.  Reports that she does not check her sugars at home either.  Her A1c is elevated today, patient is counseled on to restart metformin  XR 500 mg twice daily.  Patient's urine ACR was also elevated, has a history of CKD stage III.  She is currently on ARB, will benefit on extra renal protection with addition of SGLT2 inhibitor.  Per chart  review, she was previously on Jardiance  however stopped due to dizziness.  Patient does not recall. - Restart metformin  XR 500 mg twice daily - Start Farxiga 5 mg daily, can increase to 10 mg dose at the next office visit if patient is able to tolerate it. - Foot exam today, DP pulses intact - Follow-up with ophthalmologist next month - Will check an A1c in 3 months      Relevant Medications   dapagliflozin propanediol (FARXIGA) 5 MG TABS tablet   Other Relevant Orders   POC Hbg A1C (Completed)   Other Visit Diagnoses       Need for shingles vaccine           Return in about 3 months (around 06/09/2024) for A1c check, HTN.    Toma Edwards, DO

## 2024-03-09 NOTE — Patient Instructions (Signed)
 Thank you, [name] for allowing us  to provide your care today. Today we discussed:   For your Diabetes: - Please start metformin  XR 500 mg, take 1 tablet by mouth 2 times daily - Please also start Farxiga 5 mg, take 1 tab by mouth daily for kidney protection and diabetes  Please return to clinic in 3 months, so we can recheck your A1c  I have ordered the following labs for you:   Lab Orders         Glucose, capillary         POC Hbg A1C      Tests ordered today:  As above  Referrals ordered today:   Referral Orders  No referral(s) requested today     I have ordered the following medication/changed the following medications:   Stop the following medications: There are no discontinued medications.   Start the following medications: Meds ordered this encounter  Medications   dapagliflozin propanediol (FARXIGA) 5 MG TABS tablet    Sig: Take 1 tablet (5 mg total) by mouth daily before breakfast.    Dispense:  90 tablet    Refill:  0     Follow up: 3 months for diabetes   Remember:   Should you have any questions or concerns please call the internal medicine clinic at [phone].     Madison Jefferson, D.O. [facility]

## 2024-03-09 NOTE — Assessment & Plan Note (Signed)
 Continue Synthroid 75 mcg daily

## 2024-03-09 NOTE — Assessment & Plan Note (Signed)
 BP Readings from Last 3 Encounters:  03/09/24 (!) 143/54  12/28/23 139/62  11/29/23 (!) 124/55   Patient is currently taking olmesartan -amlodipine -HCTZ 40-10-25 mg daily and metoprolol  XL 100 mg daily.  Last BMP checked on 12/28/2023 with improvement in creatinine, back to baseline. - Continue current regimen

## 2024-03-09 NOTE — Assessment & Plan Note (Addendum)
 Last A1c 8.2 checked on 11/16/2023.  A1c today  9.8.  Goal A1c <8.  Patient reports that at her last visit, her physician told her to stop taking metformin  XR 500 mg twice daily.  Reports that she does not check her sugars at home either.  Her A1c is elevated today, patient is counseled on to restart metformin  XR 500 mg twice daily.  Patient's urine ACR was also elevated, has a history of CKD stage III.  She is currently on ARB, will benefit on extra renal protection with addition of SGLT2 inhibitor.  Per chart review, she was previously on Jardiance  however stopped due to dizziness.  Patient does not recall. - Restart metformin  XR 500 mg twice daily - Start Farxiga 5 mg daily, can increase to 10 mg dose at the next office visit if patient is able to tolerate it. - Foot exam today, DP pulses intact - Follow-up with ophthalmologist next month - Will check an A1c in 3 months

## 2024-03-14 ENCOUNTER — Ambulatory Visit

## 2024-03-14 VITALS — Ht 61.0 in | Wt 171.0 lb

## 2024-03-14 DIAGNOSIS — Z0000 Encounter for general adult medical examination without abnormal findings: Secondary | ICD-10-CM

## 2024-03-14 NOTE — Progress Notes (Signed)
 Because this visit was a virtual/telehealth visit,  certain criteria was not obtained, such a blood pressure, CBG if applicable, and timed get up and go. Any medications not marked as taking were not mentioned during the medication reconciliation part of the visit. Any vitals not documented were not able to be obtained due to this being a telehealth visit or patient was unable to self-report a recent blood pressure reading due to a lack of equipment at home via telehealth. Vitals that have been documented are verbally provided by the patient.   Subjective:   Madison Jefferson is a 73 y.o. who presents for a Medicare Wellness preventive visit.  As a reminder, Annual Wellness Visits don't include a physical exam, and some assessments may be limited, especially if this visit is performed virtually. We may recommend an in-person follow-up visit with your provider if needed.  Visit Complete: Virtual I connected with  Madison Jefferson on 03/14/24 by a audio enabled telemedicine application and verified that I am speaking with the correct person using two identifiers.  Patient Location: Home  Provider Location: Home Office  I discussed the limitations of evaluation and management by telemedicine. The patient expressed understanding and agreed to proceed.  Vital Signs: Because this visit was a virtual/telehealth visit, some criteria may be missing or patient reported. Any vitals not documented were not able to be obtained and vitals that have been documented are patient reported.  VideoDeclined- This patient declined Librarian, academic. Therefore the visit was completed with audio only.  Persons Participating in Visit: Patient.  AWV Questionnaire: No: Patient Medicare AWV questionnaire was not completed prior to this visit.  Cardiac Risk Factors include: advanced age (>90men, >101 women);hypertension;family history of premature cardiovascular disease;obesity (BMI  >30kg/m2);sedentary lifestyle     Objective:    Today's Vitals   03/14/24 1005  Weight: 171 lb (77.6 kg)  Height: 5' 1 (1.549 m)  PainSc: 0-No pain   Body mass index is 32.31 kg/m.     03/14/2024   10:07 AM 11/16/2023    9:17 AM 11/11/2023    6:39 PM 11/11/2023   12:47 PM 04/06/2023   10:03 AM 01/19/2023    6:45 PM 12/29/2022   10:12 AM  Advanced Directives  Does Patient Have a Medical Advance Directive? No No No No No No No  Would patient like information on creating a medical advance directive? No - Patient declined No - Patient declined No - Patient declined   Yes (MAU/Ambulatory/Procedural Areas - Information given) No - Patient declined    Current Medications (verified) Outpatient Encounter Medications as of 03/14/2024  Medication Sig   albuterol  (VENTOLIN  HFA) 108 (90 Base) MCG/ACT inhaler INHALE 1 TO 2 PUFFS INTO THE LUNGS EVERY 6 HOURS AS NEEDED FOR WHEEZING OR SHORTNESS OF BREATH   aspirin  EC (ASPIRIN  LOW DOSE) 81 MG tablet Take 1 tablet (81 mg total) by mouth daily. Swallow whole.   Cholecalciferol  (VITAMIN D -3 PO) Take 1 capsule by mouth daily.   dapagliflozin  propanediol (FARXIGA ) 5 MG TABS tablet Take 1 tablet (5 mg total) by mouth daily before breakfast.   glucose blood test strip Check sugars daily.   ibuprofen  (ADVIL ) 200 MG tablet Take 200 mg by mouth daily as needed for headache or moderate pain (pain score 4-6).   levothyroxine  (SYNTHROID ) 75 MCG tablet Take 1 tablet (75 mcg total) by mouth daily before breakfast.   loratadine  (CLARITIN ) 10 MG tablet Take 1 tablet (10 mg total) by  mouth daily.   metFORMIN  (GLUCOPHAGE -XR) 500 MG 24 hr tablet Take 1 tablet (500 mg total) by mouth 2 (two) times daily with a meal.   metoprolol  succinate (TOPROL -XL) 100 MG 24 hr tablet TAKE 1 TABLET BY MOUTH EVERY DAY WITH OR IMMEDIATELY FOLLOWING A MEAL   Olmesartan -amLODIPine -HCTZ 40-10-25 MG TABS Take 1 tablet by mouth daily.   polyethylene glycol (MIRALAX ) 17 g packet Take 17 g by  mouth daily as needed for moderate constipation.   rosuvastatin  (CRESTOR ) 20 MG tablet Take 1 tablet (20 mg total) by mouth daily.   No facility-administered encounter medications on file as of 03/14/2024.    Allergies (verified) Oysters [shellfish allergy]   History: Past Medical History:  Diagnosis Date   Allergy    Arthritis    Asthma    Atrial fibrillation (HCC)    h/o of isolated AF associated with hypothyroidism    Degenerative arthritis of thumb, left 06/01/2016   Diverticulosis    cecum   Diverticulosis    colonoscopy 2002 [facility]   Dysphagia 06/23/2021   Fatty liver    Fatty liver 10/29/2009   Qualifier: Diagnosis of   By: Lelon RIGGERS, Scott       GERD 10/21/2009   Qualifier: Diagnosis of   By: Lelon RIGGERS, Scott       GERD (gastroesophageal reflux disease)    Hypertension    Lumbar radiculopathy    Osteoarthritis of left glenohumeral joint 06/13/2016   Thrombocytopenia (HCC) 02/02/2022   Thyroid  disease    hypothyroid   TIA (transient ischemic attack)    2006   Urinary incontinence    mixed urge and stress    Vitamin D  deficiency    Vitamin D  deficiency 12/24/2009   Qualifier: Diagnosis of   By: Lelon RIGGERS [place]       Past Surgical History:  Procedure Laterality Date   ABLATION     thyroid  with radioactive iodine ablation   CARPAL TUNNEL RELEASE     left wrist   CHOLECYSTECTOMY     COLONOSCOPY WITH PROPOFOL  N/A 01/28/2022   Procedure: COLONOSCOPY WITH PROPOFOL ;  Surgeon: Rollin Dover, MD;  Location: [facility] ENDOSCOPY;  Service: Gastroenterology;  Laterality: N/A;   ESOPHAGOGASTRODUODENOSCOPY (EGD) WITH PROPOFOL  N/A 01/28/2022   Procedure: ESOPHAGOGASTRODUODENOSCOPY (EGD) WITH PROPOFOL ;  Surgeon: Rollin Dover, MD;  Location: [facility] ENDOSCOPY;  Service: Gastroenterology;  Laterality: N/A;   ESOPHAGOGASTRODUODENOSCOPY (EGD) WITH PROPOFOL  N/A 01/29/2022   Procedure: ESOPHAGOGASTRODUODENOSCOPY (EGD) WITH PROPOFOL ;  Surgeon: Rollin Dover, MD;  Location: [facility]  ENDOSCOPY;  Service: Gastroenterology;  Laterality: N/A;   HEMOSTASIS CLIP PLACEMENT  01/28/2022   Procedure: HEMOSTASIS CLIP PLACEMENT;  Surgeon: Rollin Dover, MD;  Location: [facility] ENDOSCOPY;  Service: Gastroenterology;;   HEMOSTASIS CLIP PLACEMENT  01/29/2022   Procedure: HEMOSTASIS CLIP PLACEMENT;  Surgeon: Rollin Dover, MD;  Location: [facility] ENDOSCOPY;  Service: Gastroenterology;;   HEMOSTASIS CONTROL  01/28/2022   Procedure: HEMOSTASIS CONTROL - EPI;  Surgeon: Rollin Dover, MD;  Location: [facility] ENDOSCOPY;  Service: Gastroenterology;;   LEFT HEART CATHETERIZATION WITH CORONARY ANGIOGRAM N/A 01/28/2012   Procedure: LEFT HEART CATHETERIZATION WITH CORONARY ANGIOGRAM;  Surgeon: Salena GORMAN Negri, MD;  Location: [facility] CATH LAB;  Service: Cardiovascular;  Laterality: N/A;   OTHER SURGICAL HISTORY     carpel tunnel surgery    OTHER SURGICAL HISTORY     carpel tunnel repair   POLYPECTOMY  01/28/2022   Procedure: POLYPECTOMY;  Surgeon: Rollin Dover, MD;  Location: [facility] ENDOSCOPY;  Service: Gastroenterology;;   POLYPECTOMY  01/29/2022  Procedure: POLYPECTOMY;  Surgeon: Rollin Dover, MD;  Location: [facility] ENDOSCOPY;  Service: Gastroenterology;;   RIGHT HEART CATH  2005-Dr. Claudene   minimal CAD; left circumflex dominant with 10-15% proximal lesion;; normal LVF   TUBAL LIGATION     VAGINAL HYSTERECTOMY     partial hysterectomy 1983 vaginal bleeding   Family History  Problem Relation Age of Onset   Diabetes Mother    Stroke Mother    Cancer Mother        mother <50 at diagnosis (breast); h/o oral cancer   Diabetes Father    Heart disease Father    Stroke Father    Hypertension Father    Diabetes Brother    Colon cancer Brother    Diabetes Sister    Diabetes Paternal Uncle    Cancer Brother        lung   Cancer Brother        throat   Esophageal cancer Neg Hx    Rectal cancer Neg Hx    Stomach cancer Neg Hx    Social History   Socioeconomic History   Marital status: Divorced    Spouse name: Not on  file   Number of children: Not on file   Years of education: Not on file   Highest education level: Not on file  Occupational History   Not on file  Tobacco Use   Smoking status: Never   Smokeless tobacco: Never  Vaping Use   Vaping status: Never Used  Substance and Sexual Activity   Alcohol use: No    Alcohol/week: 0.0 standard drinks of alcohol   Drug use: No   Sexual activity: Yes  Other Topics Concern   Not on file  Social History Narrative   Right handed   Lives alone in a two story   Social Drivers of Health   Financial Resource Strain: Low Risk  (03/14/2024)   Overall Financial Resource Strain (CARDIA)    Difficulty of Paying Living Expenses: Not hard at all  Food Insecurity: No Food Insecurity (03/14/2024)   Hunger Vital Sign    Worried About Running Out of Food in the Last Year: Never true    Ran Out of Food in the Last Year: Never true  Transportation Needs: No Transportation Needs (03/14/2024)   PRAPARE - Administrator, Civil Service (Medical): No    Lack of Transportation (Non-Medical): No  Physical Activity: Inactive (03/14/2024)   Exercise Vital Sign    Days of Exercise per Week: 0 days    Minutes of Exercise per Session: 0 min  Stress: No Stress Concern Present (03/14/2024)   [employer] of Occupational Health - Occupational Stress Questionnaire    Feeling of Stress: Not at all  Social Connections: Moderately Isolated (03/14/2024)   Social Connection and Isolation Panel    Frequency of Communication with Friends and Family: More than three times a week    Frequency of Social Gatherings with Friends and Family: Once a week    Attends Religious Services: More than 4 times per year    Active Member of [employer] or Organizations: No    Attends Engineer, structural: Never    Marital Status: Divorced    Tobacco Counseling Counseling given: Not Answered    Clinical Intake:  Pre-visit preparation completed: Yes  Pain : No/denies  pain Pain Score: 0-No pain     BMI - recorded: 32.31 Nutritional Status: BMI > 30  Obese Nutritional Risks: None Diabetes: No  Lab Results  Component Value Date   HGBA1C 9.8 (A) 03/09/2024   HGBA1C 8.2 (A) 11/16/2023   HGBA1C 6.1 (A) 12/29/2022     How often do you need to have someone help you when you read instructions, pamphlets, or other written materials from your doctor or pharmacy?: 1 - Never  Interpreter Needed?: No  Information entered by :: Ingeborg Fite N. Arnecia Ector, LPN.   Activities of Daily Living     03/14/2024   10:09 AM 11/16/2023    9:16 AM  In your present state of health, do you have any difficulty performing the following activities:  Hearing? 0 0  Vision? 0 0  Difficulty concentrating or making decisions? 0 0  Walking or climbing stairs? 0 0  Dressing or bathing? 0 0  Doing errands, shopping? 0 0  Preparing Food and eating ? N   Using the Toilet? N   In the past six months, have you accidently leaked urine? N   Do you have problems with loss of bowel control? N   Managing your Medications? N   Managing your Finances? N   Housekeeping or managing your Housekeeping? N     Patient Care Team: Shawn Sick, MD as PCP - General Onesimo Claude, MD (Internal Medicine) [facility] Skeet Juliene SAUNDERS, DO as Consulting Physician (Neurology) Octavia Bruckner, MD as Consulting Physician (Ophthalmology)  I have updated your Care Teams any recent Medical Services you may have received from other providers in the past year.     Assessment:   This is a routine wellness examination for Madison Jefferson.  Hearing/Vision screen Hearing Screening - Comments:: Denies hearing difficulties.   Vision Screening - Comments:: Wears rx glasses - up to date with routine eye exams with [facility]    Goals Addressed             This Visit's Progress    03/14/24: To stay healthy and get back to exercising.         Depression Screen     03/14/2024    10:10 AM 11/16/2023    9:16 AM 04/06/2023   10:58 AM 04/06/2023   10:57 AM 01/19/2023    6:44 PM 09/29/2022   10:32 AM 04/27/2022   11:17 AM  PHQ 2/9 Scores  PHQ - 2 Score 0 0 0 0 0 0 0  PHQ- 9 Score 0          Fall Risk     03/14/2024   10:08 AM 11/16/2023    9:15 AM 04/06/2023   10:00 AM 01/19/2023    6:45 PM 12/29/2022   10:11 AM  Fall Risk   Falls in the past year? 0 0 1 0 0  Number falls in past yr: 0 0 0 0 0  Injury with Fall? 0 0 0 0 0  Risk for fall due to : No Fall Risks No Fall Risks  No Fall Risks   Follow up Falls evaluation completed Falls prevention discussed;Falls evaluation completed Falls evaluation completed Falls prevention discussed;Education provided;Falls evaluation completed Falls evaluation completed    MEDICARE RISK AT HOME:  Medicare Risk at Home Any stairs in or around the home?: Yes If so, are there any without handrails?: No Home free of loose throw rugs in walkways, pet beds, electrical cords, etc?: Yes Adequate lighting in your home to reduce risk of falls?: Yes Life alert?: No Use of a cane, walker or w/c?: No Grab bars in the bathroom?: Yes Shower chair or  bench in shower?: Yes Elevated toilet seat or a handicapped toilet?: No  TIMED UP AND GO:  Was the test performed?  No  Cognitive Function: 6CIT completed    03/14/2024   10:10 AM  MMSE - Mini Mental State Exam  Not completed: Unable to complete        03/14/2024   10:15 AM 01/19/2023    6:45 PM 11/06/2021    2:08 PM  6CIT Screen  What Year? 0 points 0 points 0 points  What month? 0 points 0 points 0 points  What time? 0 points 0 points 0 points  Count back from 20 0 points 0 points 0 points  Months in reverse 0 points 0 points 0 points  Repeat phrase 0 points 0 points 0 points  Total Score 0 points 0 points 0 points    Immunizations Immunization History  Administered Date(s) Administered   Fluad Quad(high Dose 65+) 05/16/2021, 04/27/2022   H1N1 09/04/2008   Influenza Split  07/03/2012   Influenza Whole 07/08/2009   Influenza, High Dose Seasonal PF 06/24/2017, 06/01/2019   Influenza,inj,Quad PF,6+ Mos 09/21/2013, 06/20/2014, 04/18/2018   Influenza-Unspecified 05/09/2023   PFIZER(Purple Top)SARS-COV-2 Vaccination 10/14/2019, 11/07/2019, 06/01/2020, 07/02/2022   Pfizer(Comirnaty)Fall Seasonal Vaccine 12 years and older 05/02/2023   Pneumococcal Conjugate-13 03/18/2015   Pneumococcal Polysaccharide-23 12/12/2013   Respiratory Syncytial Virus Vaccine,Recomb Aduvanted(Arexvy) 10/12/2022   Tdap 12/10/2015   Zoster Recombinant(Shingrix) 04/19/2023, 06/22/2023    Screening Tests Health Maintenance  Topic Date Due   OPHTHALMOLOGY EXAM  03/03/2023   COVID-19 Vaccine (6 - 2024-25 season) 10/30/2023   INFLUENZA VACCINE  03/16/2024   HEMOGLOBIN A1C  09/09/2024   Diabetic kidney evaluation - eGFR measurement  12/27/2024   Diabetic kidney evaluation - Urine ACR  12/27/2024   FOOT EXAM  03/09/2025   Medicare Annual Wellness (AWV)  03/14/2025   DTaP/Tdap/Td (2 - Td or Tdap) 12/09/2025   Colonoscopy  03/18/2032   Pneumococcal Vaccine: 50+ Years  Completed   DEXA SCAN  Completed   Hepatitis C Screening  Completed   Zoster Vaccines- Shingrix  Completed   Hepatitis B Vaccines  Aged Out   HPV VACCINES  Aged Out   Meningococcal B Vaccine  Aged Out    Health Maintenance  Health Maintenance Due  Topic Date Due   OPHTHALMOLOGY EXAM  03/03/2023   COVID-19 Vaccine (6 - 2024-25 season) 10/30/2023   Health Maintenance Items Addressed: Yes Patient aware of current care gaps.  Immunization record was verified by [place] [facility] and immunization record updated.  Additional Screening:  Vision Screening: Recommended annual ophthalmology exams for early detection of glaucoma and other disorders of the eye. Would you like a referral to an eye doctor? No    Dental Screening: Recommended annual dental exams for proper oral hygiene  Community Resource Referral / Chronic Care  Management: CRR required this visit?  No   CCM required this visit?  No   Plan:    I have personally reviewed and noted the following in the patient's chart:   Medical and social history Use of alcohol, tobacco or illicit drugs  Current medications and supplements including opioid prescriptions. Patient is not currently taking opioid prescriptions. Functional ability and status Nutritional status Physical activity Advanced directives List of other physicians Hospitalizations, surgeries, and ER visits in previous 12 months Vitals Screenings to include cognitive, depression, and falls Referrals and appointments  In addition, I have reviewed and discussed with patient certain preventive protocols, quality metrics, and best practice recommendations. A  written personalized care plan for preventive services as well as general preventive health recommendations were provided to patient.   Madison LOISE Fuller, LPN   [id number]   After Visit Summary: (Declined) Due to this being a telephonic visit, with patients personalized plan was offered to patient but patient Declined AVS at this time   Notes: Patient aware of current care gaps.  Immunization record was verified by [place] [facility] and immunization record updated. Patient is scheduled for eye exam with [facility]  2025.

## 2024-03-14 NOTE — Patient Instructions (Signed)
 Madison Jefferson , Thank you for taking time out of your busy schedule to complete your Annual Wellness Visit with me. I enjoyed our conversation and look forward to speaking with you again next year. I, as well as your care team,  appreciate your ongoing commitment to your health goals. Please review the following plan we discussed and let me know if I can assist you in the future. Your Game plan/ To Do List    Referrals: If you haven't heard from the office you've been referred to, please reach out to them at the phone provided.   Follow up Visits: Next Medicare AWV with our clinical staff: 03/20/2024 at 10:30 a.m. phone visit with Nurse   Have you seen your provider in the last 6 months (3 months if uncontrolled diabetes)? Yes Next Office Visit with your provider: Patient will call to schedule  Clinician Recommendations:  Aim for 30 minutes of exercise or brisk walking, 6-8 glasses of water, and 5 servings of fruits and vegetables each day.       This is a list of the screening recommended for you and due dates:  Health Maintenance  Topic Date Due   Eye exam for diabetics  03/03/2023   COVID-19 Vaccine (6 - 2024-25 season) 10/30/2023   Flu Shot  03/16/2024   Hemoglobin A1C  09/09/2024   Yearly kidney function blood test for diabetes  12/27/2024   Yearly kidney health urinalysis for diabetes  12/27/2024   Complete foot exam   03/09/2025   Medicare Annual Wellness Visit  03/14/2025   DTaP/Tdap/Td vaccine (2 - Td or Tdap) 12/09/2025   Colon Cancer Screening  03/18/2032   Pneumococcal Vaccine for age over 92  Completed   DEXA scan (bone density measurement)  Completed   Hepatitis C Screening  Completed   Zoster (Shingles) Vaccine  Completed   Hepatitis B Vaccine  Aged Out   HPV Vaccine  Aged Out   Meningitis B Vaccine  Aged Out    Advanced directives: (Declined) Advance directive discussed with you today. Even though you declined this today, please call our office should you change your  mind, and we can give you the proper paperwork for you to fill out. Advance Care Planning is important because it:  [x]  Makes sure you receive the medical care that is consistent with your values, goals, and preferences  [x]  It provides guidance to your family and loved ones and reduces their decisional burden about whether or not they are making the right decisions based on your wishes.  Follow the link provided in your after visit summary or read over the paperwork we have mailed to you to help you started getting your Advance Directives in place. If you need assistance in completing these, please reach out to us  so that we can help you!  See attachments for Preventive Care and Fall Prevention Tips.

## 2024-03-16 NOTE — Progress Notes (Signed)
 Internal Medicine Attending:  I reviewed the AWV findings of the medical professional who conducted the visit. I was present in the office suite and immediately available to provide assistance and direction throughout the time the service was provided.

## 2024-03-16 NOTE — Progress Notes (Signed)
 I reviewed the AWV findings of the licensed medical professional who conducted the visit. I was present in the office suite and immediately available to provide assistance and direction throughout the time the service was provided.

## 2024-03-21 DIAGNOSIS — H04123 Dry eye syndrome of bilateral lacrimal glands: Secondary | ICD-10-CM | POA: Diagnosis not present

## 2024-03-21 DIAGNOSIS — H43811 Vitreous degeneration, right eye: Secondary | ICD-10-CM | POA: Diagnosis not present

## 2024-03-21 DIAGNOSIS — H0288A Meibomian gland dysfunction right eye, upper and lower eyelids: Secondary | ICD-10-CM | POA: Diagnosis not present

## 2024-03-21 DIAGNOSIS — Z961 Presence of intraocular lens: Secondary | ICD-10-CM | POA: Diagnosis not present

## 2024-03-21 DIAGNOSIS — H0288B Meibomian gland dysfunction left eye, upper and lower eyelids: Secondary | ICD-10-CM | POA: Diagnosis not present

## 2024-04-10 ENCOUNTER — Telehealth: Payer: Self-pay | Admitting: *Deleted

## 2024-04-10 NOTE — Telephone Encounter (Signed)
 Patient was identified as falling into the [place] Measure - Diabetes.   Patient was: Appointment already scheduled for:  06/20/24.

## 2024-04-15 NOTE — Progress Notes (Signed)
 Internal Medicine Clinic Attending  Case discussed with the resident at the time of the visit.  We reviewed the resident's history and exam and pertinent patient test results.  I agree with the assessment, diagnosis, and plan of care documented in the resident's note.

## 2024-05-22 ENCOUNTER — Telehealth: Payer: Self-pay

## 2024-05-22 NOTE — Progress Notes (Signed)
 Pharmacy Quality Measure Review  This patient is appearing on a report for being at risk of failing the adherence measure for diabetes medications this calendar year.   Medication: Metformin  ER 500mg  Last fill date: 06/25 for 90 day supply  Reviewed medication indication, dosing, and goals of therapy.  Patient states that she has a lot of supply, and that her last doctor informed her that she doesn't have to take it every day.   Advised patient to take it as prescribed (twice daily) and use remaining supply. Advised her to confirm consistent, daily administration with primary care doctor at appointment scheduled next month. All patient questions were answered.  Angela Baalmann, PharmD [facility] Pharmacist

## 2024-05-31 ENCOUNTER — Telehealth: Payer: Self-pay

## 2024-05-31 DIAGNOSIS — E1169 Type 2 diabetes mellitus with other specified complication: Secondary | ICD-10-CM

## 2024-05-31 NOTE — Progress Notes (Signed)
 This patient is appearing on a report for being at risk of failing the adherence measure for diabetes medications this calendar year.   Medication: Farxiga  5 mg daily Last fill date: 03/11/24 for 90 day supply  Patient was initiated on Farxiga  by Dr. Heddy on 03/09/24, as her A1C increased to 9.8%. She had previously taken Jardiance  but stopped due to dizziness. Therefore, she was instructed to retrial low-dose Farxiga  5 mg daily. She has an upcoming PCP appt on 06/18/24 with Dr. Shawn, however she will run out of refills before this date. Attempted to call patient to confirm tolerability of Farxiga  and adherence to other medications. Call was completed, however I was unable to speak with patient as I could only hear music in the background. Attempted several times. Will outreach Dr. Shawn to see if she is comfortable refill her Farxiga  prior to upcoming appointment.   Lorain Baseman, PharmD [facility] [phone]

## 2024-06-03 MED ORDER — DAPAGLIFLOZIN PROPANEDIOL 5 MG PO TABS: 5.0000 mg | ORAL_TABLET | Freq: Every day | ORAL | 0 refills | Status: DC

## 2024-06-20 ENCOUNTER — Ambulatory Visit (INDEPENDENT_AMBULATORY_CARE_PROVIDER_SITE_OTHER)

## 2024-06-20 VITALS — BP 134/53 | HR 59 | Temp 97.8°F | Ht 61.0 in | Wt 172.6 lb

## 2024-06-20 DIAGNOSIS — N1831 Chronic kidney disease, stage 3a: Secondary | ICD-10-CM

## 2024-06-20 DIAGNOSIS — E1122 Type 2 diabetes mellitus with diabetic chronic kidney disease: Secondary | ICD-10-CM | POA: Diagnosis not present

## 2024-06-20 DIAGNOSIS — I129 Hypertensive chronic kidney disease with stage 1 through stage 4 chronic kidney disease, or unspecified chronic kidney disease: Secondary | ICD-10-CM | POA: Diagnosis not present

## 2024-06-20 DIAGNOSIS — Z79899 Other long term (current) drug therapy: Secondary | ICD-10-CM

## 2024-06-20 DIAGNOSIS — Z8249 Family history of ischemic heart disease and other diseases of the circulatory system: Secondary | ICD-10-CM

## 2024-06-20 DIAGNOSIS — E1169 Type 2 diabetes mellitus with other specified complication: Secondary | ICD-10-CM

## 2024-06-20 DIAGNOSIS — K5900 Constipation, unspecified: Secondary | ICD-10-CM

## 2024-06-20 DIAGNOSIS — Z7984 Long term (current) use of oral hypoglycemic drugs: Secondary | ICD-10-CM

## 2024-06-20 DIAGNOSIS — Z23 Encounter for immunization: Secondary | ICD-10-CM

## 2024-06-20 DIAGNOSIS — I10 Essential (primary) hypertension: Secondary | ICD-10-CM

## 2024-06-20 DIAGNOSIS — Z7985 Long-term (current) use of injectable non-insulin antidiabetic drugs: Secondary | ICD-10-CM

## 2024-06-20 DIAGNOSIS — J4520 Mild intermittent asthma, uncomplicated: Secondary | ICD-10-CM

## 2024-06-20 DIAGNOSIS — Z833 Family history of diabetes mellitus: Secondary | ICD-10-CM

## 2024-06-20 LAB — GLUCOSE, CAPILLARY: Glucose-Capillary: 238 mg/dL — ABNORMAL HIGH (ref 70–99)

## 2024-06-20 LAB — POCT GLYCOSYLATED HEMOGLOBIN (HGB A1C): HbA1c, POC (controlled diabetic range): 7.4 % — AB (ref 0.0–7.0)

## 2024-06-20 MED ORDER — COVID-19 MRNA VAC-TRIS(PFIZER) 30 MCG/0.3ML IM SUSY: 0.3000 mL | PREFILLED_SYRINGE | Freq: Once | INTRAMUSCULAR | 0 refills | Status: AC

## 2024-06-20 MED ORDER — SENNA 8.6 MG PO TABS: 2.0000 | ORAL_TABLET | Freq: Every day | ORAL | 0 refills | Status: AC

## 2024-06-20 NOTE — Assessment & Plan Note (Addendum)
 BP well controlled today.  - Continue Olmesartan -Amlodipine -HCTZ 40-10-25 daily and Metoprolol  XL 100mg  daily

## 2024-06-20 NOTE — Assessment & Plan Note (Signed)
 UMACR elevated at 82 12/2023, Cr bl ~1-1.2. She is now on Farxiga , ARB.  - cont SGLT2i, ARB. Repeat BMP ~6 mos.

## 2024-06-20 NOTE — Progress Notes (Addendum)
 Subjective:   [name]   DOB: July 19, 1951 73 y.o.   MRN: [id number]  HPI: [name] is a 74F PMH HTN, T2DM, hypothyroidism, mild intermittent asthma who presents for follow up of HTN, T2DM.  T2DM - last visit, found to have elevated A1c to 9.8 and she was subsequently started on Farxiga  5 mg and restarted on metformin  500 mg BID. She has been tolerating these medications well without complication. She does not check her blood sugars at home. Today, A1c down to 7.4.  Abdominal bloating/constipation - has been feeling bloated/constipated recently, can occasionally go >1 week without having a BM and has to strain to have a BM. Does not drink a lot of water at home.   HTN - 134/53 today, taking her home antihypertensives without issue. She does not regularly check her BP at home.   Asthma - triggered by seasonal allergies in fall/spring, uses albuterol  PRN and has been using it <1/week.    Past Medical History:  Diagnosis Date   Allergy    Arthritis    Asthma    Atrial fibrillation (HCC)    h/o of isolated AF associated with hypothyroidism    Degenerative arthritis of thumb, left 06/01/2016   Diverticulosis    cecum   Diverticulosis    colonoscopy 2002 [facility]   Dysphagia 06/23/2021   Fatty liver    Fatty liver 10/29/2009   Qualifier: Diagnosis of   By: Lelon RIGGERS, Scott       GERD 10/21/2009   Qualifier: Diagnosis of   By: Lelon RIGGERS, Scott       GERD (gastroesophageal reflux disease)    Hypertension    Lumbar radiculopathy    Osteoarthritis of left glenohumeral joint 06/13/2016   Thrombocytopenia 02/02/2022   Thyroid  disease    hypothyroid   TIA (transient ischemic attack)    2006   Urinary incontinence    mixed urge and stress    Vitamin D  deficiency    Vitamin D  deficiency 12/24/2009   Qualifier: Diagnosis of   By: Lelon RIGGERS [place]       Current Outpatient Medications  Medication Sig Dispense Refill   albuterol  (VENTOLIN  HFA) 108  (90 Base) MCG/ACT inhaler INHALE 1 TO 2 PUFFS INTO THE LUNGS EVERY 6 HOURS AS NEEDED FOR WHEEZING OR SHORTNESS OF BREATH 20.1 g 1   aspirin  EC (ASPIRIN  LOW DOSE) 81 MG tablet Take 1 tablet (81 mg total) by mouth daily. Swallow whole. 90 tablet 3   Cholecalciferol  (VITAMIN D -3 PO) Take 1 capsule by mouth daily.     dapagliflozin  propanediol (FARXIGA ) 5 MG TABS tablet Take 1 tablet (5 mg total) by mouth daily before breakfast. 90 tablet 0   glucose blood test strip Check sugars daily. 100 each 3   ibuprofen  (ADVIL ) 200 MG tablet Take 200 mg by mouth daily as needed for headache or moderate pain (pain score 4-6).     levothyroxine  (SYNTHROID ) 75 MCG tablet Take 1 tablet (75 mcg total) by mouth daily before breakfast. 90 tablet 3   loratadine  (CLARITIN ) 10 MG tablet Take 1 tablet (10 mg total) by mouth daily. 90 tablet 3   metFORMIN  (GLUCOPHAGE -XR) 500 MG 24 hr tablet Take 1 tablet (500 mg total) by mouth 2 (two) times daily with a meal. 180 tablet 3   metoprolol  succinate (TOPROL -XL) 100 MG 24 hr tablet TAKE 1 TABLET BY MOUTH EVERY DAY WITH OR IMMEDIATELY FOLLOWING A MEAL 90 tablet 1   Olmesartan -amLODIPine -HCTZ  40-10-25 MG TABS Take 1 tablet by mouth daily. 90 tablet 1   polyethylene glycol (MIRALAX ) 17 g packet Take 17 g by mouth daily as needed for moderate constipation. 14 each 0   rosuvastatin  (CRESTOR ) 20 MG tablet Take 1 tablet (20 mg total) by mouth daily. 90 tablet 3   No current facility-administered medications for this visit.   Family History  Problem Relation Age of Onset   Diabetes Mother    Stroke Mother    Cancer Mother        mother <50 at diagnosis (breast); h/o oral cancer   Diabetes Father    Heart disease Father    Stroke Father    Hypertension Father    Diabetes Brother    Colon cancer Brother    Diabetes Sister    Diabetes Paternal Uncle    Cancer Brother        lung   Cancer Brother        throat   Esophageal cancer Neg Hx    Rectal cancer Neg Hx    Stomach  cancer Neg Hx    Social History   Socioeconomic History   Marital status: Divorced    Spouse name: Not on file   Number of children: Not on file   Years of education: Not on file   Highest education level: Not on file  Occupational History   Not on file  Tobacco Use   Smoking status: Never   Smokeless tobacco: Never  Vaping Use   Vaping status: Never Used  Substance and Sexual Activity   Alcohol use: No    Alcohol/week: 0.0 standard drinks of alcohol   Drug use: No   Sexual activity: Yes  Other Topics Concern   Not on file  Social History Narrative   Right handed   Lives alone in a two story   Social Drivers of Health   Financial Resource Strain: Low Risk  (03/14/2024)   Overall Financial Resource Strain (CARDIA)    Difficulty of Paying Living Expenses: Not hard at all  Food Insecurity: No Food Insecurity (03/14/2024)   Hunger Vital Sign    Worried About Running Out of Food in the Last Year: Never true    Ran Out of Food in the Last Year: Never true  Transportation Needs: No Transportation Needs (03/14/2024)   PRAPARE - Administrator, Civil Service (Medical): No    Lack of Transportation (Non-Medical): No  Physical Activity: Inactive (03/14/2024)   Exercise Vital Sign    Days of Exercise per Week: 0 days    Minutes of Exercise per Session: 0 min  Stress: No Stress Concern Present (03/14/2024)   [employer] of Occupational Health - Occupational Stress Questionnaire    Feeling of Stress: Not at all  Social Connections: Moderately Isolated (03/14/2024)   Social Connection and Isolation Panel    Frequency of Communication with Friends and Family: More than three times a week    Frequency of Social Gatherings with Friends and Family: Once a week    Attends Religious Services: More than 4 times per year    Active Member of [employer] or Organizations: No    Attends Banker Meetings: Never    Marital Status: Divorced   Review of  Systems: Pertinent items are noted in HPI. Objective:   Vitals:   06/20/24 0945  TempSrc: Oral  Height: 5' 1 (1.549 m)    Physical Exam: GEN: Well appearing, NAD. Oriented x 3, normal  mood and affect  HEENT: Normocephalic, atraumatic, Conjunctiva clear, sclera non-icteric CV: RRR, no M/R/G PULM: CTAB MSK: b/l LE wnl NEURO: moving all extremities spontaneously in upper and lower extremities. PSYCH: Oriented X3, intact recent and remote memory, judgment and insight, normal mood and affect.   Assessment & Plan:   Assessment & Plan Type 2 diabetes mellitus with other specified complication, without long-term current use of insulin  (HCC) A1c 7.4 today. Continue Farxiga  5 mg, metformin  500 mg BID. She does note difficulty adhering to a diabetic diet, will refer to Nutrition.  Essential hypertension BP well controlled today.  - Continue Olmesartan -Amlodipine -HCTZ 40-10-25 daily and Metoprolol  XL 100mg  daily Stage 3a chronic kidney disease (HCC) UMACR elevated at 82 12/2023, Cr bl ~1-1.2. She is now on Farxiga , ARB.  - cont SGLT2i, ARB. Repeat BMP ~6 mos. Encounter for immunization Flu vaccine today Mild intermittent asthma in adult without complication Currently only requiring rescue inhaler <1x/week.  - Continue albuterol  PRN.  Constipation, unspecified constipation type Senna 2 tablets BID ordered. Encouraged hydration, high fiber diet.  Jone Dauphin MD

## 2024-06-20 NOTE — Assessment & Plan Note (Signed)
 A1c 7.4 today. Continue Farxiga  5 mg, metformin  500 mg BID. She does note difficulty adhering to a diabetic diet, will refer to Nutrition.

## 2024-06-20 NOTE — Patient Instructions (Signed)
 Thank you for coming in today. If you have any questions or concerns, please feel free to contact me via MyChart or call the office.   If you have gotten labs today, I will be in touch via MyChart or telephone.   Have a great day.

## 2024-06-26 ENCOUNTER — Telehealth: Payer: Self-pay | Admitting: *Deleted

## 2024-06-26 ENCOUNTER — Ambulatory Visit (INDEPENDENT_AMBULATORY_CARE_PROVIDER_SITE_OTHER)

## 2024-06-26 ENCOUNTER — Other Ambulatory Visit: Payer: Self-pay

## 2024-06-26 ENCOUNTER — Ambulatory Visit (HOSPITAL_COMMUNITY)
Admission: RE | Admit: 2024-06-26 | Discharge: 2024-06-26 | Disposition: A | Discharge status: Home / Self Care | Source: Ambulatory Visit

## 2024-06-26 ENCOUNTER — Ambulatory Visit: Admitting: Dietician

## 2024-06-26 VITALS — BP 137/83 | HR 59 | Temp 97.8°F | Ht 60.0 in | Wt 170.0 lb

## 2024-06-26 DIAGNOSIS — E119 Type 2 diabetes mellitus without complications: Secondary | ICD-10-CM

## 2024-06-26 DIAGNOSIS — M79671 Pain in right foot: Secondary | ICD-10-CM | POA: Diagnosis not present

## 2024-06-26 NOTE — Telephone Encounter (Signed)
 Will forward to Dr. Tan who has an appointment with  patient this morning. Copied from CRM [id number]. Topic: Appointments - Scheduling Inquiry for Clinic >> Jun 26, 2024  9:12 AM Miquel SAILOR wrote: Reason for CRM: Pt having RT foot issue has app with Dr. Camie for dietician today at 10:15 am but would like to be seen tor her foot also. Called office and transferred to [place]

## 2024-06-26 NOTE — Progress Notes (Signed)
 Diabetes Self-Management Education  Visit Type: Annual Follow-Up (patient was seen when newly diagnosed but had no follow up after that. last visit was 07/21/2021)  Appt. Start Time: 1055 Appt. End Time: 1125  06/26/2024  [name], identified by name and date of birth, is a 73 y.o. female with a diagnosis of Diabetes: Type 2.   ASSESSMENT  There were no vitals taken for this visit. There is no height or weight on file to calculate BMI.   Diabetes Self-Management Education - 06/26/24 1100       Visit Information   Visit Type Annual Follow-Up   patient was seen when newly diagnosed but had no follow up after that. last visit was 07/21/2021     Initial Visit   Diabetes Type Type 2    Date Diagnosed 07/2021    Are you currently following a meal plan? Yes    What type of meal plan do you follow? less bread and starches, few sweets, more fruits instead, bakes foods, more vegetables   has lost weight and would like to contiue this effort   Are you taking your medications as prescribed? Yes   was no aware that Farxiga  was for her blood sugars     Health Coping   How would you rate your overall health? Fair   here with foot hurting     Psychosocial Assessment   Patient Belief/Attitude about Diabetes Motivated to manage diabetes    What is the hardest part about your diabetes right now, causing you the most concern, or is the most worrisome to you about your diabetes?   Other (comment)   amputations and dialysis scare her   Self-care barriers Lack of material resources    Self-management support Doctor's office;CDE visits    Patient Concerns Healthy Lifestyle    Special Needs None    Preferred Learning Style No preference indicated    Learning Readiness Contemplating   she is not  setting goals with us , but obviously is making changes to aachieve weight loss and improved diabetes control   How often do you need to have someone help you when you read instructions, pamphlets, or other  written materials from your doctor or pharmacy? 2 - Rarely    What is the last grade level you completed in school? 12      Pre-Education Assessment   Patient understands the diabetes disease and treatment process. Comprehends key points    Patient understands incorporating nutritional management into lifestyle. Needs Review    Patient undertands incorporating physical activity into lifestyle. Comprehends key points    Patient understands using medications safely. Needs Review    Patient understands monitoring blood glucose, interpreting and using results Comprehends key points    Patient understands prevention, detection, and treatment of acute complications. Comprehends key points    Patient understands prevention, detection, and treatment of chronic complications. Compreheands key points    Patient understands how to develop strategies to address psychosocial issues. Comprehends key points    Patient understands how to develop strategies to promote health/change behavior. Comprehends key points      Complications   Last HgB A1C per patient/outside source 7.4 %    How often do you check your blood sugar? 0 times/day (not testing)    Number of hypoglycemic episodes per month --   no   Number of hyperglycemic episodes ( >200mg /dL): Rare    Can you tell when your blood sugar is high? No    Have  you had a dilated eye exam in the past 12 months? Yes    Have you had a dental exam in the past 12 months? Yes    Are you checking your feet? Yes    How many days per week are you checking your feet? 7      Dietary Intake   Breakfast ebb, toast, water, juice or fruit    Lunch tuna and crackers    Snack (afternoon) chips at time but not often    Warehouse manager, hgamburger, pork chop, potato and broccoli    Beverage(s) water mostly but some juice      Activity / Exercise   Activity / Exercise Type ADL's;Light (walking / raking leaves)    How many days per week do you exercise? 7    How many  minutes per day do you exercise? 30   stays busy, was walking with family, but when they stopped, she stopped   Total minutes per week of exercise 210      Patient Education   Previous Diabetes Education Yes    Healthy Eating Role of diet in the treatment of diabetes and the relationship between the three main macronutrients and blood glucose level;Plate Method   discussed high fiber diet for diabetes and constipation and more plant based foods and meals with less salt for kidney disease   Medications Reviewed patients medication for diabetes, action, purpose, timing of dose and side effects.    Monitoring --   offered CGM trila whwn she has a compatible phone, she liked the idea   Chronic complications Assessed and discussed foot care and prevention of foot problems      Individualized Goals (developed by patient)   Nutrition Follow meal plan discussed   increase fiber gradually     Post-Education Assessment   Patient understands incorporating nutritional management into lifestyle. Comprehends key points    Patient understands using medications safely. Comphrehends key points    Patient understands prevention, detection, and treatment of chronic complications. Comprehends key points      Outcomes   Expected Outcomes Demonstrated interest in learning. Expect positive outcomes    Future DMSE 6 months    Program Status Completed      Subsequent Visit   Since your last visit have you continued or begun to take your medications as prescribed? Yes    Since your last visit have you had your blood pressure checked? Yes    Is your most recent blood pressure lower, unchanged, or higher since your last visit? --   in target after second measurement   Since your last visit have you experienced any weight changes? Loss    Weight Loss (lbs) 20   in the past 3 years   Since your last visit, are you checking your blood glucose at least once a day? No   she has a meter, knows how to use it but has not  used it latley because she doe snot like to stick her finger         Individualized Plan for Diabetes Self-Management Training:   Learning Objective:  Patient will have a greater understanding of diabetes self-management. Patient education plan is to attend individual and/or group sessions per assessed needs and concerns.   Plan:   Patient Instructions  Thank you for your visit today!  Today we talked about:   Controlling blood pressure and blood sugar can help prevent complications like dialysis.  Making healthy foods choices (eating mostly foods  from plants and less salt) and moving every day as much as you can- taking a 30 minute walk- can help lower blood sugar and blood pressure, help your circulation and prevent constipation.    If you get a phone that is compatible with Dexcom G7, I am happy to give you a sample sensor and teach you how to use it.   Often, whole grain bread is a healthier choice than crackers.   Goals to work on:   Try to skip meat a meal or two a day. Check blood sugar about two hours after your largest meal of the day. Keep up the great job taking care of you! Please make a follow up in April 2026 or sooner  Increase fiber per handout.  Eat whole grain bread or whole grain crackers.   Please feel free to call me anytime.  Arland [phone]   Expected Outcomes:  Demonstrated interest in learning. Expect positive outcomes  Education material provided: Diabetes Resources  If problems or questions, patient to contact team via:  Phone  Future DSME appointment: 6 months Arland Hole, RD 06/26/2024 12:13 PM.

## 2024-06-26 NOTE — Patient Instructions (Addendum)
 Thank you for your visit today!  Today we talked about:   Controlling blood pressure and blood sugar can help prevent complications like dialysis.  Making healthy foods choices (eating mostly foods from plants and less salt) and moving every day as much as you can- taking a 30 minute walk- can help lower blood sugar and blood pressure, help your circulation and prevent constipation.    If you get a phone that is compatible with Dexcom G7, I am happy to give you a sample sensor and teach you how to use it.   Often, whole grain bread is a healthier choice than crackers.   Goals to work on:   Try to skip meat a meal or two a day. Check blood sugar about two hours after your largest meal of the day. Keep up the great job taking care of you! Please make a follow up in April 2026 or sooner  Increase fiber per handout.  Eat whole grain bread or whole grain crackers.   Please feel free to call me anytime.  Rhoderick Farrel [phone]

## 2024-06-26 NOTE — Patient Instructions (Addendum)
 Thank you, Ms.Gabriel CHRISTELLA Hurst for allowing us  to provide your care today. Today we discussed the following:  - For your foot pain, please AVOID taking medications in the NSAID class (ibuprofen , motrin , alleve, naproxen /naprosyn , celecoxib) as these can further worsen your kidney disease. - For your pain, please continue taking Tylenol , get plenty of rest with elevation of your foot, icing your foot, and avoid spending prolonged periods of time bearing weight on your foot. - We have ordered an x-ray that you can get completed at [facility].  I will call you with the results of your x-ray and next steps. - If your symptoms get suddenly worse or you start to experience fever/chills, please call our clinic.  I have ordered the following labs for you:  Lab Orders  No laboratory test(s) ordered today     Tests ordered today:  Right foot X-Ray  Referrals ordered today:   Referral Orders  No referral(s) requested today     I have ordered the following medication/changed the following medications:   Stop the following medications: There are no discontinued medications.   Start the following medications: No orders of the defined types were placed in this encounter.    Follow up: 4 weeks    Remember: If you start experiencing any fevers/chill or rapidly worsening right foot pain/swelling please call our clinic at the number below.  Should you have any questions or concerns please call the Internal Medicine Clinic at [phone].     Triva Hueber, MD [facility]

## 2024-06-26 NOTE — Progress Notes (Signed)
 Patient name: Madison Jefferson Date of birth: [birth year] Date of visit: 06/26/24  Type of visit: Acute Office Visit  Subjective   Chief concern:  Chief Complaint  Patient presents with   Follow-up     To foot pain ... Pt reports that  she told her PCP  last appt about to and it is now getting  numb  and hurting more     DENZIL BRISTOL is a 73 y.o. female with a PMHx of T2DM, CKD3 w/ proteinuria, fibromuscular dysplasia of bilateral carotids, asthma, HTN, HLD who presents to [facility] clinic for evaluation of acute foot pain.  The patient first experienced the pain 1 week ago in her right foot upon awakening in the morning.  She denies any sort of injury, trauma, fall that could have precipitated her foot pain.  She says she has tried Tylenol  and also taken 220 mg of naproxen  3 times in the past few days for the pain.  Her pain is much worse in the morning but also is exacerbated by weightbearing and walking.  In the meantime, she has also elevated her foot, rested, and soaked her foot in warm water.  She does note that the foot feels numb at times and also describes a sharp sensation of pain.  She states that she hurt her foot a few years ago in 2022 and completed an x-ray but was not told she had any fractures.  She denies any fevers/chills, shortness of breath, leg pain radiating to her calf.   Patient Active Problem List   Diagnosis Date Noted   Acute pain of right foot 06/26/2024   CKD (chronic kidney disease) stage 3, GFR 30-59 ml/min (HCC) 11/12/2023   Left-sided fourth cranial nerve palsy 04/06/2023   Fibromuscular dysplasia of both carotid arteries 12/29/2022   Healthcare maintenance 09/29/2022   Benign neoplasm of stomach 09/28/2022   HLD (hyperlipidemia) 02/02/2022   Gastric polyp    Right abducens nerve palsy 11/15/2019   Type 2 diabetes mellitus (HCC) 09/20/2013   Diverticulosis    Obesity, unspecified 10/12/2012   Allergic rhinitis 10/21/2009   Asthma 07/23/2009   Hypothyroidism  09/05/2008   Essential hypertension 09/25/2007     Past Surgical History:  Procedure Laterality Date   ABLATION     thyroid  with radioactive iodine ablation   CARPAL TUNNEL RELEASE     left wrist   CHOLECYSTECTOMY     COLONOSCOPY WITH PROPOFOL  N/A 01/28/2022   Procedure: COLONOSCOPY WITH PROPOFOL ;  Surgeon: Rollin Dover, MD;  Location: [facility] ENDOSCOPY;  Service: Gastroenterology;  Laterality: N/A;   ESOPHAGOGASTRODUODENOSCOPY (EGD) WITH PROPOFOL  N/A 01/28/2022   Procedure: ESOPHAGOGASTRODUODENOSCOPY (EGD) WITH PROPOFOL ;  Surgeon: Rollin Dover, MD;  Location: [facility] ENDOSCOPY;  Service: Gastroenterology;  Laterality: N/A;   ESOPHAGOGASTRODUODENOSCOPY (EGD) WITH PROPOFOL  N/A 01/29/2022   Procedure: ESOPHAGOGASTRODUODENOSCOPY (EGD) WITH PROPOFOL ;  Surgeon: Rollin Dover, MD;  Location: [facility] ENDOSCOPY;  Service: Gastroenterology;  Laterality: N/A;   HEMOSTASIS CLIP PLACEMENT  01/28/2022   Procedure: HEMOSTASIS CLIP PLACEMENT;  Surgeon: Rollin Dover, MD;  Location: [facility] ENDOSCOPY;  Service: Gastroenterology;;   HEMOSTASIS CLIP PLACEMENT  01/29/2022   Procedure: HEMOSTASIS CLIP PLACEMENT;  Surgeon: Rollin Dover, MD;  Location: [facility] ENDOSCOPY;  Service: Gastroenterology;;   HEMOSTASIS CONTROL  01/28/2022   Procedure: HEMOSTASIS CONTROL - EPI;  Surgeon: Rollin Dover, MD;  Location: [facility] ENDOSCOPY;  Service: Gastroenterology;;   LEFT HEART CATHETERIZATION WITH CORONARY ANGIOGRAM N/A 01/28/2012   Procedure: LEFT HEART CATHETERIZATION WITH CORONARY ANGIOGRAM;  Surgeon: Salena RAMAN  Claudene, MD;  Location: [facility] CATH LAB;  Service: Cardiovascular;  Laterality: N/A;   OTHER SURGICAL HISTORY     carpel tunnel surgery    OTHER SURGICAL HISTORY     carpel tunnel repair   POLYPECTOMY  01/28/2022   Procedure: POLYPECTOMY;  Surgeon: Rollin Dover, MD;  Location: [facility] ENDOSCOPY;  Service: Gastroenterology;;   POLYPECTOMY  01/29/2022   Procedure: POLYPECTOMY;  Surgeon: Rollin Dover, MD;  Location: [facility] ENDOSCOPY;  Service:  Gastroenterology;;   RIGHT HEART CATH  2005-Dr. Claudene   minimal CAD; left circumflex dominant with 10-15% proximal lesion;; normal LVF   TUBAL LIGATION     VAGINAL HYSTERECTOMY     partial hysterectomy 1983 vaginal bleeding    ROS negative unless otherwise indicated in the HPI or Assessment and Plan.  Current Outpatient Medications  Medication Instructions   albuterol  (VENTOLIN  HFA) 108 (90 Base) MCG/ACT inhaler INHALE 1 TO 2 PUFFS INTO THE LUNGS EVERY 6 HOURS AS NEEDED FOR WHEEZING OR SHORTNESS OF BREATH   aspirin  EC (ASPIRIN  LOW DOSE) 81 mg, Oral, Daily, Swallow whole.   Cholecalciferol  (VITAMIN D -3 PO) 1 capsule, Oral, Daily   dapagliflozin  propanediol (FARXIGA ) 5 mg, Oral, Daily before breakfast   glucose blood test strip Check sugars daily.   ibuprofen  (ADVIL ) 200 mg, Oral, Daily PRN   levothyroxine  (SYNTHROID ) 75 mcg, Oral, Daily before breakfast   loratadine  (CLARITIN ) 10 mg, Oral, Daily   metFORMIN  (GLUCOPHAGE -XR) 500 mg, Oral, 2 times daily with meals   metoprolol  succinate (TOPROL -XL) 100 MG 24 hr tablet TAKE 1 TABLET BY MOUTH EVERY DAY WITH OR IMMEDIATELY FOLLOWING A MEAL   Olmesartan -amLODIPine -HCTZ 40-10-25 MG TABS 1 tablet, Oral, Daily   polyethylene glycol (MIRALAX ) 17 g, Oral, Daily PRN   rosuvastatin  (CRESTOR ) 20 mg, Oral, Daily   senna (SENOKOT) 17.2 mg, Oral, Daily    Social History   Tobacco Use   Smoking status: Never   Smokeless tobacco: Never  Vaping Use   Vaping status: Never Used  Substance Use Topics   Alcohol use: No    Alcohol/week: 0.0 standard drinks of alcohol   Drug use: No      Objective  Today's Vitals   06/26/24 1014 06/26/24 1018  BP: (!) 151/55 137/83  Pulse: 65 (!) 59  Temp: 97.8 F (36.6 C)   TempSrc: Oral   SpO2: 99%   Weight: 170 lb (77.1 kg)   Height: 5' (1.524 m)   PainSc: 10-Worst pain ever   Body mass index is 33.2 kg/m.   Physical Exam: Constitutional: well-appearing, well-nourished; overweight; no acute  distress HENT: normocephalic atraumatic, mucous membranes moist Eyes: conjunctiva non-erythematous Cardiovascular: regular rate and rhythm, no m/r/g Pulmonary/Chest: normal work of breathing on room air, lungs CTAB Abdominal: soft, non-tender, non-distended MSK: normal bulk and tone Neurological: alert & oriented x 3, no focal deficit. No sensory deficits noted to bilateral lower extremities. Walks with a limp, favoring left foot. Skin: warm and dry Extremities: BLE edema noted to both feet with right worse than left. Mild poorly demarcated erythema noted to dorsum of left foot. No tenderness to palpation of medial or lateral malleolus. Tender to palpation at base of right 5th metatarsal. Psych: normal mood and behavior      Assessment & Plan  Acute pain of right foot Assessment & Plan: Patient complaining of acute right foot pain and swelling for the past 1 week.  History of similar pain in 2022 with x-ray showing soft tissue swelling.  On exam, she does have  tenderness to palpation of base of right fifth metatarsal.  No tenderness to lateral or medial malleoli.  There is poorly demarcated erythema to the dorsum of the right foot without skin puncture or obvious lesion/deformity.  Right foot edema worse than left foot.  Given the tenderness on exam as well as pain with weightbearing, will obtain x-ray of right foot to rule out fracture of metatarsal.  Doubt that she has any ankle pathology given lack of tenderness to either malleoli.  Educated the patient to avoid NSAIDs given her kidney disease and use Tylenol  instead.  Also advised the patient to stay off her feet, get plenty of rest, and use ice.  Educate the patient on strict precautions and when to call the clinic or seek emergency care. - Right foot x-ray pending - Follow-up in 4 weeks for right foot pain unless acutely worsens - Avoid NSAIDs - Tylenol  as needed for pain - Rest, ice, elevation  Orders: -     DG Foot Complete Right;  Future    Return in about 4 weeks (around 07/24/2024) for right foot pain follow up.   Patient case discussed with Dr. Lovie, who also saw and evaluated the patient.  Vernette Moise, MD [facility]  PGY-1 06/26/2024, 11:45 AM

## 2024-06-26 NOTE — Assessment & Plan Note (Signed)
 Patient complaining of acute right foot pain and swelling for the past 1 week.  History of similar pain in 2022 with x-ray showing soft tissue swelling.  On exam, she does have tenderness to palpation of base of right fifth metatarsal.  No tenderness to lateral or medial malleoli.  There is poorly demarcated erythema to the dorsum of the right foot without skin puncture or obvious lesion/deformity.  Right foot edema worse than left foot.  Given the tenderness on exam as well as pain with weightbearing, will obtain x-ray of right foot to rule out fracture of metatarsal.  Doubt that she has any ankle pathology given lack of tenderness to either malleoli.  Educated the patient to avoid NSAIDs given her kidney disease and use Tylenol  instead.  Also advised the patient to stay off her feet, get plenty of rest, and use ice.  Educate the patient on strict precautions and when to call the clinic or seek emergency care. - Right foot x-ray pending - Follow-up in 4 weeks for right foot pain unless acutely worsens - Avoid NSAIDs - Tylenol  as needed for pain - Rest, ice, elevation

## 2024-06-27 NOTE — Progress Notes (Signed)
Internal Medicine Clinic Attending  I was physically present during the key portions of the resident provided service and participated in the medical decision making of patient's management care. I reviewed pertinent patient test results.  The assessment, diagnosis, and plan were formulated together and I agree with the documentation in the resident's note.  Mercie Eon, MD

## 2024-07-02 ENCOUNTER — Ambulatory Visit: Payer: Self-pay

## 2024-07-02 ENCOUNTER — Telehealth: Payer: Self-pay | Admitting: *Deleted

## 2024-07-02 NOTE — Telephone Encounter (Signed)
 Unsure who called the pt. I called the pt back; no answer - unable to leave a message d/t vm not being set up.

## 2024-07-02 NOTE — Telephone Encounter (Signed)
 Copied from CRM #[id number]. Topic: Clinical - Lab/Test Results >> Jul 02, 2024  2:13 PM Suzette B wrote: Reason for CRM: Patient states that someone called her from the office in regards to some labs results and imaging. Please call patient to advise. I didn't not see any notes in the chart about phone call to patient please call at [phone] Gabriel HERO. Shona

## 2024-07-24 ENCOUNTER — Ambulatory Visit: Admitting: Student

## 2024-07-27 ENCOUNTER — Telehealth: Payer: Self-pay | Admitting: *Deleted

## 2024-07-27 DIAGNOSIS — I10 Essential (primary) hypertension: Secondary | ICD-10-CM

## 2024-07-27 MED ORDER — METOPROLOL SUCCINATE ER 100 MG PO TB24: ORAL_TABLET | ORAL | 1 refills | Status: AC

## 2024-07-27 NOTE — Telephone Encounter (Signed)
 Med refill

## 2024-08-13 ENCOUNTER — Telehealth: Payer: Self-pay | Admitting: *Deleted

## 2024-08-13 MED ORDER — MOMETASONE FUROATE 50 MCG/ACT NA SUSP: 2.0000 | Freq: Every day | NASAL | 12 refills | Status: AC

## 2024-08-13 NOTE — Telephone Encounter (Signed)
 Received faxed refill request for mometasone  50mcg nasal spray-medication no longer on active medication list CMA attempted to contact pt address need-no answer, unable to leave message.   Will send request to pcp

## 2024-09-04 ENCOUNTER — Other Ambulatory Visit: Payer: Self-pay

## 2024-09-04 DIAGNOSIS — E1169 Type 2 diabetes mellitus with other specified complication: Secondary | ICD-10-CM

## 2024-09-04 NOTE — Telephone Encounter (Signed)
 Medication sent to pharmacy

## 2024-09-12 ENCOUNTER — Ambulatory Visit

## 2025-03-20 ENCOUNTER — Ambulatory Visit
